# Patient Record
Sex: Female | Born: 1940 | Race: White | Hispanic: No | Marital: Married | Smoking: Former smoker
Health system: Southern US, Community
[De-identification: ages and names within clinical notes are randomized; demographics above are authoritative.]

## PROBLEM LIST (undated history)

## (undated) DIAGNOSIS — K529 Noninfective gastroenteritis and colitis, unspecified: Secondary | ICD-10-CM

## (undated) DIAGNOSIS — K589 Irritable bowel syndrome without diarrhea: Secondary | ICD-10-CM

## (undated) DIAGNOSIS — K649 Unspecified hemorrhoids: Secondary | ICD-10-CM

## (undated) DIAGNOSIS — M1990 Unspecified osteoarthritis, unspecified site: Secondary | ICD-10-CM

## (undated) DIAGNOSIS — G4700 Insomnia, unspecified: Secondary | ICD-10-CM

## (undated) DIAGNOSIS — M810 Age-related osteoporosis without current pathological fracture: Secondary | ICD-10-CM

## (undated) DIAGNOSIS — E0590 Thyrotoxicosis, unspecified without thyrotoxic crisis or storm: Secondary | ICD-10-CM

## (undated) DIAGNOSIS — E559 Vitamin D deficiency, unspecified: Secondary | ICD-10-CM

## (undated) DIAGNOSIS — E010 Iodine-deficiency related diffuse (endemic) goiter: Secondary | ICD-10-CM

## (undated) DIAGNOSIS — I5032 Chronic diastolic (congestive) heart failure: Secondary | ICD-10-CM

## (undated) DIAGNOSIS — I272 Pulmonary hypertension, unspecified: Secondary | ICD-10-CM

## (undated) DIAGNOSIS — A499 Bacterial infection, unspecified: Secondary | ICD-10-CM

## (undated) DIAGNOSIS — I4819 Other persistent atrial fibrillation: Secondary | ICD-10-CM

## (undated) DIAGNOSIS — I517 Cardiomegaly: Secondary | ICD-10-CM

## (undated) DIAGNOSIS — O039 Complete or unspecified spontaneous abortion without complication: Secondary | ICD-10-CM

## (undated) DIAGNOSIS — N390 Urinary tract infection, site not specified: Secondary | ICD-10-CM

## (undated) HISTORY — DX: Noninfective gastroenteritis and colitis, unspecified: K52.9

## (undated) HISTORY — DX: Iodine-deficiency related diffuse (endemic) goiter: E01.0

## (undated) HISTORY — PX: WISDOM TOOTH EXTRACTION: SHX21

## (undated) HISTORY — DX: Urinary tract infection, site not specified: N39.0

## (undated) HISTORY — DX: Irritable bowel syndrome, unspecified: K58.9

## (undated) HISTORY — PX: TONSILLECTOMY: SUR1361

## (undated) HISTORY — DX: Unspecified hemorrhoids: K64.9

## (undated) HISTORY — DX: Vitamin D deficiency, unspecified: E55.9

## (undated) HISTORY — PX: DILATION AND CURETTAGE OF UTERUS: SHX78

## (undated) HISTORY — PX: CATARACT EXTRACTION: SUR2

## (undated) HISTORY — DX: Cardiomegaly: I51.7

## (undated) HISTORY — PX: GANGLION CYST EXCISION: SHX1691

## (undated) HISTORY — DX: Complete or unspecified spontaneous abortion without complication: O03.9

## (undated) HISTORY — DX: Age-related osteoporosis without current pathological fracture: M81.0

## (undated) HISTORY — DX: Bacterial infection, unspecified: A49.9

---

## 1999-09-15 ENCOUNTER — Encounter: Admission: RE | Admit: 1999-09-15 | Discharge: 1999-09-15 | Payer: Self-pay | Admitting: Family Medicine

## 1999-09-15 ENCOUNTER — Encounter: Payer: Self-pay | Admitting: Family Medicine

## 2000-10-16 ENCOUNTER — Encounter: Payer: Self-pay | Admitting: Family Medicine

## 2000-10-16 ENCOUNTER — Encounter: Admission: RE | Admit: 2000-10-16 | Discharge: 2000-10-16 | Payer: Self-pay | Admitting: Family Medicine

## 2001-11-19 ENCOUNTER — Encounter: Admission: RE | Admit: 2001-11-19 | Discharge: 2001-11-19 | Payer: Self-pay | Admitting: Family Medicine

## 2001-11-19 ENCOUNTER — Encounter: Payer: Self-pay | Admitting: Family Medicine

## 2003-02-23 ENCOUNTER — Encounter: Payer: Self-pay | Admitting: Family Medicine

## 2003-02-23 ENCOUNTER — Encounter: Admission: RE | Admit: 2003-02-23 | Discharge: 2003-02-23 | Payer: Self-pay | Admitting: Family Medicine

## 2005-08-08 ENCOUNTER — Encounter: Admission: RE | Admit: 2005-08-08 | Discharge: 2005-08-08 | Payer: Self-pay | Admitting: Family Medicine

## 2011-10-31 ENCOUNTER — Other Ambulatory Visit: Payer: Self-pay | Admitting: Orthopedic Surgery

## 2011-11-07 ENCOUNTER — Encounter (HOSPITAL_BASED_OUTPATIENT_CLINIC_OR_DEPARTMENT_OTHER): Payer: Self-pay | Admitting: *Deleted

## 2011-11-07 NOTE — Progress Notes (Signed)
Healthy lady-no labs needed

## 2011-11-09 ENCOUNTER — Encounter (HOSPITAL_BASED_OUTPATIENT_CLINIC_OR_DEPARTMENT_OTHER): Payer: Self-pay | Admitting: *Deleted

## 2011-11-09 ENCOUNTER — Encounter (HOSPITAL_BASED_OUTPATIENT_CLINIC_OR_DEPARTMENT_OTHER)
Admission: RE | Disposition: A | Discharge status: Home / Self Care | Payer: Self-pay | Source: Ambulatory Visit | Attending: Orthopedic Surgery

## 2011-11-09 ENCOUNTER — Encounter (HOSPITAL_BASED_OUTPATIENT_CLINIC_OR_DEPARTMENT_OTHER): Payer: Self-pay | Admitting: Anesthesiology

## 2011-11-09 ENCOUNTER — Encounter (HOSPITAL_BASED_OUTPATIENT_CLINIC_OR_DEPARTMENT_OTHER): Payer: Self-pay | Admitting: Orthopedic Surgery

## 2011-11-09 ENCOUNTER — Ambulatory Visit (HOSPITAL_BASED_OUTPATIENT_CLINIC_OR_DEPARTMENT_OTHER)
Admission: RE | Admit: 2011-11-09 | Discharge: 2011-11-09 | Disposition: A | Discharge status: Home / Self Care | Payer: Medicare Other | Source: Ambulatory Visit | Attending: Orthopedic Surgery | Admitting: Orthopedic Surgery

## 2011-11-09 ENCOUNTER — Ambulatory Visit (HOSPITAL_BASED_OUTPATIENT_CLINIC_OR_DEPARTMENT_OTHER): Payer: Medicare Other | Admitting: Anesthesiology

## 2011-11-09 DIAGNOSIS — M546 Pain in thoracic spine: Secondary | ICD-10-CM | POA: Insufficient documentation

## 2011-11-09 HISTORY — PX: EAR CYST EXCISION: SHX22

## 2011-11-09 HISTORY — DX: Unspecified osteoarthritis, unspecified site: M19.90

## 2011-11-09 HISTORY — DX: Insomnia, unspecified: G47.00

## 2011-11-09 LAB — POCT HEMOGLOBIN-HEMACUE: Hemoglobin: 14.4 g/dL (ref 12.0–15.0)

## 2011-11-09 SURGERY — CYST REMOVAL
Anesthesia: General | Laterality: Right | Wound class: Clean

## 2011-11-09 MED ORDER — FENTANYL CITRATE 0.05 MG/ML IJ SOLN: 25.0000 ug | INTRAMUSCULAR | Status: DC | PRN

## 2011-11-09 MED ORDER — DEXAMETHASONE SODIUM PHOSPHATE 10 MG/ML IJ SOLN
INTRAMUSCULAR | Status: DC | PRN
  Administered 2011-11-09: 10 mg via INTRAVENOUS

## 2011-11-09 MED ORDER — PROPOFOL 10 MG/ML IV EMUL
INTRAVENOUS | Status: DC | PRN
  Administered 2011-11-09: 200 mg via INTRAVENOUS
  Administered 2011-11-09: 4 mg via INTRAVENOUS

## 2011-11-09 MED ORDER — METOCLOPRAMIDE HCL 5 MG/ML IJ SOLN: 10.0000 mg | Freq: Once | INTRAMUSCULAR | Status: DC | PRN

## 2011-11-09 MED ORDER — OXYCODONE HCL 5 MG PO TABS: 5.0000 mg | ORAL_TABLET | Freq: Once | ORAL | Status: DC | PRN

## 2011-11-09 MED ORDER — BUPIVACAINE HCL (PF) 0.25 % IJ SOLN
INTRAMUSCULAR | Status: DC | PRN
  Administered 2011-11-09: 10 mL

## 2011-11-09 MED ORDER — METOCLOPRAMIDE HCL 5 MG/ML IJ SOLN
INTRAMUSCULAR | Status: DC | PRN
  Administered 2011-11-09: 10 mg via INTRAVENOUS

## 2011-11-09 MED ORDER — CEFAZOLIN SODIUM 1-5 GM-% IV SOLN: 1.0000 g | INTRAVENOUS | Status: DC

## 2011-11-09 MED ORDER — LACTATED RINGERS IV SOLN
INTRAVENOUS | Status: DC
  Administered 2011-11-09 (×2): via INTRAVENOUS

## 2011-11-09 MED ORDER — LIDOCAINE HCL (CARDIAC) 20 MG/ML IV SOLN
INTRAVENOUS | Status: DC | PRN
  Administered 2011-11-09: 60 mg via INTRAVENOUS

## 2011-11-09 MED ORDER — MIDAZOLAM HCL 5 MG/5ML IJ SOLN
INTRAMUSCULAR | Status: DC | PRN
  Administered 2011-11-09: 2 mg via INTRAVENOUS

## 2011-11-09 MED ORDER — CHLORHEXIDINE GLUCONATE 4 % EX LIQD: 60.0000 mL | Freq: Once | CUTANEOUS | Status: DC

## 2011-11-09 MED ORDER — HYDROCODONE-ACETAMINOPHEN 5-325 MG PO TABS: ORAL_TABLET | ORAL | Status: DC

## 2011-11-09 MED ORDER — EPHEDRINE SULFATE 50 MG/ML IJ SOLN
INTRAMUSCULAR | Status: DC | PRN
  Administered 2011-11-09 (×2): 5 mg via INTRAVENOUS

## 2011-11-09 MED ORDER — FENTANYL CITRATE 0.05 MG/ML IJ SOLN
INTRAMUSCULAR | Status: DC | PRN
  Administered 2011-11-09: 50 ug via INTRAVENOUS

## 2011-11-09 MED ORDER — ACETAMINOPHEN 10 MG/ML IV SOLN
1000.0000 mg | Freq: Once | INTRAVENOUS | Status: AC
  Administered 2011-11-09: 1000 mg via INTRAVENOUS

## 2011-11-09 SURGICAL SUPPLY — 53 items
BANDAGE COBAN STERILE 2 (GAUZE/BANDAGES/DRESSINGS) IMPLANT
BANDAGE CONFORM 2  STR LF (GAUZE/BANDAGES/DRESSINGS) ×2 IMPLANT
BANDAGE ELASTIC 3 VELCRO ST LF (GAUZE/BANDAGES/DRESSINGS) IMPLANT
BANDAGE GAUZE ELAST BULKY 4 IN (GAUZE/BANDAGES/DRESSINGS) IMPLANT
BANDAGE GAUZE STRT 1 STR LF (GAUZE/BANDAGES/DRESSINGS) ×2 IMPLANT
BENZOIN TINCTURE PRP APPL 2/3 (GAUZE/BANDAGES/DRESSINGS) IMPLANT
BLADE MINI RND TIP GREEN BEAV (BLADE) IMPLANT
BLADE SURG 15 STRL LF DISP TIS (BLADE) ×2 IMPLANT
BLADE SURG 15 STRL SS (BLADE) ×2
BNDG CMPR 9X4 STRL LF SNTH (GAUZE/BANDAGES/DRESSINGS) ×1
BNDG CMPR MD 5X2 ELC HKLP STRL (GAUZE/BANDAGES/DRESSINGS)
BNDG COHESIVE 1X5 TAN STRL LF (GAUZE/BANDAGES/DRESSINGS) ×2 IMPLANT
BNDG ELASTIC 2 VLCR STRL LF (GAUZE/BANDAGES/DRESSINGS) IMPLANT
BNDG ESMARK 4X9 LF (GAUZE/BANDAGES/DRESSINGS) ×2 IMPLANT
BNDG PLASTER X FAST 3X3 WHT LF (CAST SUPPLIES) IMPLANT
BNDG PLSTR 9X3 FST ST WHT (CAST SUPPLIES)
CHLORAPREP W/TINT 26ML (MISCELLANEOUS) ×2 IMPLANT
CLOTH BEACON ORANGE TIMEOUT ST (SAFETY) ×2 IMPLANT
CORDS BIPOLAR (ELECTRODE) ×2 IMPLANT
COVER MAYO STAND STRL (DRAPES) ×2 IMPLANT
COVER TABLE BACK 60X90 (DRAPES) ×2 IMPLANT
CUFF TOURNIQUET SINGLE 18IN (TOURNIQUET CUFF) ×2 IMPLANT
DRAPE EXTREMITY T 121X128X90 (DRAPE) ×2 IMPLANT
DRAPE SURG 17X23 STRL (DRAPES) ×2 IMPLANT
GAUZE XEROFORM 1X8 LF (GAUZE/BANDAGES/DRESSINGS) ×2 IMPLANT
GLOVE BIO SURGEON STRL SZ 6.5 (GLOVE) ×2 IMPLANT
GLOVE BIO SURGEON STRL SZ7.5 (GLOVE) ×4 IMPLANT
GLOVE INDICATOR 7.0 STRL GRN (GLOVE) ×2 IMPLANT
GOWN PREVENTION PLUS XLARGE (GOWN DISPOSABLE) ×4 IMPLANT
GOWN STRL REIN XL XLG (GOWN DISPOSABLE) IMPLANT
NEEDLE HYPO 25X1 1.5 SAFETY (NEEDLE) ×2 IMPLANT
NS IRRIG 1000ML POUR BTL (IV SOLUTION) ×2 IMPLANT
PACK BASIN DAY SURGERY FS (CUSTOM PROCEDURE TRAY) ×2 IMPLANT
PAD CAST 3X4 CTTN HI CHSV (CAST SUPPLIES) IMPLANT
PAD CAST 4YDX4 CTTN HI CHSV (CAST SUPPLIES) IMPLANT
PADDING CAST ABS 4INX4YD NS (CAST SUPPLIES) ×1
PADDING CAST ABS COTTON 4X4 ST (CAST SUPPLIES) ×1 IMPLANT
PADDING CAST COTTON 3X4 STRL (CAST SUPPLIES)
PADDING CAST COTTON 4X4 STRL (CAST SUPPLIES)
SPLINT FNGR PLAIN END 5/8X3.25 (CAST SUPPLIES) ×1 IMPLANT
SPLINT PLASTALUME 3 1/4 (CAST SUPPLIES) ×2
SPONGE GAUZE 4X4 12PLY (GAUZE/BANDAGES/DRESSINGS) ×4 IMPLANT
STOCKINETTE 4X48 STRL (DRAPES) ×2 IMPLANT
STRIP CLOSURE SKIN 1/2X4 (GAUZE/BANDAGES/DRESSINGS) IMPLANT
SUT ETHILON 3 0 PS 1 (SUTURE) IMPLANT
SUT ETHILON 4 0 PS 2 18 (SUTURE) ×2 IMPLANT
SUT ETHILON 5 0 P 3 18 (SUTURE) ×1
SUT NYLON ETHILON 5-0 P-3 1X18 (SUTURE) ×1 IMPLANT
SYR BULB 3OZ (MISCELLANEOUS) ×2 IMPLANT
SYR CONTROL 10ML LL (SYRINGE) ×2 IMPLANT
TOWEL OR 17X24 6PK STRL BLUE (TOWEL DISPOSABLE) ×4 IMPLANT
UNDERPAD 30X30 INCONTINENT (UNDERPADS AND DIAPERS) ×2 IMPLANT
WATER STERILE IRR 1000ML POUR (IV SOLUTION) IMPLANT

## 2011-11-09 NOTE — Anesthesia Procedure Notes (Signed)
Procedure Name: LMA Insertion Date/Time: 11/09/2011 8:59 AM Performed by: Burna Cash Pre-anesthesia Checklist: Patient identified, Emergency Drugs available, Suction available and Patient being monitored Patient Re-evaluated:Patient Re-evaluated prior to inductionOxygen Delivery Method: Circle System Utilized Preoxygenation: Pre-oxygenation with 100% oxygen Intubation Type: IV induction Ventilation: Mask ventilation without difficulty LMA: LMA inserted LMA Size: 4.0 Number of attempts: 1 Airway Equipment and Method: bite block Placement Confirmation: positive ETCO2 Tube secured with: Tape Dental Injury: Teeth and Oropharynx as per pre-operative assessment

## 2011-11-09 NOTE — Op Note (Signed)
Dictation [phone]

## 2011-11-09 NOTE — Transfer of Care (Signed)
Immediate Anesthesia Transfer of Care Note  Patient: Lynn Hayden  Procedure(s) Performed: Procedure(s) (LRB): CYST REMOVAL (Right)  Patient Location: PACU  Anesthesia Type: General  Level of Consciousness: sedated  Airway & Oxygen Therapy: Patient Spontanous Breathing and Patient connected to face mask oxygen  Post-op Assessment: Report given to PACU RN and Post -op Vital signs reviewed and stable  Post vital signs: Reviewed and stable  Complications: No apparent anesthesia complications

## 2011-11-09 NOTE — Anesthesia Postprocedure Evaluation (Signed)
Anesthesia Post Note  Patient: Lynn Hayden  Procedure(s) Performed: Procedure(s) (LRB): CYST REMOVAL (Right)  Anesthesia type: General  Patient location: PACU  Post pain: Pain level controlled  Post assessment: Patient's Cardiovascular Status Stable  Last Vitals:  Filed Vitals:   11/09/11 1055  BP: 138/72  Pulse: 62  Temp: 36.4 C  Resp: 16    Post vital signs: Reviewed and stable  Level of consciousness: alert  Complications: No apparent anesthesia complications

## 2011-11-09 NOTE — Op Note (Signed)
NAME:  Lynn Hayden, Lynn Hayden                ACCOUNT NO.:  [id number]  MEDICAL RECORD NO.:  [id number]  LOCATION:                                 FACILITY:  PHYSICIAN:  Betha Loa, MD             DATE OF BIRTH:  DATE OF PROCEDURE:  11/09/2011 DATE OF DISCHARGE:                              OPERATIVE REPORT   PREOPERATIVE DIAGNOSIS:  Right index finger retained foreign body versus mucoid cyst.  POSTOPERATIVE DIAGNOSIS:  Right index finger mucoid cyst.  PROCEDURE:  Excision of mucoid cyst and debridement of DIP joint.  SURGEON:  Betha Loa, M.D.  ASSISTANT:  None.  ANESTHESIA:  General.  IV FLUIDS:  Per anesthesia flow sheet.  ESTIMATED BLOOD LOSS:  Minimal.  COMPLICATIONS:  None.  SPECIMENS:  Mucoid cyst to Pathology.  TOURNIQUET TIME:  36 minutes.  DISPOSITION:  Stable to PACU.  INDICATIONS:  Ms. Lynn Hayden is a 71 year old female who for approximately 10 months has had a sensation of foreign body in the index finger near the radial base of her nail.  She states that she poked it on the strainer under sink at one time and it has been aggravated since.  She is followed up in the office multiple times for evaluation and it shown an inflamed area of the cuticle.  Ultrasound and CT had been unsuccessful on revealing foreign body.  We discussed nonoperative and operative options.  She wished to have a surgical exploration for potential removal of foreign body versus mucoid cyst with debridement of the DIP joint.  Risks, benefits, and alternatives of surgery were discussed including the risk of blood loss; infection; damage to nerves, vessels, tendons, ligaments, bone; failure of procedure; need for additional procedure; complications with wound healing; continued pain; and retained foreign body.  She voiced understanding of these risks and elected to proceed.  OPERATIVE COURSE:  After being identified preoperatively by myself, the patient and I agreed upon the procedure and site  of procedure.  Surgical site was marked.  Risks, benefits, and alternatives of surgery were reviewed and she wished to proceed.  A surgical consent had been signed. She was given 1 g of IV Ancef as preoperative antibiotic prophylaxis due to her amoxicillin allergy.  She was transferred to the operating room and placed on the operating room table in supine position with right upper extremity on arm board.  General anesthesia was induced by the anesthesiologist.  Right upper extremity was prepped and draped in normal sterile orthopedic fashion.  A surgical pause was performed between the surgeons, anesthesia and operating room staff, and all were in agreement as to the patient, procedure, and site of procedure. Tourniquet at the proximal aspect of the forearm was inflated to 250 mmHg after exsanguination of the limb with Esmarch bandage.  A hockey- stick shaped incision was made at the DIP joint on the radial side. This carried into subcutaneous tissues by spreading technique.  Bipolar electrocautery was used to obtain hemostasis.  The tissues at the radial side of the distal phalanx were elevated.  They were very patulous within.  It appeared that they were cystic.  This tissue was  removed and sent to Pathology for examination.  No foreign body was able to be identified.  The extensor tendon was elevated.  The DIP joint was debrided using the small rongeurs.  There was a large osteophyte on the dorsum of the distal phalanx, it was removed.  The insertion of the extensor tendon was preserved.  The wounds and a joint were copiously irrigated with sterile saline.  It was closed with 5-0 nylon in an horizontal mattress fashion.  The finger was injected with 10 mL of 0.25% plain Marcaine as a digital block to aid in postoperative analgesia.  The wound was then dressed with sterile Xeroform, 4x4, and wrapped with a Kling.  An AlumaFoam splint was placed and wrapped with Kling and a Coban  dressing lightly.  Tourniquet was deflated at 36 minutes.  The operative drapes were broken down.  The patient was awoken from anesthesia safely.  She was transferred back to stretcher and taken to PACU in stable condition.  I will see her back in the office in 1 week for postoperative followup.  I will give her Norco 5/325, 1-2 p.o. q.6 hours p.r.n. pain, dispensed #40.     Betha Loa, MD     KK/MEDQ  D:  11/09/2011  T:  11/09/2011  Job:  [id number]

## 2011-11-09 NOTE — Anesthesia Preprocedure Evaluation (Signed)
Anesthesia Evaluation  Patient identified by MRN, date of birth, ID band Patient awake    Reviewed: Allergy & Precautions, H&P , NPO status , Patient's Chart, lab work & pertinent test results, reviewed documented beta blocker date and time   History of Anesthesia Complications (+) PONV  Airway Mallampati: II TM Distance: >3 FB Neck ROM: full    Dental   Pulmonary neg pulmonary ROS,          Cardiovascular negative cardio ROS      Neuro/Psych negative neurological ROS  negative psych ROS   GI/Hepatic negative GI ROS, Neg liver ROS,   Endo/Other  negative endocrine ROS  Renal/GU negative Renal ROS  negative genitourinary   Musculoskeletal   Abdominal   Peds  Hematology negative hematology ROS (+)   Anesthesia Other Findings See surgeon's H&P   Reproductive/Obstetrics negative OB ROS                           Anesthesia Physical Anesthesia Plan  ASA: I  Anesthesia Plan: General   Post-op Pain Management:    Induction: Intravenous  Airway Management Planned: LMA  Additional Equipment:   Intra-op Plan:   Post-operative Plan: Extubation in OR  Informed Consent: I have reviewed the patients History and Physical, chart, labs and discussed the procedure including the risks, benefits and alternatives for the proposed anesthesia with the patient or authorized representative who has indicated his/her understanding and acceptance.   Dental Advisory Given  Plan Discussed with: CRNA and Surgeon  Anesthesia Plan Comments:         Anesthesia Quick Evaluation  

## 2011-11-09 NOTE — Discharge Instructions (Addendum)

## 2011-11-09 NOTE — H&P (Signed)
  Lynn Hayden is an 71 y.o. female.   Chief Complaint: right index foreign body vs mucoid cyst HPI: 71 yo female with sensation of foreign body right index finger by nail.  Poked with piece of metal from sink strainer ten months ago.  Unable to locate foreign body on u/s or ct.  Continued pain in soft tissue at radial side of base of nail.  Past Medical History  Diagnosis Date  . PONV (postoperative nausea and vomiting)   . Insomnia   . Arthritis     Past Surgical History  Procedure Date  . Tonsillectomy   . Dilation and curettage of uterus   . Wisdom tooth extraction   . Ganglion cyst excision     rt wrist    No family history on file. Social History:  reports that she quit smoking about 23 years ago. She does not have any smokeless tobacco history on file. She reports that she drinks alcohol. She reports that she does not use illicit drugs.  Allergies:  Allergies  Allergen Reactions  . Amoxicillin Rash    Medications Prior to Admission  Medication Sig Dispense Refill  . calcium carbonate 1250 MG capsule Take 1,250 mg by mouth 2 (two) times daily with a meal.      . cholecalciferol (VITAMIN D) 1000 UNITS tablet Take 1,000 Units by mouth daily. Takes 2      . temazepam (RESTORIL) 30 MG capsule Take 30 mg by mouth at bedtime as needed.        Results for orders placed during the hospital encounter of 11/09/11 (from the past 48 hour(s))  POCT HEMOGLOBIN-HEMACUE     Status: Normal   Collection Time   11/09/11  8:16 AM      Component Value Range Comment   Hemoglobin 14.4  12.0 - 15.0 (g/dL)     No results found.   A comprehensive review of systems was negative except for: Eyes: positive for contacts/glasses Ears, nose, mouth, throat, and face: positive for tinnitus  Blood pressure 159/83, pulse 58, temperature 97.6 F (36.4 C), temperature source Oral, resp. rate 16, height 5\' 4"  (1.626 m), weight 54.432 kg (120 lb), SpO2 99.00%.  General appearance: alert,  cooperative and appears stated age Head: Normocephalic, without obvious abnormality, atraumatic Neck: supple, symmetrical, trachea midline Resp: clear to auscultation bilaterally Cardio: regular rate and rhythm GI: soft, non-tender; bowel sounds normal; no masses,  no organomegaly Extremities: light touch sensation and capillary refill intact all digits.  +epl/fpl/io.  radial side of right index by base of nail inflammed. Pulses: 2+ and symmetric Skin: Skin color, texture, turgor normal. No rashes or lesions Neurologic: Grossly normal Incision/Wound: na  Assessment/Plan Discussed possibility of retained foreign body vs mucoid cyst and non operative and operative treatment options.  She wishes to have operative treatment for attempted removal of foreign body vs mucoid cyst with dip joint debridement.  Risks, benefits, and alternatives of surgery were discussed and the patient agrees with the plan of care.   Amrom Ore R 11/09/2011, 8:46 AM

## 2011-11-10 ENCOUNTER — Encounter (HOSPITAL_BASED_OUTPATIENT_CLINIC_OR_DEPARTMENT_OTHER): Payer: Self-pay | Admitting: Orthopedic Surgery

## 2011-11-10 NOTE — Brief Op Note (Signed)
Follow up phone call made. Pt c/o sore on upper lip from intubation yesterday and want to know if she could treat it with salt like she does when she gets a cold sore which works for her. I said that would be ok and offered to have one the anesthesia doctors call, she said they was no need of that.

## 2011-11-15 ENCOUNTER — Encounter (HOSPITAL_BASED_OUTPATIENT_CLINIC_OR_DEPARTMENT_OTHER): Payer: Self-pay

## 2012-03-21 ENCOUNTER — Ambulatory Visit: Payer: Medicare Other | Admitting: Cardiology

## 2012-03-22 ENCOUNTER — Ambulatory Visit (INDEPENDENT_AMBULATORY_CARE_PROVIDER_SITE_OTHER): Payer: Medicare Other | Admitting: Cardiology

## 2012-03-22 ENCOUNTER — Encounter: Payer: Self-pay | Admitting: Cardiology

## 2012-03-22 VITALS — BP 141/75 | HR 57 | Ht 64.0 in | Wt 119.8 lb

## 2012-03-22 DIAGNOSIS — R0789 Other chest pain: Secondary | ICD-10-CM

## 2012-03-22 DIAGNOSIS — R079 Chest pain, unspecified: Secondary | ICD-10-CM | POA: Insufficient documentation

## 2012-03-22 DIAGNOSIS — R011 Cardiac murmur, unspecified: Secondary | ICD-10-CM | POA: Insufficient documentation

## 2012-03-22 NOTE — Patient Instructions (Addendum)
Your physician recommends that you continue on your current medications as directed. Please refer to the Current Medication list given to you today.  Your physician has requested that you have an exercise tolerance test POET. For further information please visit [email or web address] Please also follow instruction sheet, as given.

## 2012-03-22 NOTE — Progress Notes (Signed)
HPI Murmur, no diastolic murmurs the patient presents for evaluation of chest discomfort and a heart murmur. She had an evaluation for this several years ago apparently with an echocardiogram that demonstrated no significant abnormalities and she reports an exercise treadmill test. She does get discomfort with activity such as playing basketball or walking up an incline. This is a left sided discomfort without radiation to the jaw or to the arms. There are no associated symptoms. He is a stable pattern. She has no shortness of breath, PND or orthopnea. She does not work palpitations, presyncope or syncope.  Allergies  Allergen Reactions  . Amoxicillin Rash    Current Outpatient Prescriptions  Medication Sig Dispense Refill  . calcium carbonate 1250 MG capsule Take 1,250 mg by mouth 2 (two) times daily with a meal.      . cholecalciferol (VITAMIN D) 1000 UNITS tablet Take 1,000 Units by mouth daily. Takes 2      . temazepam (RESTORIL) 30 MG capsule Take 30 mg by mouth at bedtime as needed.        Past Medical History  Diagnosis Date  . PONV (postoperative nausea and vomiting)   . Insomnia   . Arthritis     Past Surgical History  Procedure Date  . Tonsillectomy   . Dilation and curettage of uterus   . Wisdom tooth extraction   . Ganglion cyst excision     rt wrist  . Ear cyst excision 11/09/2011    Procedure: CYST REMOVAL;  Surgeon: Tami Ribas, MD;  Location: [facility];  Service: Orthopedics;  Laterality: Right;  right index excision cyst/foreign body and debridement DIP joint    Family history:  No early CAD  History   Social History  . Marital Status: Married    Spouse Name: N/A    Number of Children: N/A  . Years of Education: N/A   Occupational History  . Not on file.   Social History Main Topics  . Smoking status: Former Smoker    Quit date: 11/06/1988  . Smokeless tobacco: Not on file  . Alcohol Use: Yes     rare  . Drug Use: No  .  Sexually Active:    Other Topics Concern  . Not on file   Social History Narrative  . No narrative on file    ROS:   As stated in the HPI and negative for all other systems.  PHYSICAL EXAM BP 141/75  Pulse 57  Ht 5\' 4"  (1.626 m)  Wt 119 lb 12.8 oz (54.341 kg)  BMI 20.56 kg/m2 GENERAL:  Well appearing HEENT:  Pupils equal round and reactive, fundi not visualized, oral mucosa unremarkable NECK:  No jugular venous distention, waveform within normal limits, carotid upstroke brisk and symmetric, no bruits, no thyromegaly LYMPHATICS:  No cervical, inguinal adenopathy LUNGS:  Clear to auscultation bilaterally BACK:  No CVA tenderness CHEST:  Unremarkable HEART:  PMI not displaced or sustained,S1 and S2 within normal limits, no S3, no S4, no clicks, no rubs, soft apical and left upper sternal border early peaking murmurs ABD:  Flat, positive bowel sounds normal in frequency in pitch, no bruits, no rebound, no guarding, no midline pulsatile mass, no hepatomegaly, no splenomegaly EXT:  2 plus pulses throughout, no edema, no cyanosis no clubbing SKIN:  No rashes no nodules NEURO:  Cranial nerves II through XII grossly intact, motor grossly intact throughout PSYCH:  Cognitively intact, oriented to person place and time  EKG:  Normal  sinus rhythm, rate 57, left axis deviation, no acute ST T wave changes.  03/22/2012  ASSESSMENT AND PLAN  Chest pain - This has some typical features but it is unchanged from discomfort she had several years ago.   I will bring the patient back for a POET (Plain Old Exercise Test). This will allow me to screen for obstructive coronary disease, risk stratify and very importantly provide a prescription for exercise.  Murmur - This sounds like mild aortic sclerosis. I would not suspect any pathologic or hemodynamically significant valvular problems or dynamic outflow obstruction. Therefore, no further cardiovascular imaging is indicated.  Hypertension - Her  blood pressure is elevated today but this is unusual. No change in therapy is indicated.

## 2012-03-25 ENCOUNTER — Ambulatory Visit (INDEPENDENT_AMBULATORY_CARE_PROVIDER_SITE_OTHER): Payer: Medicare Other | Admitting: Physician Assistant

## 2012-03-25 DIAGNOSIS — R0789 Other chest pain: Secondary | ICD-10-CM

## 2012-03-25 NOTE — Procedures (Signed)
Exercise Treadmill Test  Pre-Exercise Testing Evaluation Rhythm: normal sinus  Rate: 68   PR:  .16 QRS:  .07  QT:  .40 QTc: .42     Test  Exercise Tolerance Test Ordering MD: Angelina Sheriff, MD  Interpreting MD: Tereso Newcomer , PA-C  Unique Test No: 1  Treadmill:  1  Indication for ETT: chest pain - rule out ischemia  Contraindication to ETT: No   Stress Modality: exercise - treadmill  Cardiac Imaging Performed: non   Protocol: standard Bruce - maximal  Max BP: 188/92  Max MPHR (bpm):  149 85% MPR (bpm):  127  MPHR obtained (bpm):  151 % MPHR obtained:  102%  Reached 85% MPHR (min:sec):  3:27 Total Exercise Time (min-sec):  6:00  Workload in METS:  7.0 Borg Scale: 15  Reason ETT Terminated:  patient's desire to stop    ST Segment Analysis At Rest: non-specific ST segment slurring With Exercise: non-specific ST changes  Other Information Arrhythmia:  Yes Angina during ETT:  present (1) Quality of ETT:  diagnostic  ETT Interpretation:  normal - no evidence of ischemia by ST analysis  Comments: Fair exercise tolerance. Patient did complain of chest tightness. Normal BP response to exercise. She did have a run of ? ATach in recovery with HR up to 150s (brief) No ST-T changes to suggest ischemia.   Recommendations: Consider event monitor. Results d/w Eilene M Welden. Follow up with Dr. Rollene Rotunda as directed. Signed, Tereso Newcomer, PA-C  10:15 AM 03/25/2012

## 2012-03-28 ENCOUNTER — Ambulatory Visit: Payer: Medicare Other | Admitting: Cardiology

## 2012-04-02 ENCOUNTER — Telehealth: Payer: Self-pay | Admitting: Cardiology

## 2012-04-02 NOTE — Telephone Encounter (Signed)
Looks like Dr. Antoine Poche had ordered GXT on 10/18 which was done w/Scott W. PAC on 10/2.  I s/w pt about GXT results and stated that it looked like Lynn Hayden, [facility] already gone over results w/her, see GXT recommendations from [place] W. PAC. Pt said yes but did not kow if there was anything else. Pt has appt w/[facility] 04/29/12 @ 9:45, no monitor needed per conversation Tereso Newcomer, PAC had with  Dr. Antoine Poche. Pt said thank you and that she will see Dr. Antoine Poche 04/29/12. I will let Dr. Jenene Slicker RN Elita Quick know of this today.

## 2012-04-02 NOTE — Telephone Encounter (Signed)
Pt calling for results of stress test °

## 2012-04-02 NOTE — Telephone Encounter (Signed)
According to results of GXT with Tereso Newcomer, PA pt was to have possibly had an event monitor.  Pt is now calling for results of his stress test.  Will forward to Tereso Newcomer, PA for recommendations and orders.

## 2012-04-29 ENCOUNTER — Ambulatory Visit: Payer: Medicare Other | Admitting: Cardiology

## 2014-05-12 ENCOUNTER — Encounter: Payer: Self-pay | Admitting: Cardiology

## 2014-05-12 ENCOUNTER — Ambulatory Visit (INDEPENDENT_AMBULATORY_CARE_PROVIDER_SITE_OTHER): Payer: Medicare Other | Admitting: Cardiology

## 2014-05-12 VITALS — BP 132/80 | HR 66 | Ht 64.0 in | Wt 124.0 lb

## 2014-05-12 DIAGNOSIS — R9431 Abnormal electrocardiogram [ECG] [EKG]: Secondary | ICD-10-CM

## 2014-05-12 DIAGNOSIS — R079 Chest pain, unspecified: Secondary | ICD-10-CM

## 2014-05-12 DIAGNOSIS — E049 Nontoxic goiter, unspecified: Secondary | ICD-10-CM

## 2014-05-12 NOTE — Patient Instructions (Signed)
Your physician recommends that you schedule a follow-up appointment in: as needed  We are ordering an echo  We are ordering a thyroid ultrasound

## 2014-05-12 NOTE — Progress Notes (Signed)
HPI Murmur, no diastolic murmurs the patient presents for evaluation of chest discomfort.   I saw her previously. She had some atypical pain in 2013 and negative POET (Plain Old Exercise Treadmill).   Since that time she has done well with some continued chest discomfort with exertion that was a stable pattern and unchanged. However, last month [place] died [place] The patient was quite distressed. She had days of chest discomfort. The burning discomfort. It would seem to come and go somewhat. He was taking multiple antacids without relief. She had no radiation to her arm or her neck. She hasn't had this kind of discomfort before. It was moderate in intensity. She did not describe associated symptoms.   The patient has had no discomfort in the last week as it finally went away.    Allergies  Allergen Reactions  . Amoxicillin Rash    Current Outpatient Prescriptions  Medication Sig Dispense Refill  . calcium carbonate 1250 MG capsule Take 1,250 mg by mouth 2 (two) times daily with a meal.    . cholecalciferol (VITAMIN D) 1000 UNITS tablet Take 1,000 Units by mouth daily. Takes 2    . diphenoxylate-atropine (LOMOTIL) 2.5-0.025 MG per tablet Take 2.5 tablets by mouth as needed.  2  . temazepam (RESTORIL) 30 MG capsule Take 30 mg by mouth at bedtime as needed.     No current facility-administered medications for this visit.    Past Medical History  Diagnosis Date  . Insomnia   . Arthritis     Past Surgical History  Procedure Laterality Date  . Tonsillectomy    . Dilation and curettage of uterus    . Wisdom tooth extraction    . Ganglion cyst excision      rt wrist  . Ear cyst excision  11/09/2011    Procedure: CYST REMOVAL;  Surgeon: Tami RibasKevin R Kuzma, MD;  Location: [facility];  Service: Orthopedics;  Laterality: Right;  right index excision cyst/foreign body and debridement DIP joint    ROS:   As stated in the HPI and negative for all other  systems.  PHYSICAL EXAM BP 132/80 mmHg  Pulse 66  Ht 5\' 4"  (1.626 m)  Wt 124 lb (56.246 kg)  BMI 21.27 kg/m2 GENERAL:  Well appearing HEENT:  Pupils equal round and reactive, fundi not visualized, oral mucosa unremarkable NECK:  No jugular venous distention, waveform within normal limits, carotid upstroke brisk and symmetric, no bruits, positive thyromegaly  Without discrete nodules LYMPHATICS:  No cervical, inguinal adenopathy LUNGS:  Clear to auscultation bilaterally BACK:  No CVA tenderness CHEST:  Unremarkable HEART:  PMI not displaced or sustained,S1 and S2 within normal limits, no S3, no S4, no clicks, no rubs, soft apical and left upper sternal border early peaking murmurs ABD:  Flat, positive bowel sounds normal in frequency in pitch, no bruits, no rebound, no guarding, no midline pulsatile mass, no hepatomegaly, no splenomegaly EXT:  2 plus pulses throughout, no edema, no cyanosis no clubbing SKIN:  No rashes no nodules NEURO:  Cranial nerves II through XII grossly intact, motor grossly intact throughout PSYCH:  Cognitively intact, oriented to person place and time  EKG:  Normal sinus rhythm, rate 66, left axis deviation,  Anterolateral T-wave inversions new from previous EKG , poor anterior R wave progression.  05/12/2014  ASSESSMENT AND PLAN  Chest pain/abnormal EKG -  The patient had an event last month when she was having pain. I'm going  to start with an echocardiogram. If this is normal she will need a stress perfusion study because of the abnormal EKG. If it is abnormal she will likely need a cardiac catheterization.  Murmur - This sounds like mild aortic sclerosis.  This will be evaluated in the echo above.  Thyromegaly -  the patient was supposed to have a thyroid study a few years ago because of this. I again noticed on physical exam and she agrees this time to have a thyroid ultrasound.

## 2014-05-15 ENCOUNTER — Ambulatory Visit (HOSPITAL_COMMUNITY): Payer: Medicare Other

## 2014-05-21 ENCOUNTER — Ambulatory Visit (HOSPITAL_COMMUNITY)
Admission: RE | Admit: 2014-05-21 | Discharge: 2014-05-21 | Disposition: A | Discharge status: Home / Self Care | Payer: Medicare Other | Source: Ambulatory Visit | Attending: Cardiovascular Disease | Admitting: Cardiovascular Disease

## 2014-05-21 DIAGNOSIS — I359 Nonrheumatic aortic valve disorder, unspecified: Secondary | ICD-10-CM

## 2014-05-21 DIAGNOSIS — R9431 Abnormal electrocardiogram [ECG] [EKG]: Secondary | ICD-10-CM

## 2014-05-21 DIAGNOSIS — R079 Chest pain, unspecified: Secondary | ICD-10-CM

## 2014-05-21 NOTE — Progress Notes (Signed)
2D Echo Performed 05/21/2014    Niani Mourer, RCS  

## 2014-05-25 ENCOUNTER — Telehealth: Payer: Self-pay | Admitting: Cardiology

## 2014-05-25 ENCOUNTER — Other Ambulatory Visit (HOSPITAL_BASED_OUTPATIENT_CLINIC_OR_DEPARTMENT_OTHER): Payer: Self-pay | Admitting: Family Medicine

## 2014-05-25 DIAGNOSIS — E010 Iodine-deficiency related diffuse (endemic) goiter: Secondary | ICD-10-CM

## 2014-05-25 NOTE — Telephone Encounter (Signed)
Pt called in stating that she had an Echo done on 12/17 and she would like to know what her results were. Please call  Thanks

## 2014-05-25 NOTE — Telephone Encounter (Signed)
Pt asking for results

## 2014-05-26 ENCOUNTER — Other Ambulatory Visit: Payer: Self-pay | Admitting: *Deleted

## 2014-05-26 ENCOUNTER — Ambulatory Visit (HOSPITAL_BASED_OUTPATIENT_CLINIC_OR_DEPARTMENT_OTHER)
Admission: RE | Admit: 2014-05-26 | Discharge: 2014-05-26 | Disposition: A | Discharge status: Home / Self Care | Payer: Medicare Other | Source: Ambulatory Visit | Attending: Family Medicine | Admitting: Family Medicine

## 2014-05-26 DIAGNOSIS — E049 Nontoxic goiter, unspecified: Secondary | ICD-10-CM | POA: Insufficient documentation

## 2014-05-26 DIAGNOSIS — R931 Abnormal findings on diagnostic imaging of heart and coronary circulation: Secondary | ICD-10-CM

## 2014-05-26 DIAGNOSIS — E010 Iodine-deficiency related diffuse (endemic) goiter: Secondary | ICD-10-CM

## 2014-05-26 NOTE — Telephone Encounter (Signed)
Results given.

## 2014-06-16 ENCOUNTER — Telehealth (HOSPITAL_COMMUNITY): Payer: Self-pay

## 2014-06-16 NOTE — Telephone Encounter (Signed)
Encounter complete. 

## 2014-06-17 ENCOUNTER — Telehealth (HOSPITAL_COMMUNITY): Payer: Self-pay

## 2014-06-17 NOTE — Telephone Encounter (Signed)
Encounter complete. 

## 2014-06-18 ENCOUNTER — Ambulatory Visit (HOSPITAL_COMMUNITY)
Admission: RE | Admit: 2014-06-18 | Discharge: 2014-06-18 | Disposition: A | Discharge status: Home / Self Care | Payer: Medicare HMO | Source: Ambulatory Visit | Attending: Cardiology | Admitting: Cardiology

## 2014-06-18 DIAGNOSIS — Z87891 Personal history of nicotine dependence: Secondary | ICD-10-CM | POA: Diagnosis not present

## 2014-06-18 DIAGNOSIS — R079 Chest pain, unspecified: Secondary | ICD-10-CM

## 2014-06-18 DIAGNOSIS — R931 Abnormal findings on diagnostic imaging of heart and coronary circulation: Secondary | ICD-10-CM | POA: Diagnosis not present

## 2014-06-18 MED ORDER — REGADENOSON 0.4 MG/5ML IV SOLN
0.4000 mg | Freq: Once | INTRAVENOUS | Status: AC
  Administered 2014-06-18: 0.4 mg via INTRAVENOUS

## 2014-06-18 MED ORDER — TECHNETIUM TC 99M SESTAMIBI GENERIC - CARDIOLITE
10.2000 | Freq: Once | INTRAVENOUS | Status: AC | PRN
  Administered 2014-06-18: 10 via INTRAVENOUS

## 2014-06-18 MED ORDER — TECHNETIUM TC 99M SESTAMIBI GENERIC - CARDIOLITE
31.0000 | Freq: Once | INTRAVENOUS | Status: AC | PRN
  Administered 2014-06-18: 31 via INTRAVENOUS

## 2014-06-18 NOTE — Procedures (Addendum)
[facility] [address] [place] [id number] [phone]  Cardiology Nuclear Med Study  Lynn AshRhea M Hayden is a [age] y.o. female     MRN : [id number]     DOB: 1940-10-20  Procedure Date: 06/18/2014  Nuclear Med Background Indication for Stress Test:  Evaluation for Ischemia and Abnormal EKG History:  Murmur;No further cardiac history reported;No prior respiratory history reported;No prior NUC MPI for comparison/ Cardiac Risk Factors: History of Smoking  Symptoms:  Chest Pain   Nuclear Pre-Procedure Caffeine/Decaff Intake:  8:00pm NPO After: 6:00am   IV Site: R Forearm  IV 0.9% NS with Angio Cath:  22g  Chest Size (in):  n/a IV Started by: Berdie OgrenAmanda Wease, RN  Height: 5\' 4"  (1.626 m)  Cup Size: B  BMI:  Body mass index is 21.27 kg/(m^2). Weight:  124 lb (56.246 kg)   Tech Comments:  n/a    Nuclear Med Study 1 or 2 day study: 1 day  Stress Test Type:  Lexiscan  Order Authorizing Provider:  Rollene RotundaJames Hochrein, MD   Resting Radionuclide: Technetium 5381m Sestamibi  Resting Radionuclide Dose: 10.2 mCi   Stress Radionuclide:  Technetium 3081m Sestamibi  Stress Radionuclide Dose: 31.0 mCi           Stress Protocol Rest HR: 67 Stress HR: 91  Rest BP: 157/85 Stress BP: 158/84  Exercise Time (min): n/a METS: n/a   Predicted Max HR: 147 bpm % Max HR: 66.67 bpm Rate Pressure Product: [id number]  Dose of Adenosine (mg):  n/a Dose of Lexiscan: 0.4 mg  Dose of Atropine (mg): n/a Dose of Dobutamine: n/a mcg/kg/min (at max HR)  Stress Test Technologist: Esperanza Sheetserry-Marie Martin, CCT Nuclear Technologist: Gonzella LexPam Phillips, CNMT   Rest Procedure:  Myocardial perfusion imaging was performed at rest 45 minutes following the intravenous administration of Technetium 5981m Sestamibi. Stress Procedure:  The patient received IV Lexiscan 0.4 mg over 15-seconds.  Technetium 2381m Sestamibi injected IV at 30-seconds.  There were no significant changes with Lexiscan.   Quantitative spect images were obtained after a 45 minute delay.  Transient Ischemic Dilatation (Normal <1.22):  1.05  QGS EDV:  58 ml QGS ESV:  14 ml LV Ejection Fraction: 76%  Rest ECG: NSR - Normal EKG  Stress ECG: No significant change from baseline ECG  QPS Raw Data Images:  Normal; no motion artifact; normal heart/lung ratio. Stress Images:  Normal homogeneous uptake in all areas of the myocardium. Rest Images:  Normal homogeneous uptake in all areas of the myocardium. Subtraction (SDS):  No evidence of ischemia.  Impression Exercise Capacity:  Lexiscan with no exercise. BP Response:  Normal blood pressure response. Clinical Symptoms:  No significant symptoms noted. ECG Impression:  No significant ECG changes with Lexiscan. Comparison with Prior Nuclear Study: No previous nuclear study performed  Overall Impression:  Normal stress nuclear study.  LV Wall Motion:  NL LV Function; NL Wall Motion; LVEF 76%  Chrystie NoseKenneth C. Birgitta Uhlir, MD, [facility] HospitalFACC Board Certified in Nuclear Cardiology Attending Cardiologist [facility]   [name],[name], MD  06/18/2014 4:51 PM

## 2014-07-01 ENCOUNTER — Ambulatory Visit (INDEPENDENT_AMBULATORY_CARE_PROVIDER_SITE_OTHER): Payer: Managed Care, Other (non HMO)

## 2014-07-01 ENCOUNTER — Ambulatory Visit (INDEPENDENT_AMBULATORY_CARE_PROVIDER_SITE_OTHER): Payer: Managed Care, Other (non HMO) | Admitting: Nurse Practitioner

## 2014-07-01 ENCOUNTER — Encounter: Payer: Self-pay | Admitting: Nurse Practitioner

## 2014-07-01 VITALS — BP 150/84 | HR 66 | Temp 97.8°F | Ht 64.0 in | Wt 122.0 lb

## 2014-07-01 DIAGNOSIS — M545 Low back pain, unspecified: Secondary | ICD-10-CM

## 2014-07-01 DIAGNOSIS — M438X4 Other specified deforming dorsopathies, thoracic region: Secondary | ICD-10-CM

## 2014-07-01 DIAGNOSIS — E049 Nontoxic goiter, unspecified: Secondary | ICD-10-CM

## 2014-07-01 DIAGNOSIS — E010 Iodine-deficiency related diffuse (endemic) goiter: Secondary | ICD-10-CM

## 2014-07-01 DIAGNOSIS — G4700 Insomnia, unspecified: Secondary | ICD-10-CM

## 2014-07-01 DIAGNOSIS — M5489 Other dorsalgia: Secondary | ICD-10-CM

## 2014-07-01 LAB — CBC WITH DIFFERENTIAL/PLATELET
BASOS ABS: 0 10*3/uL (ref 0.0–0.1)
BASOS PCT: 0.3 % (ref 0.0–3.0)
EOS ABS: 0.1 10*3/uL (ref 0.0–0.7)
EOS PCT: 1.1 % (ref 0.0–5.0)
HEMATOCRIT: 39.3 % (ref 36.0–46.0)
Hemoglobin: 13.1 g/dL (ref 12.0–15.0)
LYMPHS PCT: 15.4 % (ref 12.0–46.0)
Lymphs Abs: 1.2 10*3/uL (ref 0.7–4.0)
MCHC: 33.4 g/dL (ref 30.0–36.0)
MCV: 88.1 fl (ref 78.0–100.0)
MONO ABS: 0.4 10*3/uL (ref 0.1–1.0)
MONOS PCT: 5.6 % (ref 3.0–12.0)
NEUTROS ABS: 6.1 10*3/uL (ref 1.4–7.7)
NEUTROS PCT: 77.6 % — AB (ref 43.0–77.0)
PLATELETS: 266 10*3/uL (ref 150.0–400.0)
RBC: 4.45 Mil/uL (ref 3.87–5.11)
RDW: 14.4 % (ref 11.5–15.5)
WBC: 7.8 10*3/uL (ref 4.0–10.5)

## 2014-07-01 LAB — TSH: TSH: 1.92 u[IU]/mL (ref 0.35–4.50)

## 2014-07-01 LAB — T4, FREE: FREE T4: 0.89 ng/dL (ref 0.60–1.60)

## 2014-07-01 MED ORDER — TEMAZEPAM 15 MG PO CAPS: 30.0000 mg | ORAL_CAPSULE | Freq: Every evening | ORAL | Status: DC | PRN

## 2014-07-01 MED ORDER — HYDROCODONE-ACETAMINOPHEN 5-325 MG PO TABS
1.0000 | ORAL_TABLET | Freq: Every evening | ORAL | Status: DC | PRN
Start: 2014-07-01 — End: 2014-12-14

## 2014-07-01 NOTE — Progress Notes (Signed)
Subjective:     Lynn Hayden is a 74 y.o. female and is here to establish care. The patient reports problems - new onset Left-sided back pain times 1 week.  . Pain started when she helped husband lift a treadmill. She heard "pop" in low back & felt pain. Pain has been moderate to mild & is relieved by tylenol. Pain has gradually improved through the week, but interrupts sleep. She denies, weakness, paresthesia & radiating pain.  She wonders if change in bowel habits are r/t back injury: she has Hx of chronic diarrhea since childhood requiring lomotil several times week. Since she injured back, she has developed constipation. She took laxative twice w/small BM result 2 days ago. This morning she had another small formed BM. Only med changes are daily tylenol since injury. She reports external hemorrhoids w/occasional red blood on tissue. She has never had colonoscopy & declines exam stating " I never want one". She is requesting refill on temazepam 15 mg. She takes QHS to help with insomnia. She reports she doesn't sleep if she doesn't take medication. She declines further MMG. She has Hx osteopenia: took evista for 4 yrs, then fosamax for 5 yrs. Last Dexa 2014, pt states thinning had progressed. She had recent US neck to eval enlarged thyroid. Nml results. She was recently treated for UTI-had to have cirpo, bactrim & "probiotic" to clear infection. Urine Cx 06/02/14: "mixed flora". [facility]: A1C 5.8, BMET nml, urine micro & cx nml. Labs dated 06/17/14.   History   Social History  . Marital Status: Married    Spouse Name: N/A    Number of Children: 3  . Years of Education: N/A   Occupational History  .      retired   Social History Main Topics  . Smoking status: Former Smoker    Quit date: 11/06/1988  . Smokeless tobacco: Not on file  . Alcohol Use: Yes     Comment: rare  . Drug Use: No  . Sexual Activity: Not Currently   Other Topics Concern  . Not on file    Social History Narrative   Lives with husband.  Has 2 biological children & 1 adopted child.   Health Maintenance  Topic Date Due  . MAMMOGRAM  07/02/2019 (Originally 07/01/2008)  . COLONOSCOPY  07/02/2019 (Originally 09/28/1990)  . ZOSTAVAX  07/02/2019 (Originally 09/27/2000)  . INFLUENZA VACCINE  01/04/2015  . TETANUS/TDAP  07/02/2019  . DEXA SCAN  Completed  . PNEUMOCOCCAL POLYSACCHARIDE VACCINE AGE 43 AND OVER  Addressed    The following portions of the patient's history were reviewed and updated as appropriate: allergies, current medications, past family history, past medical history, past social history, past surgical history and problem list.  Review of Systems Constitutional: negative for fatigue and fevers Cardiovascular: positive for recent chest discomfort after sister's death. Cardiology w/u nml. reviewed records., negative for irregular heart beat Gastrointestinal: negative for reflux symptoms and weight loss Musculoskeletal:negative for muscle weakness Neurological: negative for gait problems and paresthesia   Objective:    BP 150/84 mmHg  Pulse 66  Temp(Src) 97.8 F (36.6 C) (Temporal)  Ht  (1.626 m)  Wt 122 lb (55.339 kg)  BMI 20.93 kg/m2  SpO2 100% General appearance: alert, cooperative, appears stated age and no distress Head: Normocephalic, without obvious abnormality, atraumatic Eyes: negative findings: lids and lashes normal and conjunctivae and sclerae normal Neck: no adenopathy, no carotid bruit, supple, symmetrical, trachea midline and thyroid: enlarged and US  neck 1 mo ago was nml. Reviewed record. Back: mild kyphosis. L SIJ & lumbar spine tenderness on exam. NROM w/lateral extension & forward flexion. Lungs: clear to auscultation bilaterally Heart: regular rate and rhythm, S1, S2 normal, no murmur, click, rub or gallop Extremities: extremities normal, atraumatic, no cyanosis or edema Pulses: 2+ and symmetric    Assessment:Plan  1.  Thyromegaly nml US, reviewed scan results. - T4, free - Thyroid peroxidase antibody - TSH - CBC with Differential/Platelet  2. Lumbosacral pain Possible vertebral fracture - DG Lumbar Spine Complete; Future - HYDROcodone-acetaminophen (NORCO/VICODIN) 5-325 MG per tablet; Take 1 tablet by mouth at bedtime as needed for moderate pain.  Dispense: 15 tablet; Refill: 0  3. Insomnia Continue. Advised not to take w/hydrocodone. - temazepam (RESTORIL) 15 MG capsule; Take 2 capsules (30 mg total) by mouth at bedtime as needed.  Dispense: 30 capsule; Refill: 3  Will request historical records from [facility]. See pt instructions F/u 2 weeks.

## 2014-07-01 NOTE — Progress Notes (Signed)
Pre visit review using our clinic review tool, if applicable. No additional management support is needed unless otherwise documented below in the visit note. 

## 2014-07-01 NOTE — Patient Instructions (Addendum)
Please get xray. My office will call with results.  Continue to use tylenol: up to 1000 mg twice daily. Use heat for 10 minutes 3 times daily.  Use narcotic pain medicine at night only. Do not take temazepam with pain medicine.  I will see you in 2 weeks or sooner if your symptoms worsen.  Very nice to meet you!

## 2014-07-02 ENCOUNTER — Telehealth: Payer: Self-pay | Admitting: Nurse Practitioner

## 2014-07-02 LAB — THYROID PEROXIDASE ANTIBODY: Thyroperoxidase Ab SerPl-aCnc: 1 IU/mL (ref <9)

## 2014-07-02 NOTE — Telephone Encounter (Signed)
Thyroid function is nml. Lumbar xrays show T12 compression fracture-not sure if new or old.Pt has no radicular symptoms. Does not want to see ortho. Agrees to call me if symptoms change & I will see her in ofc in 1 week. Will ref to ortho for further eval of vertebral fx if pain not resolving.

## 2014-07-08 ENCOUNTER — Ambulatory Visit (INDEPENDENT_AMBULATORY_CARE_PROVIDER_SITE_OTHER): Payer: Managed Care, Other (non HMO) | Admitting: Nurse Practitioner

## 2014-07-08 ENCOUNTER — Encounter: Payer: Self-pay | Admitting: Nurse Practitioner

## 2014-07-08 VITALS — BP 112/60 | HR 61 | Temp 97.9°F | Ht 64.0 in | Wt 123.0 lb

## 2014-07-08 DIAGNOSIS — M5489 Other dorsalgia: Secondary | ICD-10-CM

## 2014-07-08 DIAGNOSIS — M545 Low back pain, unspecified: Secondary | ICD-10-CM

## 2014-07-08 DIAGNOSIS — Z1211 Encounter for screening for malignant neoplasm of colon: Secondary | ICD-10-CM

## 2014-07-08 DIAGNOSIS — G4700 Insomnia, unspecified: Secondary | ICD-10-CM

## 2014-07-08 NOTE — Patient Instructions (Signed)
CONtinue using heat, tylenol, and good body mechanics for back pain.  Let me know if you would like to see physical therapist.  Please let me know if you develop symptoms of tingling or numbness in legs or feet, bowel or bladder incontinence.  Return or mail stool cards. My office will call with lab results.  Get bone density scan this year if you are interested in taking medications.  I will need to see you every 6 mos to continue prescribing restoril, I will monitor thyroid yearly, otherwise see you as you need.  Nice to see you!

## 2014-07-08 NOTE — Progress Notes (Signed)
Pre visit review using our clinic review tool, if applicable. No additional management support is needed unless otherwise documented below in the visit note. 

## 2014-07-09 ENCOUNTER — Telehealth: Payer: Self-pay | Admitting: Nurse Practitioner

## 2014-07-09 MED ORDER — TEMAZEPAM 15 MG PO CAPS: 15.0000 mg | ORAL_CAPSULE | Freq: Every evening | ORAL | Status: DC | PRN

## 2014-07-09 NOTE — Progress Notes (Signed)
Subjective:    Lynn Hayden is a 74 y.o. female who presents for follow up of low back problems. She was seen in ofc last week after having pain in low back subsequent to helping husband lift tread mill-she heard "pop" & experienced pain. Xray showed T12 fracture-not sure if old or new, She has Hx OP. Pain has improved with conservative measures: tylenol, hydrocodone, heat, good body mechanics. Pain is worse w/lying down & rolling over in bed. She does not wish to have MRI or see orthopedist/back specialist.  She requests refill temazepam. Understands not to take w/hydrocodone. Wants to have ifob rather than colonoscopy for colon ca screening. The following portions of the patient's history were reviewed and updated as appropriate: allergies, current medications, past medical history, past social history, past surgical history and problem list.    Objective:    BP 112/60 mmHg  Pulse 61  Temp(Src) 97.9 F (36.6 C) (Oral)  Ht 5\' 4"  (1.626 m)  Wt 123 lb (55.792 kg)  BMI 21.10 kg/m2  SpO2 97% General appearance: alert, cooperative, appears stated age and no distress Head: Normocephalic, without obvious abnormality, atraumatic Eyes: negative findings: lids and lashes normal and conjunctivae and sclerae normal Back: no tenderness to percussion or palpation, range of motion normal    Assessment:Plan   1. Colon cancer screening - Fecal occult blood, imunochemical; Future  2. Lumbosacral pain Improved w/conservative measures If increase in pain, new symptoms, or no resolve she will call Continue tylenol hydrocodone heat PRN Good body mechanics demonstrated  3. Insomnia - temazepam (RESTORIL) 15 MG capsule; Take 1 capsule (15 mg total) by mouth at bedtime as needed.  Dispense: 30 capsule; Refill: 3

## 2014-07-09 NOTE — Assessment & Plan Note (Signed)
Pain has improved w/time Continue conservative measures, does not wish to see orthopedist or have MRI of spine.

## 2014-07-09 NOTE — Telephone Encounter (Signed)
Please clarify that she is to take 15 mg of temazepam at night, NOT 30 mg. Looks like prior prescription was 30 mg. That is too much.

## 2014-07-10 NOTE — Telephone Encounter (Signed)
Patient notified

## 2014-07-22 ENCOUNTER — Other Ambulatory Visit (INDEPENDENT_AMBULATORY_CARE_PROVIDER_SITE_OTHER): Payer: Managed Care, Other (non HMO)

## 2014-07-22 ENCOUNTER — Encounter: Payer: Self-pay | Admitting: *Deleted

## 2014-07-22 ENCOUNTER — Telehealth: Payer: Self-pay | Admitting: Nurse Practitioner

## 2014-07-22 DIAGNOSIS — Z1211 Encounter for screening for malignant neoplasm of colon: Secondary | ICD-10-CM

## 2014-07-22 LAB — FECAL OCCULT BLOOD, IMMUNOCHEMICAL: Fecal Occult Bld: NEGATIVE

## 2014-07-22 NOTE — Telephone Encounter (Signed)
pls call pt: Advise Stool test is normal. Repeat in 1 year.

## 2014-07-22 NOTE — Telephone Encounter (Signed)
Left detailed message on pt's cell vm. Per pt's DPR. 

## 2014-08-17 ENCOUNTER — Encounter: Payer: Self-pay | Admitting: *Deleted

## 2014-12-14 ENCOUNTER — Ambulatory Visit (INDEPENDENT_AMBULATORY_CARE_PROVIDER_SITE_OTHER): Payer: Managed Care, Other (non HMO) | Admitting: Nurse Practitioner

## 2014-12-14 ENCOUNTER — Encounter: Payer: Self-pay | Admitting: Nurse Practitioner

## 2014-12-14 ENCOUNTER — Other Ambulatory Visit: Payer: Self-pay | Admitting: Nurse Practitioner

## 2014-12-14 ENCOUNTER — Telehealth: Payer: Self-pay | Admitting: *Deleted

## 2014-12-14 ENCOUNTER — Other Ambulatory Visit: Payer: Self-pay | Admitting: *Deleted

## 2014-12-14 VITALS — BP 128/78 | HR 80 | Temp 98.1°F | Resp 18 | Ht 65.0 in | Wt 124.0 lb

## 2014-12-14 DIAGNOSIS — R197 Diarrhea, unspecified: Secondary | ICD-10-CM

## 2014-12-14 DIAGNOSIS — E049 Nontoxic goiter, unspecified: Secondary | ICD-10-CM

## 2014-12-14 DIAGNOSIS — G4700 Insomnia, unspecified: Secondary | ICD-10-CM

## 2014-12-14 DIAGNOSIS — K529 Noninfective gastroenteritis and colitis, unspecified: Secondary | ICD-10-CM | POA: Diagnosis not present

## 2014-12-14 DIAGNOSIS — E010 Iodine-deficiency related diffuse (endemic) goiter: Secondary | ICD-10-CM

## 2014-12-14 DIAGNOSIS — E559 Vitamin D deficiency, unspecified: Secondary | ICD-10-CM | POA: Diagnosis not present

## 2014-12-14 MED ORDER — DIPHENOXYLATE-ATROPINE 2.5-0.025 MG PO TABS: 2.5000 | ORAL_TABLET | ORAL | Status: DC | PRN

## 2014-12-14 MED ORDER — DIPHENOXYLATE-ATROPINE 2.5-0.025 MG PO TABS: 1.0000 | ORAL_TABLET | ORAL | Status: DC | PRN

## 2014-12-14 MED ORDER — TEMAZEPAM 30 MG PO CAPS: 30.0000 mg | ORAL_CAPSULE | Freq: Every evening | ORAL | Status: DC | PRN

## 2014-12-14 NOTE — Telephone Encounter (Signed)
Patient called and said that her 2 scripts were [facility] [place], [facility] [address] [facility] [place]   I had put the new pharmacy in per her request this AM.  I didn't remove the old one and it defaulted back to that one.  [facility] and called patient to advise of such.

## 2014-12-14 NOTE — Patient Instructions (Signed)
Decrease calcium dose to 600 mg daily and eat a handful of green leafy foods daily to get enough calcium.  Please return for labs at your convenience.  My office will call with  Results.  Please return for pneumonia vaccine.   It has been a pleasure to care for you!

## 2014-12-14 NOTE — Progress Notes (Signed)
Subjective:     Lynn Hayden is a 74 y.o. female presents for follow up of insomnia and chronic diarrhea, thyromegaly. She is concerned about "pooch" over abdomen. I reviewed health maintenance: she refuses mammograms, zoster vaccine, and colonoscopy. Insomnia: she has been taking temazepam 30 mg fotr 30 yrs. It is hard for her to fall asleep a7 to stay asleep. She reports her father never slept much. She ran out temazepam and started using 4 T advil pm or excedrin pm or tylenol pm. She says these don't work well. She denies gastric upset. I counseled her on potential SE of NSAIDS & taking over 1000 mg of tylenol in 1 dose. She feels well, although she doesn't sleep more than a few hours/night. She denies falling asleep during activities, trouble concentrating. I encouraged her to wean off meds, discussed sleep hygiene. Pt c/o the aggravation of lying in bed "for hours" without sleep".  I discussed dangers of falling when using meds such as temazepam & benadryl. Diarrhea: occurs 1-2 times week if she eats at restaurant or has starbucks frappaccino. I suggested she may be lactose intolerant. She says she has been tested & was not. She enjoys eating ice cream every night. She controls diarrhea with lomotil 1-2 times week prn. Thyromegaly: last neck US 2015. Last Thyroid panel nml. Denies symptoms of hyper or hypothyroidism. Abdomen: she has noticed swollen area above navel in last few mos. Gets flat when she lied down . Denies pain.   She agrees to return this fall for Pna & flu vaccine-doesn't want to get pna now as she enjoys working in garden daily & doesn't want "sore arm".  The following portions of the patient's history were reviewed and updated as appropriate: allergies, current medications, past family history, past medical history, past social history, past surgical history and problem list.  Review of Systems Pertinent items are noted in HPI.    Objective:    BP 128/78 mmHg  Pulse 80   Temp(Src) 98.1 F (36.7 C) (Oral)  Resp 18  Ht 5\' 5"  (1.651 m)  Wt 124 lb (56.246 kg)  BMI 20.63 kg/m2  SpO2 98% General appearance: alert, cooperative, appears stated age and no distress Eyes: negative findings: lids and lashes normal and conjunctivae and sclerae normal Lungs: clear to auscultation bilaterally Heart: regular rate and rhythm, S1, S2 normal, no murmur, click, rub or gallop Abdomen: soft, non-tender; bowel sounds normal; no masses,  no organomegaly Neurologic: Grossly normal    Assessment:plan     1. Insomnia Encourage no meds, sleep hygiene. Stop OTC "pm" meds. - temazepam (RESTORIL) 30 MG capsule; Take 1 capsule (30 mg total) by mouth at bedtime as needed.  Dispense: 30 capsule; Refill: 5  2.. Vitamin D deficiency - Vit D  25 hydroxy (rtn osteoporosis monitoring); Future  3. Chronic diarrhea Cont lomotil PRN, minimize cow's milk  - Comprehensive metabolic panel; Future - Vit D  25 hydroxy (rtn osteoporosis monitoring); Future - Vitamin B12; Future - diphenoxylate-atropine (LOMOTIL) 2.5-0.025 MG per tablet; Take 2.5 tablets by mouth as needed.  Dispense: 30 tablet; Refill: 4 4 Thyromegaly Cont to monitor-last neck US 2015. - TSH; Future  F/u 1 yr or PRN lab results.

## 2014-12-21 ENCOUNTER — Other Ambulatory Visit (INDEPENDENT_AMBULATORY_CARE_PROVIDER_SITE_OTHER): Payer: Medicare HMO

## 2014-12-21 DIAGNOSIS — E049 Nontoxic goiter, unspecified: Secondary | ICD-10-CM | POA: Diagnosis not present

## 2014-12-21 DIAGNOSIS — K529 Noninfective gastroenteritis and colitis, unspecified: Secondary | ICD-10-CM | POA: Diagnosis not present

## 2014-12-21 DIAGNOSIS — E559 Vitamin D deficiency, unspecified: Secondary | ICD-10-CM

## 2014-12-21 DIAGNOSIS — R197 Diarrhea, unspecified: Secondary | ICD-10-CM

## 2014-12-21 DIAGNOSIS — E010 Iodine-deficiency related diffuse (endemic) goiter: Secondary | ICD-10-CM

## 2014-12-21 LAB — COMPREHENSIVE METABOLIC PANEL
ALBUMIN: 3.7 g/dL (ref 3.5–5.2)
ALK PHOS: 43 U/L (ref 39–117)
ALT: 10 U/L (ref 0–35)
AST: 14 U/L (ref 0–37)
BILIRUBIN TOTAL: 0.6 mg/dL (ref 0.2–1.2)
BUN: 20 mg/dL (ref 6–23)
CHLORIDE: 106 meq/L (ref 96–112)
CO2: 29 mEq/L (ref 19–32)
CREATININE: 0.75 mg/dL (ref 0.40–1.20)
Calcium: 9.1 mg/dL (ref 8.4–10.5)
GFR: 80.24 mL/min (ref >60.00)
Glucose, Bld: 100 mg/dL — ABNORMAL HIGH (ref 70–99)
Potassium: 4.4 mEq/L (ref 3.5–5.1)
Sodium: 142 mEq/L (ref 135–145)
Total Protein: 6.5 g/dL (ref 6.0–8.3)

## 2014-12-21 LAB — VITAMIN D 25 HYDROXY (VIT D DEFICIENCY, FRACTURES): VITD: 78.11 ng/mL (ref 30.00–100.00)

## 2014-12-21 LAB — VITAMIN B12: Vitamin B-12: 226 pg/mL (ref 211–911)

## 2014-12-21 LAB — TSH: TSH: 1.84 u[IU]/mL (ref 0.35–4.50)

## 2014-12-22 ENCOUNTER — Telehealth: Payer: Self-pay | Admitting: Nurse Practitioner

## 2014-12-22 NOTE — Telephone Encounter (Signed)
Patient aware.

## 2014-12-22 NOTE — Telephone Encounter (Signed)
pls call pt: Advise Labs look good-decrease vitamin D to 5 days/week. B12 is low. Start b complex. Take daily to get or 1 mg B12 daily. Have level checked again in 3 mos.

## 2015-01-06 ENCOUNTER — Ambulatory Visit (INDEPENDENT_AMBULATORY_CARE_PROVIDER_SITE_OTHER): Payer: Medicare HMO | Admitting: Family Medicine

## 2015-01-06 ENCOUNTER — Encounter: Payer: Self-pay | Admitting: Family Medicine

## 2015-01-06 VITALS — BP 130/82 | HR 80 | Temp 98.0°F | Ht 65.0 in | Wt 125.0 lb

## 2015-01-06 DIAGNOSIS — N390 Urinary tract infection, site not specified: Secondary | ICD-10-CM | POA: Diagnosis not present

## 2015-01-06 DIAGNOSIS — R319 Hematuria, unspecified: Secondary | ICD-10-CM

## 2015-01-06 DIAGNOSIS — R300 Dysuria: Secondary | ICD-10-CM | POA: Diagnosis not present

## 2015-01-06 LAB — POCT URINALYSIS DIPSTICK
Bilirubin, UA: NEGATIVE
Glucose, UA: NEGATIVE
Ketones, UA: NEGATIVE
Nitrite, UA: NEGATIVE
Protein, UA: NEGATIVE
SPEC GRAV UA: 1.02
Urobilinogen, UA: 0.2
pH, UA: 6.5

## 2015-01-06 MED ORDER — SULFAMETHOXAZOLE-TRIMETHOPRIM 800-160 MG PO TABS: 1.0000 | ORAL_TABLET | Freq: Two times a day (BID) | ORAL | Status: DC

## 2015-01-06 NOTE — Assessment & Plan Note (Signed)
Lynn Hayden is a 74 y.o.  Female presents to OV with complaints of dysuria. - UA today: Trace blood, negative nitrate, small leukocytes. Urine culture sent, discussed with patient she will be called once the urine culture results are available only if we have to change her antibiotics. - Considering patient is allergic to amoxicillin with a possible anaphylactic reaction, will avoid Keflex today. Macrobid was considered on the BEERS list. Ciprofloxacin considered, but given prior history with needing to change from Cipro will use Bactrim today. Patient denies any reactions to sulfa in the past. - Follow-up when necessary

## 2015-01-06 NOTE — Progress Notes (Signed)
   Subjective:    Patient ID: Lynn Hayden, female    DOB: April 11, 1941, 74 y.o.   MRN: [id number]  HPI  Dysuria: Patients states 7 days ago she noticed frequency in urination and mild burning with urination. Patient denies any fever, back pain, hematuria or abdominal pain. She did admit to mild cramp-like pain suprapubic. She denies vaginal discharge or irritation. Patient is eating and drinking well. He denies nausea or vomiting, but states that she does have irritable bowel disease and has frequent diarrhea. She does have to wear a pad daily for her diarrhea. Patient had started taken over-the-counter Azo, which she felt initially helped, but as soon as she stopped using her frequency and pain returned. She denies history of kidney stones. She states that she's had multiple urinary tract infections in the past, and used to be given Cipro for these infections, and was told that her urine cultures are not sensitive to Cipro and switch to another antibiotics which she cannot recall. Patient is allergic to amoxicillin, she states she has shortness of breath and rash. Only urine culture in the system was from 2010 at a [facility], at that time the results were pansensitive Citrobacter Koseri, found under care everywhere TAB in the EMR.  Former Smoker Past Medical History  Diagnosis Date  . Insomnia   . Arthritis   . Urinary tract bacterial infections   . Chronic diarrhea   . Osteoporosis   . Thyromegaly   . Hemorrhoid   . Miscarriage    Allergies  Allergen Reactions  . Amoxicillin Rash   Past Surgical History  Procedure Laterality Date  . Tonsillectomy    . Dilation and curettage of uterus    . Wisdom tooth extraction    . Ganglion cyst excision      rt wrist  . Ear cyst excision  11/09/2011    Procedure: CYST REMOVAL;  Surgeon: Tami Ribas, MD;  Location: [facility];  Service: Orthopedics;  Laterality: Right;  right index excision cyst/foreign body and  debridement DIP joint  . Ganglion cyst excision      Review of Systems Negative, with the exception of above mentioned in HPI     Objective:   Physical Exam BP 130/82 mmHg  Pulse 80  Temp(Src) 98 F (36.7 C) (Oral)  Ht  (1.651 m)  Wt 125 lb (56.7 kg)  BMI 20.80 kg/m2  SpO2 99% Gen: NAD. Nontoxic appearance, well-developed, well-nourished Caucasian female. Very pleasant. Abd: Soft. Flat. NTND. BS present. No Masses palpated.  MSK: No CVA tenderness bilaterally     Assessment & Plan:  CONSUELLA SCURLOCK is a 74 y.o.  Female presents to OV with complaints of dysuria. - UA today: Trace blood, negative nitrate, small leukocytes. Urine culture sent, discussed with patient she will be called once the urine culture results are available only if we have to change her antibiotics. - Considering patient is allergic to amoxicillin with a possible anaphylactic reaction, will avoid Keflex today. Macrobid was considered on the BEERS list. Ciprofloxacin considered, but given prior history with needing to change from Cipro will use Bactrim today. Patient denies any reactions to sulfa in the past. - Follow-up when necessary

## 2015-01-06 NOTE — Patient Instructions (Signed)
Urinary Tract Infection Urinary tract infections (UTIs) can develop anywhere along your urinary tract. Your urinary tract is your body's drainage system for removing wastes and extra water. Your urinary tract includes two kidneys, two ureters, a bladder, and a urethra. Your kidneys are a pair of bean-shaped organs. Each kidney is about the size of your fist. They are located below your ribs, one on each side of your spine. CAUSES Infections are caused by microbes, which are microscopic organisms, including fungi, viruses, and bacteria. These organisms are so small that they can only be seen through a microscope. Bacteria are the microbes that most commonly cause UTIs. SYMPTOMS  Symptoms of UTIs may vary by age and gender of the patient and by the location of the infection. Symptoms in young women typically include a frequent and intense urge to urinate and a painful, burning feeling in the bladder or urethra during urination. Older women and men are more likely to be tired, shaky, and weak and have muscle aches and abdominal pain. A fever may mean the infection is in your kidneys. Other symptoms of a kidney infection include pain in your back or sides below the ribs, nausea, and vomiting. DIAGNOSIS To diagnose a UTI, your caregiver will ask you about your symptoms. Your caregiver also will ask to provide a urine sample. The urine sample will be tested for bacteria and white blood cells. White blood cells are made by your body to help fight infection. TREATMENT  Typically, UTIs can be treated with medication. Because most UTIs are caused by a bacterial infection, they usually can be treated with the use of antibiotics. The choice of antibiotic and length of treatment depend on your symptoms and the type of bacteria causing your infection. HOME CARE INSTRUCTIONS  If you were prescribed antibiotics, take them exactly as your caregiver instructs you. Finish the medication even if you feel better after you  have only taken some of the medication.  Drink enough water and fluids to keep your urine clear or pale yellow.  Avoid caffeine, tea, and carbonated beverages. They tend to irritate your bladder.  Empty your bladder often. Avoid holding urine for long periods of time.  Empty your bladder before and after sexual intercourse.  After a bowel movement, women should cleanse from front to back. Use each tissue only once. SEEK MEDICAL CARE IF:   You have back pain.  You develop a fever.  Your symptoms do not begin to resolve within 3 days. SEEK IMMEDIATE MEDICAL CARE IF:   You have severe back pain or lower abdominal pain.  You develop chills.  You have nausea or vomiting.  You have continued burning or discomfort with urination. MAKE SURE YOU:   Understand these instructions.  Will watch your condition.  Will get help right away if you are not doing well or get worse. Document Released: 03/01/2005 Document Revised: 11/21/2011 Document Reviewed: 06/30/2011 [facility] Patient Information 2015 [place], [place]. This information is not intended to replace advice given to you by your health care provider. Make sure you discuss any questions you have with your health care provider.  It was a pleasure meeting you today. I have prescribed a medication called Bactrim for your UTI. You will take 1 pill every 12 hours for 5 days. We will call you once the results of your culture comes back only if we need to change your antibiotic.

## 2015-01-09 LAB — URINE CULTURE

## 2015-03-12 ENCOUNTER — Ambulatory Visit (INDEPENDENT_AMBULATORY_CARE_PROVIDER_SITE_OTHER): Payer: Medicare HMO | Admitting: Family Medicine

## 2015-03-12 ENCOUNTER — Encounter: Payer: Self-pay | Admitting: Family Medicine

## 2015-03-12 VITALS — BP 164/83 | HR 55 | Temp 97.4°F | Resp 16 | Ht 65.0 in | Wt 126.0 lb

## 2015-03-12 DIAGNOSIS — R350 Frequency of micturition: Secondary | ICD-10-CM

## 2015-03-12 DIAGNOSIS — Z23 Encounter for immunization: Secondary | ICD-10-CM | POA: Diagnosis not present

## 2015-03-12 DIAGNOSIS — N3090 Cystitis, unspecified without hematuria: Secondary | ICD-10-CM

## 2015-03-12 LAB — POCT URINALYSIS DIPSTICK
Bilirubin, UA: NEGATIVE
Glucose, UA: NEGATIVE
KETONES UA: NEGATIVE
Nitrite, UA: NEGATIVE
PROTEIN UA: NEGATIVE
Spec Grav, UA: 1.025
UROBILINOGEN UA: 0.2
pH, UA: 6.5

## 2015-03-12 MED ORDER — SULFAMETHOXAZOLE-TRIMETHOPRIM 800-160 MG PO TABS: 1.0000 | ORAL_TABLET | Freq: Two times a day (BID) | ORAL | Status: DC

## 2015-03-12 NOTE — Progress Notes (Signed)
Pre visit review using our clinic review tool, if applicable. No additional management support is needed unless otherwise documented below in the visit note. 

## 2015-03-12 NOTE — Progress Notes (Signed)
OFFICE NOTE  03/12/2015  CC:  Chief Complaint  Patient presents with  . Urinary Tract Infection    frequency, retention   HPI: Patient is a 74 y.o. Caucasian female who is here for urinary frequency. Onset 2 nights ago, urinary frequency and urgency, had some dysuria yesterday.   No hematuria, fever, flank pain, abd pain, or nausea.  Took AZO a couple of times, most recent dose was yesterday evening.  Says urine is not orange this morning.    Pertinent PMH:  PMH and PSH reviewed  MEDS: Not taking bactrim listed below Outpatient Prescriptions Prior to Visit  Medication Sig Dispense Refill  . calcium carbonate (OS-CAL) 600 MG TABS tablet Take 600 mg by mouth daily.    . cholecalciferol (VITAMIN D) 1000 UNITS tablet Take 1,000 Units by mouth daily. Takes 2    . diphenoxylate-atropine (LOMOTIL) 2.5-0.025 MG per tablet Take 1 tablet by mouth as needed for diarrhea or loose stools. 30 tablet 4  . temazepam (RESTORIL) 30 MG capsule Take 1 capsule (30 mg total) by mouth at bedtime as needed. 30 capsule 5  . vitamin B-12 (CYANOCOBALAMIN) 1000 MCG tablet Take 1,000 mcg by mouth daily.    Marland Kitchen sulfamethoxazole-trimethoprim (BACTRIM DS,SEPTRA DS) 800-160 MG per tablet Take 1 tablet by mouth 2 (two) times daily. (Patient not taking: Reported on 03/12/2015) 10 tablet 0   No facility-administered medications prior to visit.    PE: Blood pressure 164/83, pulse 55, temperature 97.4 F (36.3 C), temperature source Oral, resp. rate 16, height  (1.651 m), weight 126 lb (57.153 kg), SpO2 99 %. Gen: Alert, well appearing.  Patient is oriented to person, place, time, and situation. AFFECT: pleasant, lucid thought and speech. No further exam today.  LAB: CC UA with trace intact blood and trace LEU, otherwise normal.  Yellow color.  IMPRESSION AND PLAN:  Lower UTI. Send urine for c/s. Bactrim DS, 1 bid x 5d rx'd--she may stop after 3d if all sx's resolved at that time. Signs/symptoms to call or  return for were reviewed and pt expressed understanding.  High dose flu vaccine given today.  An After Visit Summary was printed and given to the patient.  FOLLOW UP: prn

## 2015-03-12 NOTE — Addendum Note (Signed)
Addended by: Westley Hummer on: 03/12/2015 12:06 PM   Modules accepted: Orders

## 2015-03-15 LAB — URINE CULTURE

## 2015-06-22 DIAGNOSIS — H2513 Age-related nuclear cataract, bilateral: Secondary | ICD-10-CM | POA: Diagnosis not present

## 2015-06-22 DIAGNOSIS — H02105 Unspecified ectropion of left lower eyelid: Secondary | ICD-10-CM | POA: Diagnosis not present

## 2015-06-22 DIAGNOSIS — H02102 Unspecified ectropion of right lower eyelid: Secondary | ICD-10-CM | POA: Diagnosis not present

## 2015-07-02 DIAGNOSIS — Z0100 Encounter for examination of eyes and vision without abnormal findings: Secondary | ICD-10-CM | POA: Diagnosis not present

## 2015-07-02 DIAGNOSIS — Z87898 Personal history of other specified conditions: Secondary | ICD-10-CM | POA: Diagnosis not present

## 2015-07-02 DIAGNOSIS — Z23 Encounter for immunization: Secondary | ICD-10-CM | POA: Diagnosis not present

## 2015-07-02 DIAGNOSIS — R197 Diarrhea, unspecified: Secondary | ICD-10-CM | POA: Diagnosis not present

## 2015-07-02 DIAGNOSIS — M810 Age-related osteoporosis without current pathological fracture: Secondary | ICD-10-CM | POA: Diagnosis not present

## 2015-07-02 DIAGNOSIS — G4700 Insomnia, unspecified: Secondary | ICD-10-CM | POA: Diagnosis not present

## 2015-07-28 DIAGNOSIS — M810 Age-related osteoporosis without current pathological fracture: Secondary | ICD-10-CM | POA: Diagnosis not present

## 2015-10-26 DIAGNOSIS — R69 Illness, unspecified: Secondary | ICD-10-CM | POA: Diagnosis not present

## 2015-11-04 DIAGNOSIS — L237 Allergic contact dermatitis due to plants, except food: Secondary | ICD-10-CM | POA: Diagnosis not present

## 2015-11-08 DIAGNOSIS — L0390 Cellulitis, unspecified: Secondary | ICD-10-CM | POA: Diagnosis not present

## 2015-11-08 DIAGNOSIS — L237 Allergic contact dermatitis due to plants, except food: Secondary | ICD-10-CM | POA: Diagnosis not present

## 2016-01-21 DIAGNOSIS — H6123 Impacted cerumen, bilateral: Secondary | ICD-10-CM | POA: Diagnosis not present

## 2016-03-02 DIAGNOSIS — Z23 Encounter for immunization: Secondary | ICD-10-CM | POA: Diagnosis not present

## 2016-05-09 DIAGNOSIS — R21 Rash and other nonspecific skin eruption: Secondary | ICD-10-CM | POA: Diagnosis not present

## 2016-05-09 DIAGNOSIS — N390 Urinary tract infection, site not specified: Secondary | ICD-10-CM | POA: Diagnosis not present

## 2016-05-15 DIAGNOSIS — G4700 Insomnia, unspecified: Secondary | ICD-10-CM | POA: Diagnosis not present

## 2016-05-15 DIAGNOSIS — Z1211 Encounter for screening for malignant neoplasm of colon: Secondary | ICD-10-CM | POA: Diagnosis not present

## 2016-05-15 DIAGNOSIS — M810 Age-related osteoporosis without current pathological fracture: Secondary | ICD-10-CM | POA: Diagnosis not present

## 2016-05-15 DIAGNOSIS — Z0000 Encounter for general adult medical examination without abnormal findings: Secondary | ICD-10-CM | POA: Diagnosis not present

## 2016-05-15 DIAGNOSIS — R21 Rash and other nonspecific skin eruption: Secondary | ICD-10-CM | POA: Diagnosis not present

## 2016-05-15 DIAGNOSIS — Z136 Encounter for screening for cardiovascular disorders: Secondary | ICD-10-CM | POA: Diagnosis not present

## 2016-05-15 DIAGNOSIS — K589 Irritable bowel syndrome without diarrhea: Secondary | ICD-10-CM | POA: Diagnosis not present

## 2016-05-15 DIAGNOSIS — Z131 Encounter for screening for diabetes mellitus: Secondary | ICD-10-CM | POA: Diagnosis not present

## 2016-05-15 DIAGNOSIS — Z13220 Encounter for screening for lipoid disorders: Secondary | ICD-10-CM | POA: Diagnosis not present

## 2016-05-15 DIAGNOSIS — R350 Frequency of micturition: Secondary | ICD-10-CM | POA: Diagnosis not present

## 2016-06-24 IMAGING — US US SOFT TISSUE HEAD/NECK
1 series · 14 of 21 positions shown · non-contrast
Comparison: None.

CLINICAL DATA: 73-year-old female with a history of thyroid
enlargement

EXAM:
THYROID ULTRASOUND
TECHNIQUE: Ultrasound examination of the thyroid gland and adjacent soft
tissues was performed.

[Series 1: us soft tissue head/neck · 0.05mm/px · 14 of 21 slices shown]
[im 1/21]
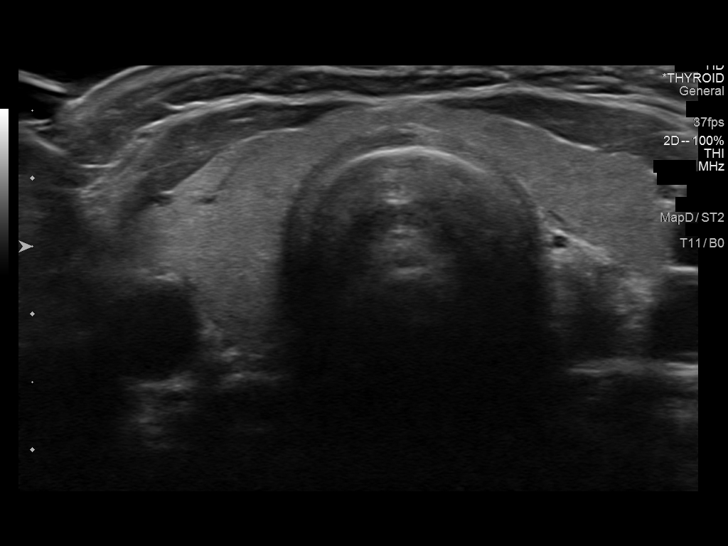
[im 3/21]
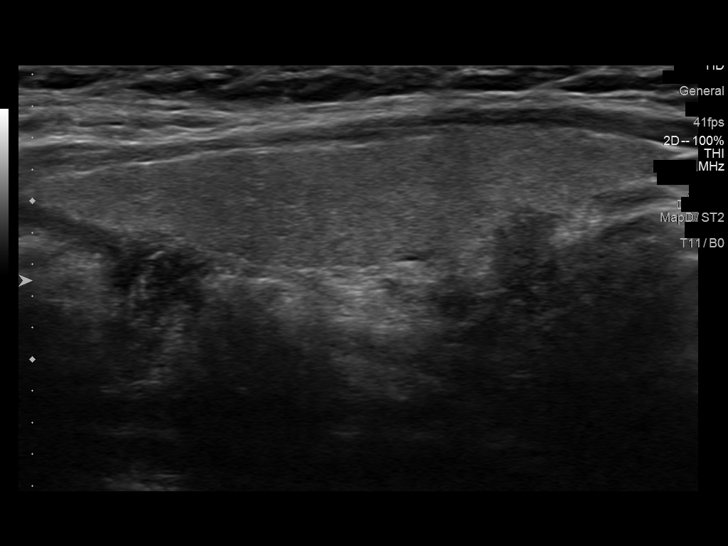
[im 4/21]
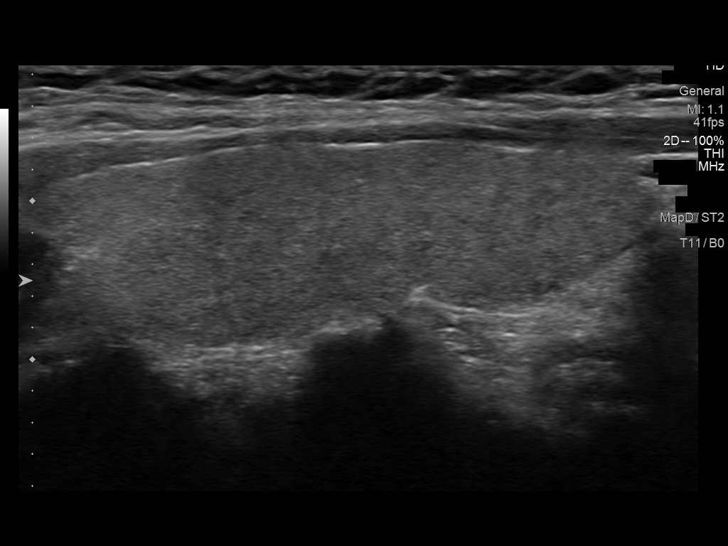
[im 6/21]
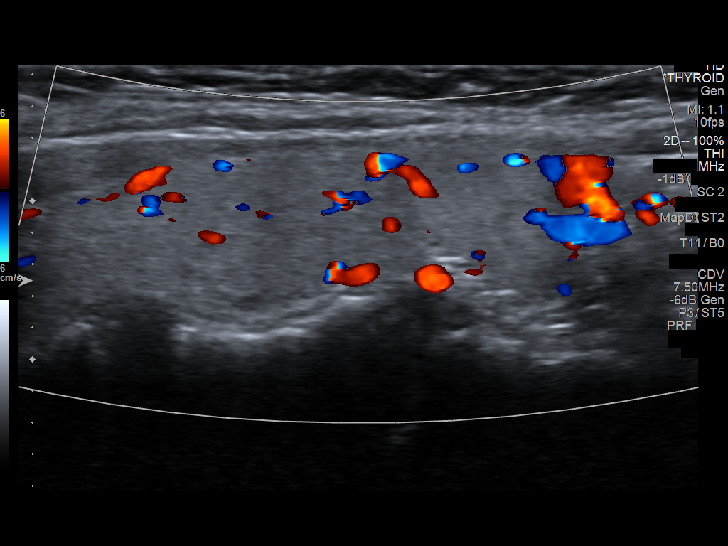
[im 7/21]
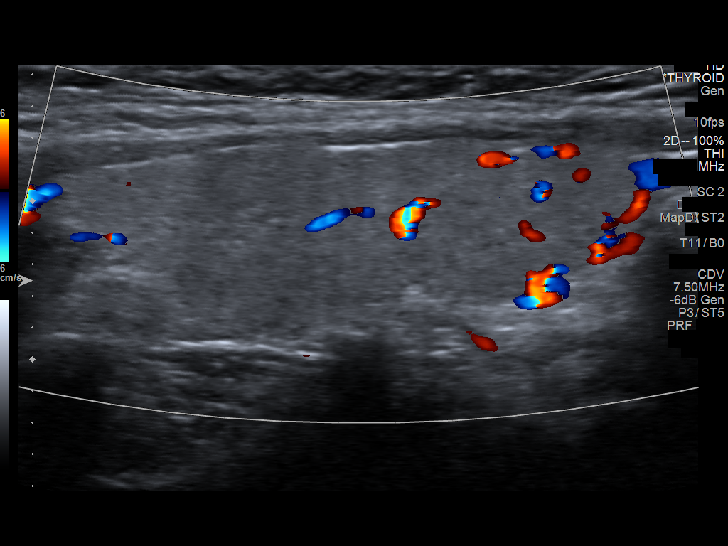
[im 9/21]
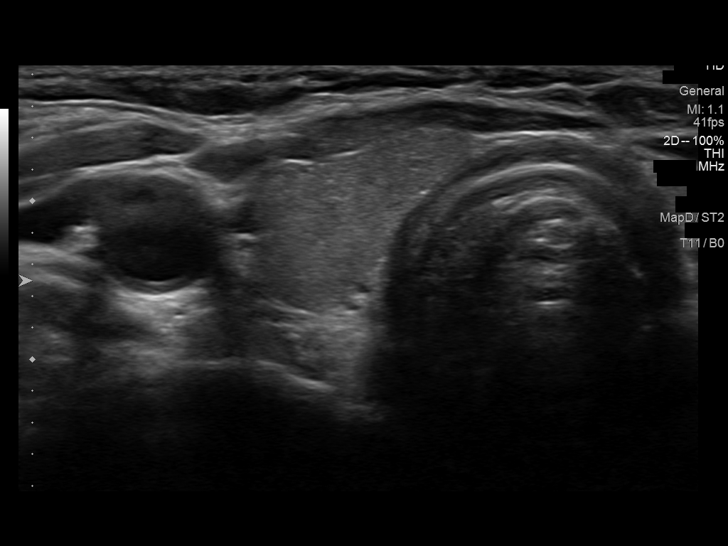
[im 10/21]
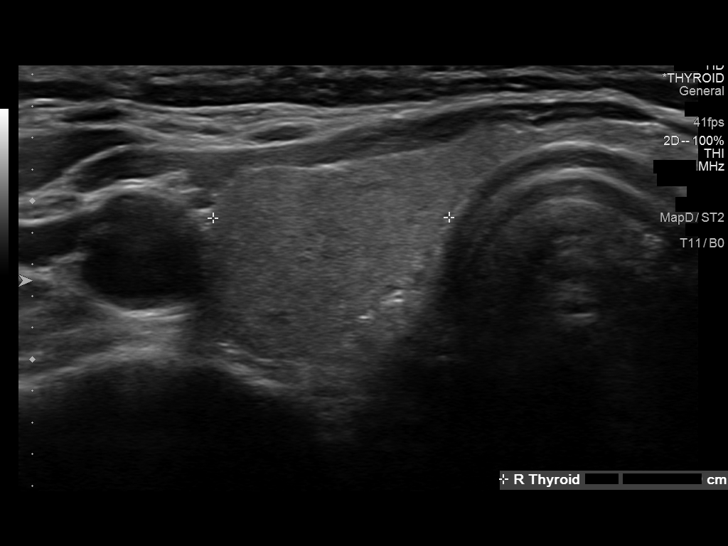
[im 12/21]
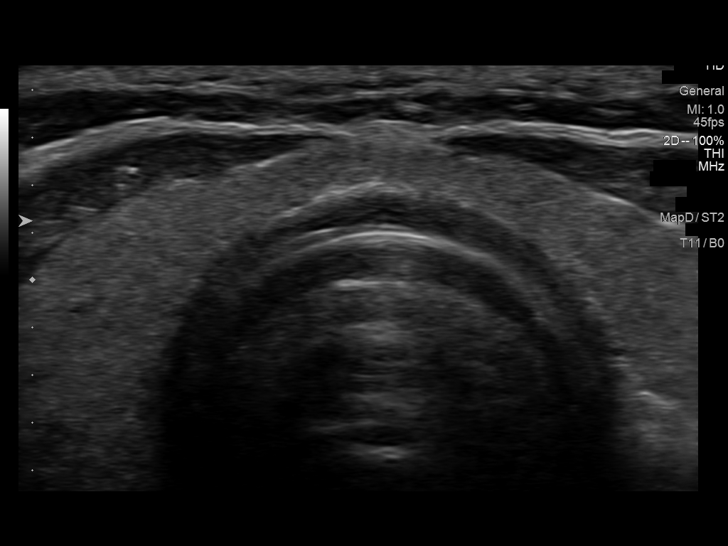
[im 13/21]
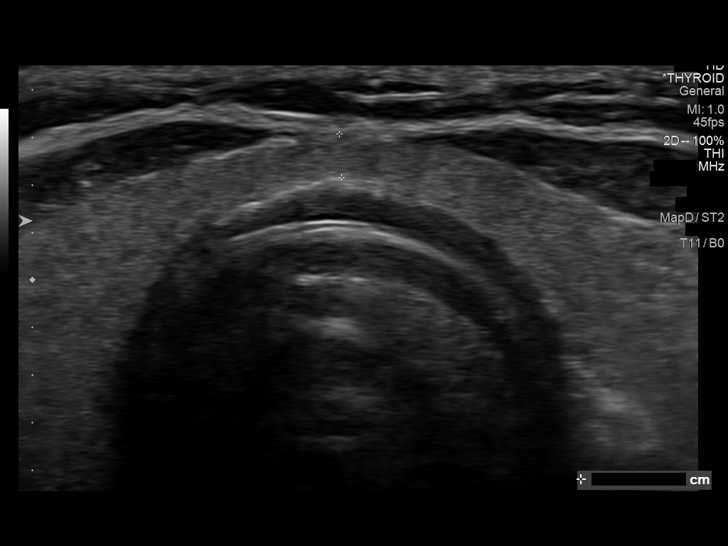
[im 15/21]
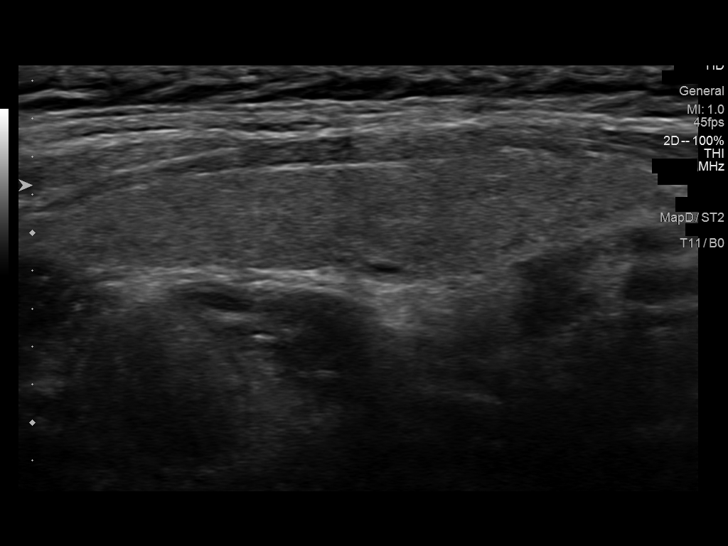
[im 16/21]
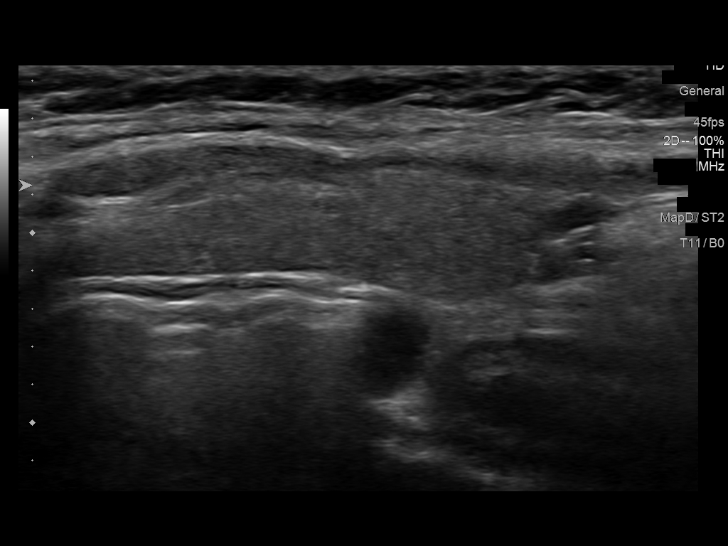
[im 18/21]
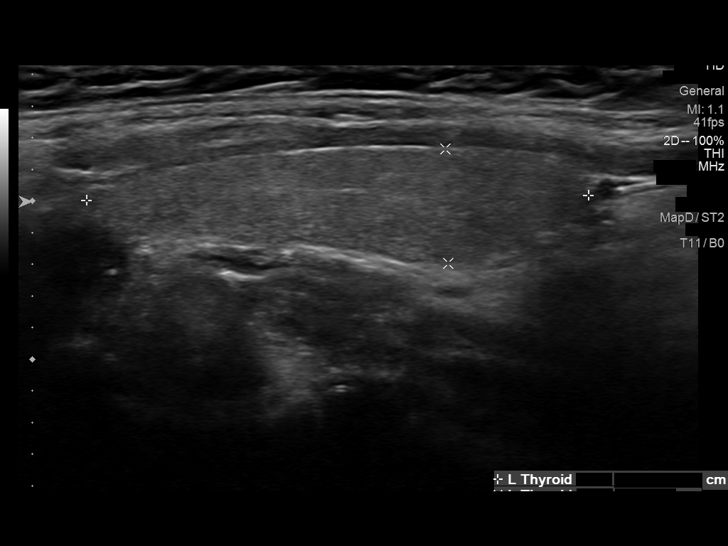
[im 19/21]
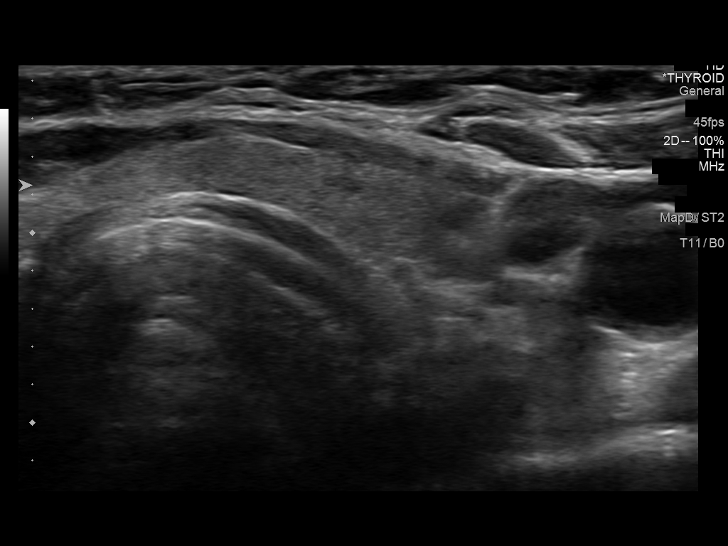
[im 21/21]
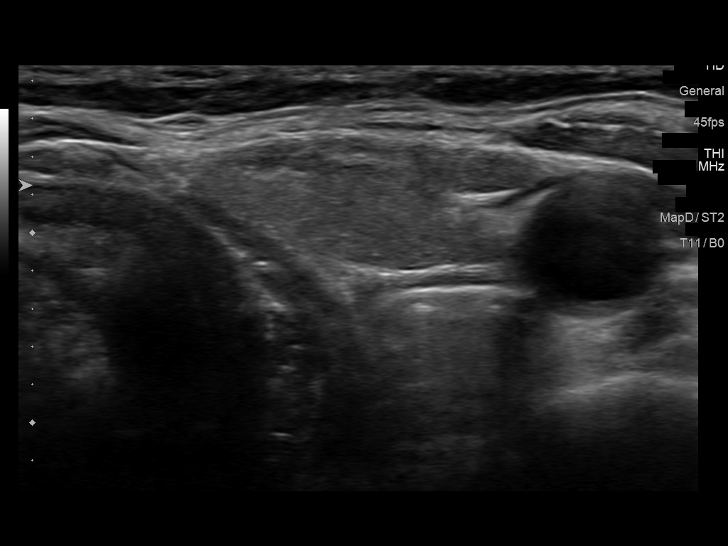

[14 of 21 positions shown; findings below may reference images not displayed]

FINDINGS: Right thyroid lobe

Measurements: 4.0 cm x 1.2 cm x 1.5 cm.  No nodules visualized.

Left thyroid lobe

Measurements: 3.2 cm x 0.7 cm x 1.4 cm.  No nodules visualized.

Isthmus

Thickness: 2 mm.  No nodules visualized.

Lymphadenopathy

None visualized.
IMPRESSION: Unremarkable sonographic survey of the thyroid.

## 2016-06-27 DIAGNOSIS — H2513 Age-related nuclear cataract, bilateral: Secondary | ICD-10-CM | POA: Diagnosis not present

## 2016-06-27 DIAGNOSIS — H5203 Hypermetropia, bilateral: Secondary | ICD-10-CM | POA: Diagnosis not present

## 2016-06-27 DIAGNOSIS — H04123 Dry eye syndrome of bilateral lacrimal glands: Secondary | ICD-10-CM | POA: Diagnosis not present

## 2016-07-25 DIAGNOSIS — H04123 Dry eye syndrome of bilateral lacrimal glands: Secondary | ICD-10-CM | POA: Diagnosis not present

## 2016-07-30 IMAGING — CR DG LUMBAR SPINE COMPLETE 4+V
5 series · 5 of 5 positions shown · non-contrast
Comparison: None.

CLINICAL DATA: Injured lifting last week with back pain

EXAM:
LUMBAR SPINE - COMPLETE 4+ VIEW

[view not recorded (1 of 5)]
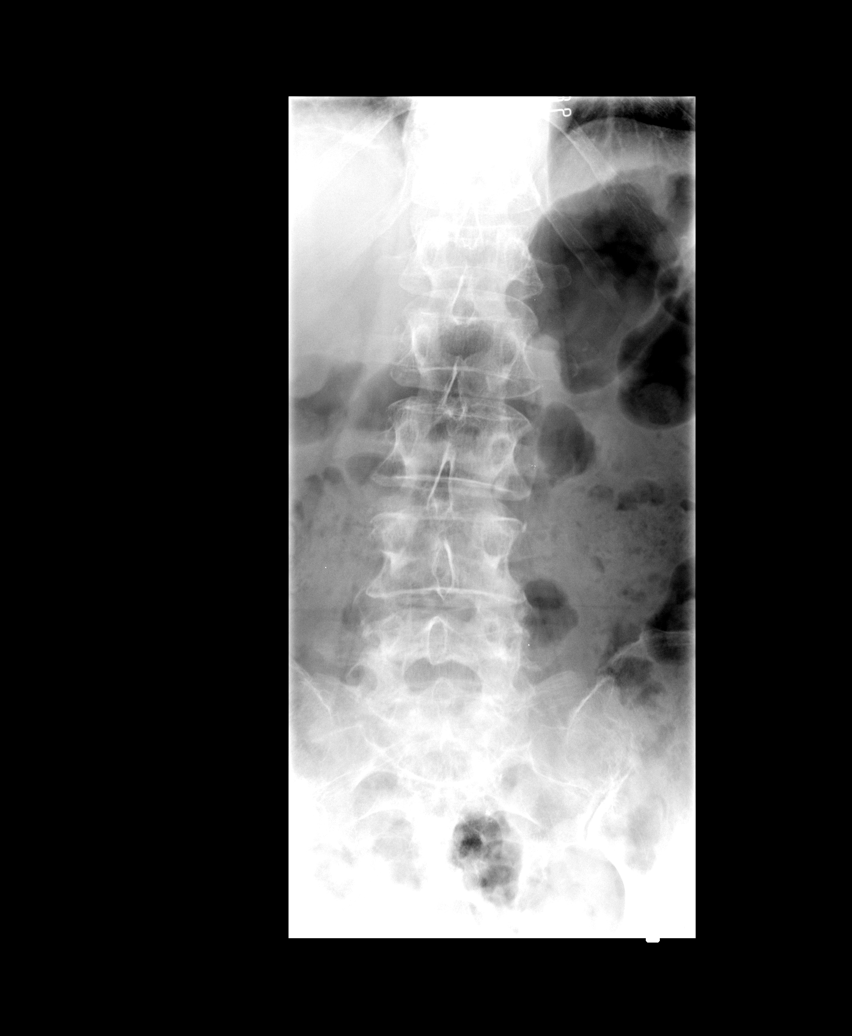

[view not recorded (2 of 5)]
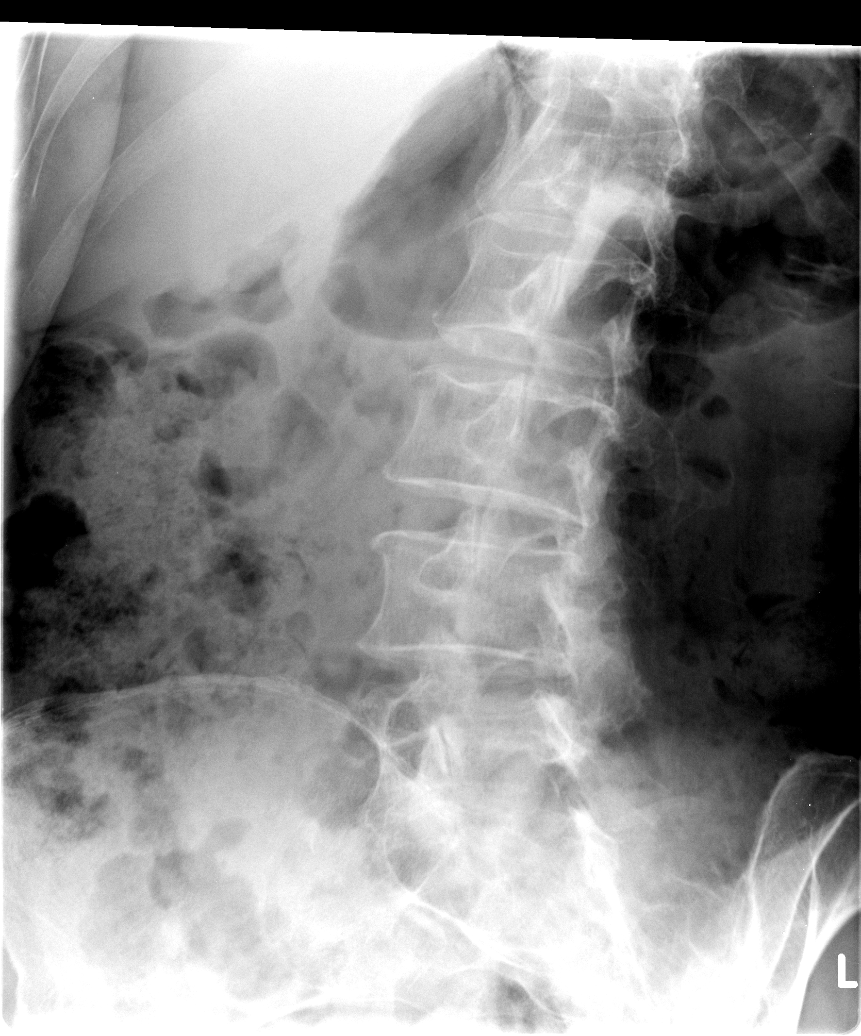

[view not recorded (3 of 5)]
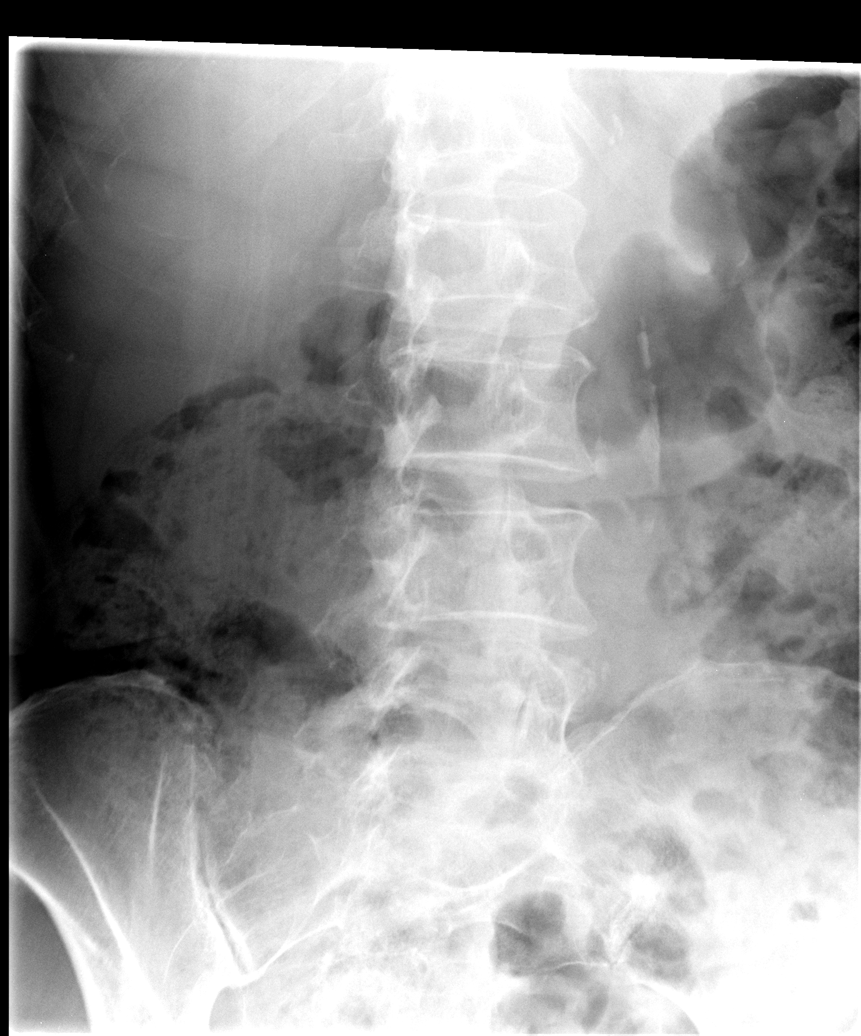

[view not recorded (4 of 5)]
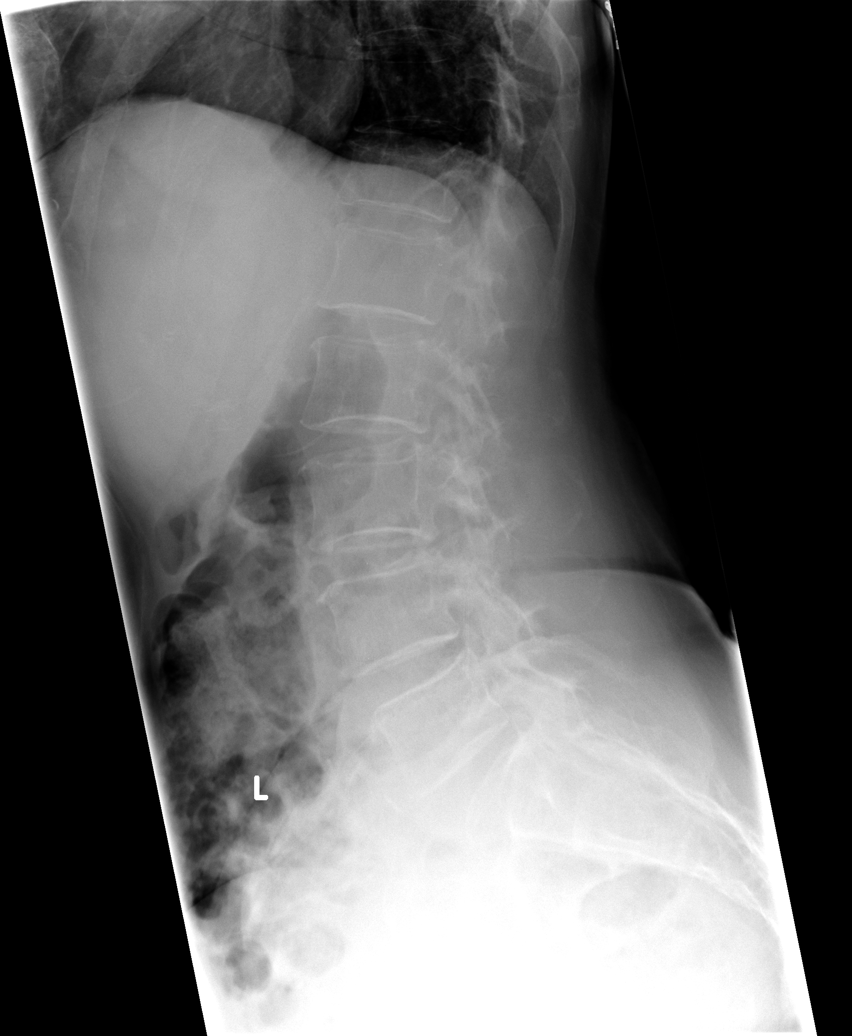

[view not recorded (5 of 5)]
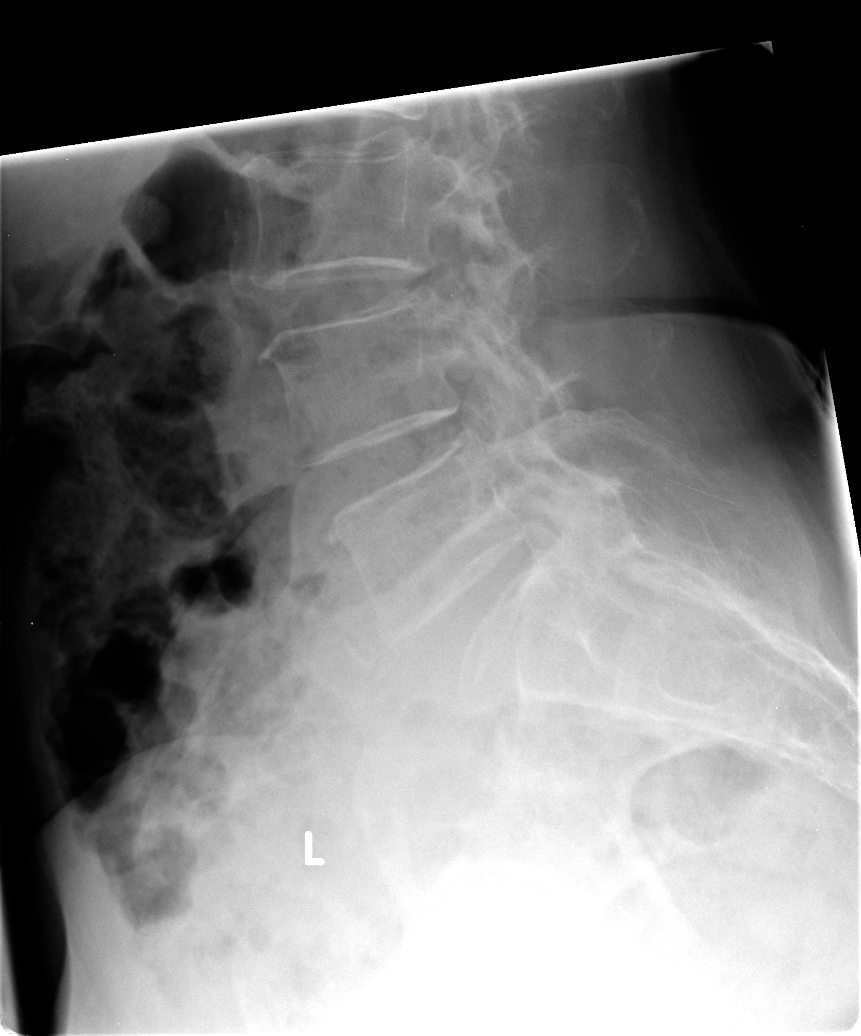

[5 of 5 positions shown; findings below may reference images not displayed]

FINDINGS: The lumbar vertebrae are in normal alignment. Intervertebral disc
spaces appear normal. No compression deformity of the lumbar spine
is seen. However on the lateral view there does appear to be partial
compression deformity of T12 vertebral body of uncertain age.
Clinical correlation is recommended. If necessary MRI would be
helpful to assess acuity.
IMPRESSION: 1. Negative lumbar spine.  Normal alignment.
2. Partial compression deformity of T12 of uncertain age. Consider
MRI to assess acuity if warranted clinically.

## 2016-08-08 DIAGNOSIS — R69 Illness, unspecified: Secondary | ICD-10-CM | POA: Diagnosis not present

## 2016-09-27 DIAGNOSIS — H2512 Age-related nuclear cataract, left eye: Secondary | ICD-10-CM | POA: Diagnosis not present

## 2016-09-27 DIAGNOSIS — H25812 Combined forms of age-related cataract, left eye: Secondary | ICD-10-CM | POA: Diagnosis not present

## 2016-10-18 DIAGNOSIS — H2511 Age-related nuclear cataract, right eye: Secondary | ICD-10-CM | POA: Diagnosis not present

## 2016-10-18 DIAGNOSIS — H25811 Combined forms of age-related cataract, right eye: Secondary | ICD-10-CM | POA: Diagnosis not present

## 2016-10-26 DIAGNOSIS — H6123 Impacted cerumen, bilateral: Secondary | ICD-10-CM | POA: Diagnosis not present

## 2017-02-28 DIAGNOSIS — Z23 Encounter for immunization: Secondary | ICD-10-CM | POA: Diagnosis not present

## 2017-03-13 DIAGNOSIS — R69 Illness, unspecified: Secondary | ICD-10-CM | POA: Diagnosis not present

## 2017-03-16 DIAGNOSIS — H04123 Dry eye syndrome of bilateral lacrimal glands: Secondary | ICD-10-CM | POA: Diagnosis not present

## 2017-03-16 DIAGNOSIS — Z961 Presence of intraocular lens: Secondary | ICD-10-CM | POA: Diagnosis not present

## 2017-04-19 DIAGNOSIS — H6980 Other specified disorders of Eustachian tube, unspecified ear: Secondary | ICD-10-CM | POA: Diagnosis not present

## 2017-04-19 DIAGNOSIS — H6123 Impacted cerumen, bilateral: Secondary | ICD-10-CM | POA: Diagnosis not present

## 2017-05-17 DIAGNOSIS — Z0000 Encounter for general adult medical examination without abnormal findings: Secondary | ICD-10-CM | POA: Diagnosis not present

## 2017-05-17 DIAGNOSIS — M810 Age-related osteoporosis without current pathological fracture: Secondary | ICD-10-CM | POA: Diagnosis not present

## 2017-05-17 DIAGNOSIS — G4700 Insomnia, unspecified: Secondary | ICD-10-CM | POA: Diagnosis not present

## 2017-05-17 DIAGNOSIS — K589 Irritable bowel syndrome without diarrhea: Secondary | ICD-10-CM | POA: Diagnosis not present

## 2017-06-12 DIAGNOSIS — R69 Illness, unspecified: Secondary | ICD-10-CM | POA: Diagnosis not present

## 2017-08-09 DIAGNOSIS — M79645 Pain in left finger(s): Secondary | ICD-10-CM | POA: Diagnosis not present

## 2017-09-05 DIAGNOSIS — M810 Age-related osteoporosis without current pathological fracture: Secondary | ICD-10-CM | POA: Diagnosis not present

## 2017-09-05 DIAGNOSIS — K589 Irritable bowel syndrome without diarrhea: Secondary | ICD-10-CM | POA: Diagnosis not present

## 2017-09-05 DIAGNOSIS — T753XXA Motion sickness, initial encounter: Secondary | ICD-10-CM | POA: Diagnosis not present

## 2017-09-05 DIAGNOSIS — G4700 Insomnia, unspecified: Secondary | ICD-10-CM | POA: Diagnosis not present

## 2017-09-18 DIAGNOSIS — R69 Illness, unspecified: Secondary | ICD-10-CM | POA: Diagnosis not present

## 2017-09-25 DIAGNOSIS — Z961 Presence of intraocular lens: Secondary | ICD-10-CM | POA: Diagnosis not present

## 2017-09-25 DIAGNOSIS — H524 Presbyopia: Secondary | ICD-10-CM | POA: Diagnosis not present

## 2017-09-25 DIAGNOSIS — H04123 Dry eye syndrome of bilateral lacrimal glands: Secondary | ICD-10-CM | POA: Diagnosis not present

## 2017-10-18 DIAGNOSIS — H6123 Impacted cerumen, bilateral: Secondary | ICD-10-CM | POA: Diagnosis not present

## 2018-03-04 DIAGNOSIS — Z23 Encounter for immunization: Secondary | ICD-10-CM | POA: Diagnosis not present

## 2018-03-26 DIAGNOSIS — R69 Illness, unspecified: Secondary | ICD-10-CM | POA: Diagnosis not present

## 2018-03-28 DIAGNOSIS — H6123 Impacted cerumen, bilateral: Secondary | ICD-10-CM | POA: Diagnosis not present

## 2018-04-02 DIAGNOSIS — H5213 Myopia, bilateral: Secondary | ICD-10-CM | POA: Diagnosis not present

## 2018-04-02 DIAGNOSIS — H04121 Dry eye syndrome of right lacrimal gland: Secondary | ICD-10-CM | POA: Diagnosis not present

## 2018-04-02 DIAGNOSIS — H04122 Dry eye syndrome of left lacrimal gland: Secondary | ICD-10-CM | POA: Diagnosis not present

## 2018-05-14 DIAGNOSIS — H04123 Dry eye syndrome of bilateral lacrimal glands: Secondary | ICD-10-CM | POA: Diagnosis not present

## 2018-05-15 DIAGNOSIS — R350 Frequency of micturition: Secondary | ICD-10-CM | POA: Diagnosis not present

## 2018-06-07 DIAGNOSIS — Z136 Encounter for screening for cardiovascular disorders: Secondary | ICD-10-CM | POA: Diagnosis not present

## 2018-06-07 DIAGNOSIS — M810 Age-related osteoporosis without current pathological fracture: Secondary | ICD-10-CM | POA: Diagnosis not present

## 2018-06-07 DIAGNOSIS — G4700 Insomnia, unspecified: Secondary | ICD-10-CM | POA: Diagnosis not present

## 2018-06-07 DIAGNOSIS — E559 Vitamin D deficiency, unspecified: Secondary | ICD-10-CM | POA: Diagnosis not present

## 2018-06-07 DIAGNOSIS — Z1211 Encounter for screening for malignant neoplasm of colon: Secondary | ICD-10-CM | POA: Diagnosis not present

## 2018-06-07 DIAGNOSIS — Z0000 Encounter for general adult medical examination without abnormal findings: Secondary | ICD-10-CM | POA: Diagnosis not present

## 2018-06-07 DIAGNOSIS — K589 Irritable bowel syndrome without diarrhea: Secondary | ICD-10-CM | POA: Diagnosis not present

## 2018-06-07 DIAGNOSIS — Z131 Encounter for screening for diabetes mellitus: Secondary | ICD-10-CM | POA: Diagnosis not present

## 2018-06-26 DIAGNOSIS — M8588 Other specified disorders of bone density and structure, other site: Secondary | ICD-10-CM | POA: Diagnosis not present

## 2018-06-26 DIAGNOSIS — M810 Age-related osteoporosis without current pathological fracture: Secondary | ICD-10-CM | POA: Diagnosis not present

## 2018-10-18 DIAGNOSIS — H04123 Dry eye syndrome of bilateral lacrimal glands: Secondary | ICD-10-CM | POA: Diagnosis not present

## 2018-11-18 DIAGNOSIS — K589 Irritable bowel syndrome without diarrhea: Secondary | ICD-10-CM | POA: Diagnosis not present

## 2018-11-18 DIAGNOSIS — M25561 Pain in right knee: Secondary | ICD-10-CM | POA: Diagnosis not present

## 2018-11-18 DIAGNOSIS — G4700 Insomnia, unspecified: Secondary | ICD-10-CM | POA: Diagnosis not present

## 2018-11-21 DIAGNOSIS — H9313 Tinnitus, bilateral: Secondary | ICD-10-CM | POA: Diagnosis not present

## 2018-11-21 DIAGNOSIS — H6123 Impacted cerumen, bilateral: Secondary | ICD-10-CM | POA: Diagnosis not present

## 2018-12-25 DIAGNOSIS — H903 Sensorineural hearing loss, bilateral: Secondary | ICD-10-CM | POA: Diagnosis not present

## 2019-01-16 DIAGNOSIS — H6980 Other specified disorders of Eustachian tube, unspecified ear: Secondary | ICD-10-CM | POA: Diagnosis not present

## 2019-02-13 DIAGNOSIS — R69 Illness, unspecified: Secondary | ICD-10-CM | POA: Diagnosis not present

## 2019-02-27 DIAGNOSIS — Z23 Encounter for immunization: Secondary | ICD-10-CM | POA: Diagnosis not present

## 2019-03-06 DIAGNOSIS — Z9622 Myringotomy tube(s) status: Secondary | ICD-10-CM | POA: Diagnosis not present

## 2019-03-06 DIAGNOSIS — H6982 Other specified disorders of Eustachian tube, left ear: Secondary | ICD-10-CM | POA: Diagnosis not present

## 2019-03-21 DIAGNOSIS — H5213 Myopia, bilateral: Secondary | ICD-10-CM | POA: Diagnosis not present

## 2019-03-21 DIAGNOSIS — H04123 Dry eye syndrome of bilateral lacrimal glands: Secondary | ICD-10-CM | POA: Diagnosis not present

## 2019-03-31 DIAGNOSIS — Z961 Presence of intraocular lens: Secondary | ICD-10-CM | POA: Diagnosis not present

## 2019-06-09 DIAGNOSIS — M810 Age-related osteoporosis without current pathological fracture: Secondary | ICD-10-CM | POA: Diagnosis not present

## 2019-06-09 DIAGNOSIS — R011 Cardiac murmur, unspecified: Secondary | ICD-10-CM | POA: Diagnosis not present

## 2019-06-09 DIAGNOSIS — Z1211 Encounter for screening for malignant neoplasm of colon: Secondary | ICD-10-CM | POA: Diagnosis not present

## 2019-06-09 DIAGNOSIS — G4700 Insomnia, unspecified: Secondary | ICD-10-CM | POA: Diagnosis not present

## 2019-06-09 DIAGNOSIS — K589 Irritable bowel syndrome without diarrhea: Secondary | ICD-10-CM | POA: Diagnosis not present

## 2019-06-09 DIAGNOSIS — Z0000 Encounter for general adult medical examination without abnormal findings: Secondary | ICD-10-CM | POA: Diagnosis not present

## 2019-06-10 DIAGNOSIS — Z1211 Encounter for screening for malignant neoplasm of colon: Secondary | ICD-10-CM | POA: Diagnosis not present

## 2019-06-11 ENCOUNTER — Encounter: Payer: Self-pay | Admitting: Gastroenterology

## 2019-06-12 DIAGNOSIS — H6980 Other specified disorders of Eustachian tube, unspecified ear: Secondary | ICD-10-CM | POA: Diagnosis not present

## 2019-06-18 NOTE — Progress Notes (Addendum)
Cardiology Office Note   Date:  06/20/2019   ID:  Lynn Hayden, Lynn Hayden 1940/06/28, MRN [id number]  PCP:  [name], MD Cardiologist:   No primary care provider on file. Referring:  [name], MD  Chief Complaint  Patient presents with  . Chest Pain      History of Present Illness: Lynn Hayden is a 79 y.o. female who is referred by [name], MD for evaluation of chest pain.    She had atypical pain in 2013 and she had a negative POET (Plain Old Exercise Treadmill).  I saw her in 2016.  There was no evidence of ischemia on a Lexiscan Myoview.  She is also had mild septal hypertrophy on echo.  She is very active.  She still works.  She does all the household chores.  She has a disabled son who does not live with her but she helps to take care of him.  She does not sleep very much.  She has a sensation of quivering in her chest.  She feels shaky but she is not really describing tachypalpitations.  She has some chest pressure.  This occurs with activities.  It happens if she pushes a vacuum or walks up an incline.  It seems to be recurring over the past year.  Seems to be more frequent.  She keeps doing what she is doing and it really starts to go away after 10 minutes.  She thinks her breathing is okay.  She is not having any resting shortness of breath, PND or orthopnea.  She has had no palpitations, presyncope or syncope.  He has had no weight gain or edema.  She is had no further cardiac testing since I saw her previously.   Past Medical History:  Diagnosis Date  . Arthritis   . Chronic diarrhea   . Hemorrhoid   . Hypertrophic cardiomegaly   . IBS (irritable bowel syndrome)   . Insomnia   . Miscarriage   . Osteoporosis   . Thyromegaly   . Vitamin D deficiency     Past Surgical History:  Procedure Laterality Date  . CATARACT EXTRACTION    . DILATION AND CURETTAGE OF UTERUS    . EAR CYST EXCISION  11/09/2011   Procedure: CYST REMOVAL;  Surgeon: Tami Ribas, MD;  Location: [facility];  Service: Orthopedics;  Laterality: Right;  right index excision cyst/foreign body and debridement DIP joint  . GANGLION CYST EXCISION     rt wrist  . GANGLION CYST EXCISION    . TONSILLECTOMY    . WISDOM TOOTH EXTRACTION       Current Outpatient Medications  Medication Sig Dispense Refill  . Calcium Carb-Cholecalciferol [phone] MG-UNIT TABS Take by mouth daily.    . cholecalciferol (VITAMIN D) 1000 UNITS tablet Take 1,000 Units by mouth daily. Takes 2    . diphenoxylate-atropine (LOMOTIL) 2.5-0.025 MG per tablet Take 1 tablet by mouth as needed for diarrhea or loose stools. 30 tablet 4  . temazepam (RESTORIL) 30 MG capsule Take 1 capsule (30 mg total) by mouth at bedtime as needed. 30 capsule 5   No current facility-administered medications for this visit.    Allergies:   Amoxicillin    Social History:  The patient  reports that she quit smoking about 30 years ago. She has never used smokeless tobacco. She reports current alcohol use. She reports that she does not use drugs.   Family History:  The patient's family  history includes Dementia in her father; Diabetes in her father; Lymphoma in her sister; Vascular Disease in her mother.    ROS:  Please see the history of present illness.   Otherwise, review of systems are positive for none.   All other systems are reviewed and negative.    PHYSICAL EXAM: VS:  BP 138/90   Pulse 81   Ht 5\' 5"  (1.651 m)   Wt 108 lb (49 kg)   BMI 17.97 kg/m  , BMI Body mass index is 17.97 kg/m. GENERAL:  Well appearing HEENT:  Pupils equal round and reactive, fundi not visualized, oral mucosa unremarkable NECK:  No jugular venous distention, waveform within normal limits, carotid upstroke brisk and symmetric, no bruits, no thyromegaly LYMPHATICS:  No cervical, inguinal adenopathy LUNGS:  Clear to auscultation bilaterally BACK:  No CVA tenderness CHEST:  Unremarkable HEART:  PMI not displaced or  sustained,S1 and S2 within normal limits, no S3, no S4, no clicks, no rubs, 3 out of 6 apical systolic murmur radiating up aortic outflow tract and increasing with the strain phase of Valsalva, no diastolic murmurs ABD:  Flat, positive bowel sounds normal in frequency in pitch, no bruits, no rebound, no guarding, no midline pulsatile mass, no hepatomegaly, no splenomegaly EXT:  2 plus pulses throughout, no edema, no cyanosis no clubbing SKIN:  No rashes no nodules NEURO:  Cranial nerves II through XII grossly intact, motor grossly intact throughout PSYCH:  Cognitively intact, oriented to person place and time    EKG:  EKG is ordered today. The ekg ordered today demonstrates sinus rhythm, rate 81, axis within normal limits, intervals within normal limits, no acute ST-T wave changes.   Recent Labs: No results found for requested labs within last 8760 hours.    Lipid Panel No results found for: CHOL, TRIG, HDL, CHOLHDL, VLDL, LDLCALC, LDLDIRECT    Wt Readings from Last 3 Encounters:  06/20/19 108 lb (49 kg)  03/12/15 126 lb (57.2 kg)  01/06/15 125 lb (56.7 kg)      Other studies Reviewed: Additional studies/ records that were reviewed today include: Previous POET (Plain Old Exercise Treadmill), Lexiscan Myoview and echo. Review of the above records demonstrates:  Please see elsewhere in the note.     ASSESSMENT AND PLAN:  CHEST PAIN:   Chest pain is somewhat atypical.  However, it is new in onset and has some typical features and she has some risk factors. I will bring the patient back for a POET (Plain Old Exercise Test). This will allow me to screen for obstructive coronary disease, risk stratify and very importantly provide a prescription for exercise.  ASYMMETRIC SEPTAL HYPERTROPHY: She does have a murmur increase with Valsalva and known mild septal hypertrophy.  She needs a follow-up echocardiogram.  This should be done with Valsalva to see if we have any increased inducible  gradient.  COVID EDUCATION: We talked about the vaccine and she be anxious to get this and we gave her the telephone number.  Current medicines are reviewed at length with the patient today.  The patient does not have concerns regarding medicines.  The following changes have been made:  no change  Labs/ tests ordered today include:   Orders Placed This Encounter  Procedures  . Myocardial Perfusion Imaging  . EKG 12-Lead  . ECHOCARDIOGRAM COMPLETE     Disposition:   FU with me in 12 months.     Signed, Minus Breeding, MD  06/20/2019 12:53 PM    [facility]

## 2019-06-20 ENCOUNTER — Encounter (INDEPENDENT_AMBULATORY_CARE_PROVIDER_SITE_OTHER): Payer: Self-pay

## 2019-06-20 ENCOUNTER — Encounter: Payer: Self-pay | Admitting: Cardiology

## 2019-06-20 ENCOUNTER — Other Ambulatory Visit: Payer: Self-pay

## 2019-06-20 ENCOUNTER — Ambulatory Visit: Payer: Medicare HMO | Admitting: Cardiology

## 2019-06-20 VITALS — BP 138/90 | HR 81 | Ht 65.0 in | Wt 108.0 lb

## 2019-06-20 DIAGNOSIS — I422 Other hypertrophic cardiomyopathy: Secondary | ICD-10-CM

## 2019-06-20 DIAGNOSIS — R072 Precordial pain: Secondary | ICD-10-CM | POA: Diagnosis not present

## 2019-06-20 DIAGNOSIS — Z7189 Other specified counseling: Secondary | ICD-10-CM | POA: Diagnosis not present

## 2019-06-20 NOTE — Patient Instructions (Addendum)
Medication Instructions:  No changes *If you need a refill on your cardiac medications before your next appointment, please call your pharmacy*  Lab Work: None Testing/Procedures: Your physician has requested that you have an echocardiogram. Echocardiography is a painless test that uses sound waves to create images of your heart. It provides your doctor with information about the size and shape of your heart and how well your heart's chambers and valves are working. This procedure takes approximately one hour. There are no restrictions for this procedure. [address]  Your physician has requested that you have a Scientist, physiological. For further information please visit [email or web address] Please follow instruction sheet, as given. [address]  Follow-Up: At [facility], you and your health needs are our priority.  As part of our continuing mission to provide you with exceptional heart care, we have created designated Provider Care Teams.  These Care Teams include your primary Cardiologist (physician) and Advanced Practice Providers (APPs -  Physician Assistants and Nurse Practitioners) who all work together to provide you with the care you need, when you need it.  Your next appointment:   1 year(s) You will receive a reminder letter in the mail two months in advance. If you don't receive a letter, please call our office to schedule the follow-up appointment.  The format for your next appointment:   In Person  Provider:   Rollene Rotunda, MD  CALL [phone] TO SCHEDULE YOUR COVID VACCINE. CALL DAILY TO SEE IF ANY NEW SPOTS HAVE OPENED. THEY GIVE THE VACCINE AT:  [facility], [address], [place], [place] [address]   [facility] at [facility], [address], [place], [place] [address]   [facility] [address] [place], [place] [address]

## 2019-06-30 ENCOUNTER — Ambulatory Visit: Payer: Medicare HMO | Admitting: Gastroenterology

## 2019-07-01 ENCOUNTER — Other Ambulatory Visit: Payer: Self-pay

## 2019-07-01 DIAGNOSIS — R072 Precordial pain: Secondary | ICD-10-CM

## 2019-07-01 NOTE — Addendum Note (Signed)
Addended by: Teressa Senter on: 07/01/2019 10:47 AM   Modules accepted: Orders

## 2019-07-01 NOTE — Progress Notes (Unsigned)
New order for exercise tolerance test placed per Dr. Antoine Poche. Lexiscan cancelled.

## 2019-07-10 ENCOUNTER — Ambulatory Visit: Payer: Medicare HMO | Admitting: Gastroenterology

## 2019-07-11 ENCOUNTER — Other Ambulatory Visit: Payer: Self-pay

## 2019-07-11 ENCOUNTER — Ambulatory Visit (HOSPITAL_COMMUNITY): Payer: Medicare HMO | Attending: Cardiovascular Disease

## 2019-07-11 ENCOUNTER — Telehealth: Payer: Self-pay

## 2019-07-11 DIAGNOSIS — I422 Other hypertrophic cardiomyopathy: Secondary | ICD-10-CM | POA: Diagnosis not present

## 2019-07-11 NOTE — Telephone Encounter (Signed)
If she cannot quarantine for a POET I would suggest a [employer].

## 2019-07-11 NOTE — Telephone Encounter (Signed)
Patient in today for echo.  The echo tech came to DOD pod to report frequent PACs and HR low 100s on monitor.  The patient also complained of CP on exertion.  Assessed the patient. She is currently asymptomatic and feels "great." She describes chest tightness when exerting herself (like walking uphill or vacuuming).  BP 150/70, HR 80 She states she cancelled her GXT because she takes care of her disabled son and cannot quarantine after COVID screening.  Reviewed with Dr. Excell Seltzer, who will defer to Dr. Antoine Poche for further testing recommendations.  The patient was discharged and understands she will be called with echo results and recs. She was grateful for assistance.

## 2019-07-14 NOTE — Telephone Encounter (Signed)
Tried to call patient, unable to leave message mailbox full

## 2019-07-15 NOTE — Telephone Encounter (Signed)
Spoke with patient advised patient of recommendation to have Lexiscan if she cannot quarantine for [employer]. Patient states she wants to think about it and will call back. She may decide on the POET instead of the lexiscan.

## 2019-07-16 ENCOUNTER — Inpatient Hospital Stay (HOSPITAL_COMMUNITY): Admission: RE | Admit: 2019-07-16 | Payer: Medicare HMO | Source: Ambulatory Visit

## 2019-07-29 ENCOUNTER — Encounter: Payer: Self-pay | Admitting: Family Medicine

## 2019-08-18 DIAGNOSIS — R42 Dizziness and giddiness: Secondary | ICD-10-CM | POA: Diagnosis not present

## 2019-08-24 NOTE — Progress Notes (Unsigned)
{Choose 1 Note Type (Telehealth Visit or Telephone Visit):[phone]}   Date:  08/24/2019   ID:  Lynn Hayden, DOB 1941/02/24, MRN [id number]  {Patient Location:[phone]::"Home"} {Provider [phone]::"Home"}  PCP:  [name], MD  Cardiologist:  Dr. Antoine Poche  Electrophysiologist:  None   Evaluation Performed:  {Choose Visit Type:[phone]::"Follow-Up Visit"}  Chief Complaint:  ***  History of Present Illness:    Lynn Hayden is a 79 y.o. female with for ongoing assessment and management of chest pain. She was scheduled for a Lexiscan Myoview for further evaluation when last seen by Dr. Antoine Poche on 06/20/2019 He also recommended an echocardiogram due to heart murmur.   Echo was completed on 07/11/2019 which revealed EF of 55%-60%. Unable to evaluate for diastolic dysfunction. No significant valvular abnormality was noted Moderately elevated pulmonary pressures.   The patient {does/does not:200015} have symptoms concerning for COVID-19 infection (fever, chills, cough, or new shortness of breath).    Past Medical History:  Diagnosis Date  . Arthritis   . Chronic diarrhea   . Hemorrhoid   . Hypertrophic cardiomegaly   . IBS (irritable bowel syndrome)   . Insomnia   . Miscarriage   . Osteoporosis   . Thyromegaly   . Vitamin D deficiency    Past Surgical History:  Procedure Laterality Date  . CATARACT EXTRACTION    . DILATION AND CURETTAGE OF UTERUS    . EAR CYST EXCISION  11/09/2011   Procedure: CYST REMOVAL;  Surgeon: Tami Ribas, MD;  [facility];  Service: Orthopedics;  Laterality: Right;  right index excision cyst/foreign body and debridement DIP joint  . GANGLION CYST EXCISION     rt wrist  . GANGLION CYST EXCISION    . TONSILLECTOMY    . WISDOM TOOTH EXTRACTION       No outpatient medications have been marked as taking for the 08/25/19 encounter (Appointment) with Jodelle Gross, NP.     Allergies:   Amoxicillin    Social History   Tobacco Use  . Smoking status: Former Smoker    Quit date: 11/06/1988    Years since quitting: 30.8  . Smokeless tobacco: Never Used  Substance Use Topics  . Alcohol use: Yes    Comment: rare  . Drug use: No     Family Hx: The patient's family history includes Dementia in her father; Diabetes in her father; Lymphoma in her sister; Vascular Disease in her mother.  ROS:   Please see the history of present illness.    *** All other systems reviewed and are negative.   Prior CV studies:   The following studies were reviewed today:  ***  Labs/Other Tests and Data Reviewed:    EKG:  {EKG/Telemetry Strips Reviewed:[id number]}  Recent Labs: No results found for requested labs within last 8760 hours.   Recent Lipid Panel No results found for: CHOL, TRIG, HDL, CHOLHDL, LDLCALC, LDLDIRECT  Wt Readings from Last 3 Encounters:  06/20/19 108 lb (49 kg)  03/12/15 126 lb (57.2 kg)  01/06/15 125 lb (56.7 kg)     Objective:    Vital Signs:  There were no vitals taken for this visit.   {HeartCare Virtual Exam (Optional):[phone]::"VITAL SIGNS:  reviewed"}  ASSESSMENT & PLAN:    1. ***  COVID-19 Education: The signs and symptoms of COVID-19 were discussed with the patient and how to seek care for testing (follow up with PCP or arrange E-visit).  ***The importance of social distancing was discussed today.  Time:  Today, I have spent *** minutes with the patient with telehealth technology discussing the above problems.     Medication Adjustments/Labs and Tests Ordered: Current medicines are reviewed at length with the patient today.  Concerns regarding medicines are outlined above.   Tests Ordered: No orders of the defined types were placed in this encounter.   Medication Changes: No orders of the defined types were placed in this encounter.   Disposition:  Follow up {follow up:15908}  Signed, Phill Myron. West Pugh, ANP, AACC  08/24/2019 12:22  PM    [facility]

## 2019-08-25 ENCOUNTER — Telehealth: Payer: Medicare HMO | Admitting: Adult Health

## 2019-08-25 ENCOUNTER — Emergency Department (HOSPITAL_COMMUNITY): Payer: Medicare HMO

## 2019-08-25 ENCOUNTER — Emergency Department (HOSPITAL_COMMUNITY)
Admission: EM | Admit: 2019-08-25 | Discharge: 2019-08-25 | Disposition: A | Discharge status: Home / Self Care | Payer: Medicare HMO | Attending: Emergency Medicine | Admitting: Emergency Medicine

## 2019-08-25 ENCOUNTER — Other Ambulatory Visit: Payer: Self-pay

## 2019-08-25 ENCOUNTER — Encounter (HOSPITAL_COMMUNITY): Payer: Self-pay | Admitting: Emergency Medicine

## 2019-08-25 DIAGNOSIS — E860 Dehydration: Secondary | ICD-10-CM

## 2019-08-25 DIAGNOSIS — R002 Palpitations: Secondary | ICD-10-CM | POA: Diagnosis present

## 2019-08-25 DIAGNOSIS — D6869 Other thrombophilia: Secondary | ICD-10-CM | POA: Diagnosis not present

## 2019-08-25 DIAGNOSIS — I4891 Unspecified atrial fibrillation: Secondary | ICD-10-CM | POA: Diagnosis not present

## 2019-08-25 DIAGNOSIS — I480 Paroxysmal atrial fibrillation: Secondary | ICD-10-CM | POA: Diagnosis not present

## 2019-08-25 DIAGNOSIS — Z87891 Personal history of nicotine dependence: Secondary | ICD-10-CM | POA: Insufficient documentation

## 2019-08-25 DIAGNOSIS — R42 Dizziness and giddiness: Secondary | ICD-10-CM | POA: Diagnosis not present

## 2019-08-25 DIAGNOSIS — R260 Ataxic gait: Secondary | ICD-10-CM | POA: Insufficient documentation

## 2019-08-25 DIAGNOSIS — Z79899 Other long term (current) drug therapy: Secondary | ICD-10-CM | POA: Diagnosis not present

## 2019-08-25 DIAGNOSIS — R270 Ataxia, unspecified: Secondary | ICD-10-CM | POA: Diagnosis not present

## 2019-08-25 DIAGNOSIS — R531 Weakness: Secondary | ICD-10-CM | POA: Diagnosis not present

## 2019-08-25 LAB — BASIC METABOLIC PANEL
Anion gap: 12 (ref 5–15)
BUN: 15 mg/dL (ref 8–23)
CO2: 21 mmol/L — ABNORMAL LOW (ref 22–32)
Calcium: 9.7 mg/dL (ref 8.9–10.3)
Chloride: 107 mmol/L (ref 98–111)
Creatinine, Ser: 0.67 mg/dL (ref 0.44–1.00)
GFR calc Af Amer: >60 mL/min (ref >60)
GFR calc non Af Amer: >60 mL/min (ref >60)
Glucose, Bld: 123 mg/dL — ABNORMAL HIGH (ref 70–99)
Potassium: 4 mmol/L (ref 3.5–5.1)
Sodium: 140 mmol/L (ref 135–145)

## 2019-08-25 LAB — CBC
HCT: 45.9 % (ref 36.0–46.0)
Hemoglobin: 14.5 g/dL (ref 12.0–15.0)
MCH: 27.7 pg (ref 26.0–34.0)
MCHC: 31.6 g/dL (ref 30.0–36.0)
MCV: 87.8 fL (ref 80.0–100.0)
Platelets: 177 10*3/uL (ref 150–400)
RBC: 5.23 MIL/uL — ABNORMAL HIGH (ref 3.87–5.11)
RDW: 13.1 % (ref 11.5–15.5)
WBC: 5 10*3/uL (ref 4.0–10.5)
nRBC: 0 % (ref 0.0–0.2)

## 2019-08-25 LAB — TROPONIN I (HIGH SENSITIVITY)
Troponin I (High Sensitivity): 17 ng/L (ref <18)
Troponin I (High Sensitivity): 43 ng/L — ABNORMAL HIGH (ref <18)
Troponin I (High Sensitivity): 46 ng/L — ABNORMAL HIGH (ref <18)

## 2019-08-25 MED ORDER — HEPARIN BOLUS VIA INFUSION
2500.0000 [IU] | Freq: Once | INTRAVENOUS | Status: AC
  Administered 2019-08-25: 2500 [IU] via INTRAVENOUS
  Filled 2019-08-25: qty 2500

## 2019-08-25 MED ORDER — SODIUM CHLORIDE 0.9 % IV BOLUS: 500.0000 mL | Freq: Once | INTRAVENOUS | Status: DC

## 2019-08-25 MED ORDER — DILTIAZEM HCL ER COATED BEADS 240 MG PO CP24
240.0000 mg | ORAL_CAPSULE | Freq: Every day | ORAL | Status: DC
  Administered 2019-08-25: 240 mg via ORAL
  Filled 2019-08-25: qty 1

## 2019-08-25 MED ORDER — DILTIAZEM HCL-DEXTROSE 125-5 MG/125ML-% IV SOLN (PREMIX)
5.0000 mg/h | INTRAVENOUS | Status: DC
  Administered 2019-08-25: 5 mg/h via INTRAVENOUS
  Filled 2019-08-25: qty 125

## 2019-08-25 MED ORDER — HEPARIN (PORCINE) 25000 UT/250ML-% IV SOLN
750.0000 [IU]/h | INTRAVENOUS | Status: DC
  Administered 2019-08-25: 750 [IU]/h via INTRAVENOUS
  Filled 2019-08-25: qty 250

## 2019-08-25 MED ORDER — DILTIAZEM LOAD VIA INFUSION
20.0000 mg | Freq: Once | INTRAVENOUS | Status: AC
  Administered 2019-08-25: 20 mg via INTRAVENOUS
  Filled 2019-08-25: qty 20

## 2019-08-25 MED ORDER — SODIUM CHLORIDE 0.9% FLUSH
3.0000 mL | Freq: Once | INTRAVENOUS | Status: AC
  Administered 2019-08-25: 3 mL via INTRAVENOUS

## 2019-08-25 MED ORDER — ONDANSETRON 4 MG PO TBDP: 4.0000 mg | ORAL_TABLET | Freq: Three times a day (TID) | ORAL | 0 refills | Status: DC | PRN

## 2019-08-25 MED ORDER — DILTIAZEM HCL ER COATED BEADS 120 MG PO CP24: 120.0000 mg | ORAL_CAPSULE | Freq: Every day | ORAL | Status: DC

## 2019-08-25 MED ORDER — APIXABAN 5 MG PO TABS
5.0000 mg | ORAL_TABLET | Freq: Two times a day (BID) | ORAL | Status: DC
  Administered 2019-08-25: 5 mg via ORAL
  Filled 2019-08-25 (×2): qty 1

## 2019-08-25 MED ORDER — ONDANSETRON HCL 4 MG/2ML IJ SOLN
4.0000 mg | Freq: Once | INTRAMUSCULAR | Status: AC
  Administered 2019-08-25: 4 mg via INTRAVENOUS
  Filled 2019-08-25: qty 2

## 2019-08-25 NOTE — H&P (Addendum)
History & Physical    Patient ID: Lynn Hayden MRN: [id number], DOB/AGE: Nov 02, 1940   Admit date: 08/25/2019  Primary Physician: [name], MD Primary Cardiologist: Dr. Rollene Rotunda, MD  Patient Profile    Lynn Hayden is a 79 year old female with a history of hypertrophic cardiomegaly, arthritis, osteoporosis and IBS who presented to [facility] on 08/25/2019 with dizziness, nausea and vomiting found to be in new onset AF with RVR.   Past Medical History   Past Medical History:  Diagnosis Date  . Arthritis   . Chronic diarrhea   . Hemorrhoid   . Hypertrophic cardiomegaly   . IBS (irritable bowel syndrome)   . Insomnia   . Miscarriage   . Osteoporosis   . Thyromegaly   . Vitamin D deficiency     Past Surgical History:  Procedure Laterality Date  . CATARACT EXTRACTION    . DILATION AND CURETTAGE OF UTERUS    . EAR CYST EXCISION  11/09/2011   Procedure: CYST REMOVAL;  Surgeon: Tami Ribas, MD;  Location: [facility];  Service: Orthopedics;  Laterality: Right;  right index excision cyst/foreign body and debridement DIP joint  . GANGLION CYST EXCISION     rt wrist  . GANGLION CYST EXCISION    . TONSILLECTOMY    . WISDOM TOOTH EXTRACTION       Allergies  Allergies  Allergen Reactions  . Amoxicillin Rash   History of Present Illness    Lynn Hayden initially presented to [facility] on 08/25/2019 with complaints of weakness and dizziness for approximately 1 week however has been having ongoing palpitations for quite some time. Patient reports that on morning of presentation her symptoms seem to be more persistent and were associated with lightheadedness, nausea and one vomiting episode. Given this, her daughter brought her to the ED for further evaluation.   In the ED, CXR with no acute cardiopulmonary changes. EKG with atrial fibrillation with RVR with a rate at 155 bpm and no acute ST-T wave abnormalities. IV diltiazem infusion with bolus was initiated along with  IV heparin for anticoagulation. Labs were found to be stable with a potassium at 4.0, creatinine at 0.67 and hemoglobin at 14.5. \High-sensitivity troponin found to be elevated at 17 however likely in the setting of elevated rates with AF w/ RVR given no chest pain complaints and no CAD hx. On my interview, telemetry shows conversion to NSR with rates in the 90's. She denies recurrent dizziness, nausea, SOB, LE edema, orthopnea or syncope.   Lynn Hayden was initially referred to Dr. Antoine Poche by her PCP, Dr. Izola Price for the evaluation of chest pain in 2013.  At that time, she underwent an exercise stress test study which was found to be negative. She was then seen in 2016 with recurrent symptoms.  She then underwent a Lexiscan stress test 06/18/2014 which showed normal LV function and normal wall motion with no evidence of ischemia.  Most recently she was seen by Dr. Antoine Poche on 06/20/2019 in follow-up at which time she described a sensation of "quivering in her chest" without a true description of tachypalpitations.  She did have some complaints of chest pressure with activity however no change over the course of the year.  Plan was to obtain an exercise stress test to fully evaluate for obstructive coronary disease which is currently scheduled for 4/6/202.    Home Medications    Prior to Admission medications   Medication Sig Start Date End Date Taking? Authorizing Provider  Calcium Carb-Cholecalciferol [phone] MG-UNIT TABS Take 2 tablets by mouth daily.    Yes [provider]  diphenoxylate-atropine (LOMOTIL) 2.5-0.025 MG per tablet Take 1 tablet by mouth as needed for diarrhea or loose stools. 12/14/14  Yes Weaver, Layne C, NP  temazepam (RESTORIL) 30 MG capsule Take 1 capsule (30 mg total) by mouth at bedtime as needed. Patient taking differently: Take 15 mg by mouth at bedtime.  12/14/14  Yes Kelle Darting, NP   Family History    Family History  Problem Relation Age of Onset  . Vascular  Disease Mother   . Diabetes Father   . Dementia Father   . Lymphoma Sister     Social History    Social History   Socioeconomic History  . Marital status: Married    Spouse name: Not on file  . Number of children: 3  . Years of education: Not on file  . Highest education level: Not on file  Occupational History    Comment: retired  Tobacco Use  . Smoking status: Former Smoker    Quit date: 11/06/1988    Years since quitting: 30.8  . Smokeless tobacco: Never Used  Substance and Sexual Activity  . Alcohol use: Yes    Comment: rare  . Drug use: No  . Sexual activity: Not Currently  Other Topics Concern  . Not on file  Social History Narrative   Lives with husband.  Has 2 biological children & 1 adopted child.  Takes care of disables son.  Still works and does all the housework   Social Determinants of Corporate investment banker Strain:   . Difficulty of Paying Living Expenses:   Food Insecurity:   . Worried About Programme researcher, broadcasting/film/video in the Last Year:   . Barista in the Last Year:   Transportation Needs:   . Freight forwarder (Medical):   Marland Kitchen Lack of Transportation (Non-Medical):   Physical Activity:   . Days of Exercise per Week:   . Minutes of Exercise per Session:   Stress:   . Feeling of Stress :   Social Connections:   . Frequency of Communication with Friends and Family:   . Frequency of Social Gatherings with Friends and Family:   . Attends Religious Services:   . Active Member of Clubs or Organizations:   . Attends Banker Meetings:   Marland Kitchen Marital Status:   Intimate Partner Violence:   . Fear of Current or Ex-Partner:   . Emotionally Abused:   Marland Kitchen Physically Abused:   . Sexually Abused:      Review of Systems   General: Denies fevers, chills, myalgias, decreased appetite and fatigue. Admits to good overall health. Cardiovascular: Denies chest pain, palpitations, and SOB at rest or with exertion. Denies lower extremity swelling.    Respiratory- Denies SOB. Denies the need for extra pillows for sleep at night.  Gastrointestinal: + nausea and vomiting. No reports of heartburn, indigestion Neurological: Denies headaches, syncope, falls, numbness, and tingling. No changes in mental status. Denies abnormal anxiousness or restlessness.  All other systems reviewed and are otherwise negative except as noted above.  Physical Exam    Blood pressure (!) 148/72, pulse 91, temperature 98.1 F (36.7 C), temperature source Oral, resp. rate (!) 23, height 5\' 4"  (1.626 m), weight 49.9 kg, SpO2 97 %.   General: Well developed, well nourished, NAD Head: Normocephalic, atraumatic, sclera non-icteric, no xanthomas, clear, moist mucus membranes. Neck: Negative for  carotid bruits. No JVD Lungs:Clear to ausculation bilaterally. No wheezes, rales, or rhonchi. Breathing is unlabored. Cardiovascular: RRR with S1 S2. No murmurs Abdomen: Soft, non-tender, non-distended. No obvious abdominal masses. Extremities: No edema. Radial pulses 2+ bilaterally Neuro: Alert and oriented. No focal deficits. No facial asymmetry. MAE spontaneously. Psych: Responds to questions appropriately with normal affect.    Labs    Troponin (Point of Care Test) No results for input(s): TROPIPOC in the last 72 hours. No results for input(s): CKTOTAL, CKMB, TROPONINI in the last 72 hours. Lab Results  Component Value Date   WBC 5.0 08/25/2019   HGB 14.5 08/25/2019   HCT 45.9 08/25/2019   MCV 87.8 08/25/2019   PLT 177 08/25/2019    Recent Labs  Lab 08/25/19 0944  NA 140  K 4.0  CL 107  CO2 21*  BUN 15  CREATININE 0.67  CALCIUM 9.7  GLUCOSE 123*   No results found for: CHOL, HDL, LDLCALC, TRIG No results found for: [facility]   Radiology Studies    DG Chest 2 View  Result Date: 08/25/2019 CLINICAL DATA:  Weakness and dizziness with vomiting EXAM: CHEST - 2 VIEW COMPARISON:  None. FINDINGS: There is a probable nipple shadow on the right. No edema or  airspace opacity evident. Heart size and pulmonary vascularity are normal. No adenopathy. There is aortic atherosclerosis. There is anterior wedging of a lower thoracic vertebral body. IMPRESSION: Probable nipple shadow on the right; repeat study with nipple markers could confirm. No edema or airspace opacity. Cardiac silhouette within normal limits. Aortic Atherosclerosis (ICD10-I70.0). Electronically Signed   By: Lowella Grip III M.D.   On: 08/25/2019 09:25   ECG & Cardiac Imaging    EKG 08/25/19: Atrial fibrillation with RVR, rate 155bpm   Assessment & Plan    1.  New onset atrial fibrillation with RVR: -Patient reports a 1 week history of fatigue, dizziness and shortness of breath with exertion found to be in atrial fibrillation with RVR on ED presentation with a rate at 155 bpm.  -IV diltiazem infusion with bolus initiated for rate control with conversion to NSR with rates in the 90's -Will transition to Diltiazem PO 240mg  prior to ED discharge -May need low dose metoprolol 12.5mg  PO BID for greater rate control if continues to be elevated on diltiazem  -Would transition to DOAC with Eliquis 5mg  PO BID for AC -No hx of hemorraghic CVA or GI bleed  -Plan for close cardiology f/u and if recurrence, consider OP DCCV after 3-4 weeks of AC -Has OP Lexiscan already scheduled for Dr. Kiley Solimine>>>keep study scheduled given elevated HST -CHA2DS2VASc = 3 (age +55, female)  2.  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  There was trivial pericardial effusion present and no valvular disease noted.  Notably elevated pulmonary artery systolic pressure 08.6 mmHg (previously 33 mmHg per echo 07/22/2013).  -Will need continued follow up with serial echocardiograms to follow -Consider  referral to pulmonary medicine if symptoms occur   Signed, Kathyrn Drown NP-C [facility] Pager: [phone] 08/25/2019, @[facility]   History and all data above reviewed.  Patient examined.  I agree with the findings as above.  The patient presents with light headedness and her chest pounding.   She is found to be in atrial fib with a rapid rate.  She is not having any chest pain.  She converted back to NSR on IV Cardizem.  She did have a second hs Trop that was elevated.  However, now that she is back in NSR she has no acute complaints.  She has been having this feeling for some time but the events are more frequent and today it persisted and she felt week and presyncopal.  She has not had syncope.  she does have some mild DOE climbing the 25 yards from the mailbox to the house.  This has been chronic.  Again, The patient denies any new symptoms such as chest discomfort, neck or arm discomfort. There has been no PND or orthopnea. The patient exam reveals COR:RRR  ,  Lungs: Clear  ,  Abd: Positive bowel sounds, no rebound no guarding, Ext No edema  .  All available labs, radiology testing, previous records reviewed. Agree with documented assessment and plan.   Atrial fib:  Paroxysmal.  OK to start on Cardizem and Eliquis.  No contraindication to anticoagulation.  I had a long discussion with the patient and her daughter.  Enzyme is likely related to elevated HR.  We will arrange a Lexiscan Myoview.  HCM:  No septal enlargement or evidence of hypertrophic CM.  She does have some elevated pulmonary pressures.  When I see her back I will consider repeating an echo in six months vs right heart cath.  We will be arranging follow up with seven days or so with one of our APPs to follow up.    Fayrene Fearing Jaleigh Mccroskey  2:42 PM  08/25/2019

## 2019-08-25 NOTE — ED Notes (Signed)
Pt was ambulated in the hallway. Pt reported felling dizzy/ lightheaded after standing and needed a moment to catch her bearing. While ambulating, pt stumbled some but was able to stop and regain composure. Pt and family reports that this is not her normal. Dr Hyacinth Meeker was present at the end of the exercise to ask pt follow up questions.

## 2019-08-25 NOTE — ED Provider Notes (Signed)
  Physical Exam  BP (!) 145/80 (BP Location: Right Arm)   Pulse (!) 111   Temp 98.1 F (36.7 C) (Oral)   Resp 15   Ht 5\' 4"  (1.626 m)   Wt 49.9 kg   SpO2 98%   BMI 18.88 kg/m   Physical Exam  ED Course/Procedures     Procedures  MDM  Received patient in signout.  Had A. fib with RVR.  Mild bump in troponin however is converted to sinus rhythm.  Feels better.  Also had some nausea.  Lightheadedness.  Appears to be more lightheadedness than vertigo.  Head CT reassuring.  Feels better.  Has had some fluids and heart rate is improved.  States she did better with standing up.  Will discharge home with PCP follow-up cardiology follow-up and antiemetics        , MD 08/25/19 [phone]

## 2019-08-25 NOTE — Progress Notes (Addendum)
ANTICOAGULATION CONSULT NOTE - Initial Consult  Pharmacy Consult for apixaban Indication: atrial fibrillation  Allergies  Allergen Reactions  . Amoxicillin Rash    Patient Measurements: Height: 5\' 4"  (162.6 cm) Weight: 110 lb (49.9 kg) IBW/kg (Calculated) : 54.7 Heparin Dosing Weight: N/A  Vital Signs: Temp: 98.1 F (36.7 C) (03/22 0856) Temp Source: Oral (03/22 0856) BP: 144/86 (03/22 1230) Pulse Rate: 95 (03/22 1230)  Labs: Recent Labs    08/25/19 0944 08/25/19 1126  HGB 14.5  --   HCT 45.9  --   PLT 177  --   CREATININE 0.67  --   TROPONINIHS 17 43*    Estimated Creatinine Clearance: 45.7 mL/min (by C-G formula based on SCr of 0.67 mg/dL).   Medical History: Past Medical History:  Diagnosis Date  . Arthritis   . Chronic diarrhea   . Hemorrhoid   . Hypertrophic cardiomegaly   . IBS (irritable bowel syndrome)   . Insomnia   . Miscarriage   . Osteoporosis   . Thyromegaly   . Vitamin D deficiency     Medications:  Scheduled:  . apixaban  5 mg Oral BID  . diltiazem  240 mg Oral Daily   Infusions:    Assessment: Lynn Hayden is a 79 y/o F who presented with dizziness/weakness, in afib not on anticoagulation PTA. She was started on heparin and will now be transitioned to apixaban.  CHA2DS2VASc = 3 (age +79, female)  CBC WNL  Goal of Therapy:  Monitor platelets by anticoagulation protocol: Yes   Plan:  Start apixaban 5mg  PO BID Stop heparin drip at the same time of apixaban administration Educated patient on apixaban  +0, PharmD PGY1 Ambulatory Care Pharmacy Resident 08/25/2019,2:21 PM

## 2019-08-25 NOTE — ED Triage Notes (Signed)
Pt here from home with c/o weakness and dizziness times one week , along with some vomiting ,

## 2019-08-25 NOTE — ED Provider Notes (Addendum)
[facility] EMERGENCY DEPARTMENT Provider Note   CSN: [id number] Arrival date & time: 08/25/19  4081     History Chief Complaint  Patient presents with  . Weakness  . Dizziness    Lynn Hayden is a 79 y.o. female.  HPI    This patient is a 79 year old female, she has a history of IBS, history of hypertrophic cardiomegaly, she is currently visiting with cardiology service, she was seen for chest pain in January 2021.  She had had a negative exercise treadmill test in 2013, she had a Lexiscan Myoview in 2016 but also was found to have septal hypertrophy on echocardiogram.  The patient reports to me that she is having intermittent feelings of palpitations.  This seems to get sometimes worse with exertion  She reports that this morning she had ongoing symptoms that were associated with lightheadedness, nausea, she has vomited, she feels like she is get a pass out when she stands or walks.  It usually gets better but today it did not.  She has not been found to have an obvious arrhythmia in her prior work-ups.  She is not anticoagulated  She is not febrile having chills diarrhea coughing or shortness of breath.  She has no swelling of the legs.  Past Medical History:  Diagnosis Date  . Arthritis   . Chronic diarrhea   . Hemorrhoid   . Hypertrophic cardiomegaly   . IBS (irritable bowel syndrome)   . Insomnia   . Miscarriage   . Osteoporosis   . Thyromegaly   . Vitamin D deficiency     Patient Active Problem List   Diagnosis Date Noted  . Paroxysmal atrial fibrillation (HCC) 08/25/2019  . Infection of urinary tract 01/06/2015  . Thyromegaly 07/01/2014  . Insomnia 07/01/2014  . Abnormal EKG 05/12/2014  . Murmur 03/22/2012    Past Surgical History:  Procedure Laterality Date  . CATARACT EXTRACTION    . DILATION AND CURETTAGE OF UTERUS    . EAR CYST EXCISION  11/09/2011   Procedure: CYST REMOVAL;  Surgeon: Tami Ribas, MD;  Location: [facility];  Service: Orthopedics;  Laterality: Right;  right index excision cyst/foreign body and debridement DIP joint  . GANGLION CYST EXCISION     rt wrist  . GANGLION CYST EXCISION    . TONSILLECTOMY    . WISDOM TOOTH EXTRACTION       OB History   No obstetric history on file.     Family History  Problem Relation Age of Onset  . Vascular Disease Mother   . Diabetes Father   . Dementia Father   . Lymphoma Sister     Social History   Tobacco Use  . Smoking status: Former Smoker    Quit date: 11/06/1988    Years since quitting: 30.8  . Smokeless tobacco: Never Used  Substance Use Topics  . Alcohol use: Yes    Comment: rare  . Drug use: No    Home Medications Prior to Admission medications   Medication Sig Start Date End Date Taking? Authorizing Provider  Calcium Carb-Cholecalciferol [phone] MG-UNIT TABS Take 2 tablets by mouth daily.    Yes [provider]  diphenoxylate-atropine (LOMOTIL) 2.5-0.025 MG per tablet Take 1 tablet by mouth as needed for diarrhea or loose stools. 12/14/14  Yes Weaver, Layne C, NP  temazepam (RESTORIL) 30 MG capsule Take 1 capsule (30 mg total) by mouth at bedtime as needed. Patient taking differently: Take 15 mg  by mouth at bedtime.  12/14/14  Yes Nicky Pugh C, NP    Allergies    Amoxicillin  Review of Systems   Review of Systems  All other systems reviewed and are negative.   Physical Exam Updated Vital Signs BP (!) 143/68   Pulse 94   Temp 98.1 F (36.7 C) (Oral)   Resp 18   Ht 1.626 m (5\' 4" )   Wt 49.9 kg   SpO2 97%   BMI 18.88 kg/m   Physical Exam Vitals and nursing note reviewed.  Constitutional:      General: She is not in acute distress.    Appearance: She is well-developed.  HENT:     Head: Normocephalic and atraumatic.     Mouth/Throat:     Pharynx: No oropharyngeal exudate.  Eyes:     General: No scleral icterus.       Right eye: No discharge.        Left eye: No discharge.      Conjunctiva/sclera: Conjunctivae normal.     Pupils: Pupils are equal, round, and reactive to light.  Neck:     Thyroid: No thyromegaly.     Vascular: No JVD.  Cardiovascular:     Rate and Rhythm: Tachycardia present. Rhythm irregular.     Heart sounds: Normal heart sounds. No murmur. No friction rub. No gallop.   Pulmonary:     Effort: Pulmonary effort is normal. No respiratory distress.     Breath sounds: Normal breath sounds. No wheezing or rales.  Abdominal:     General: Bowel sounds are normal. There is no distension.     Palpations: Abdomen is soft. There is no mass.     Tenderness: There is no abdominal tenderness.  Musculoskeletal:        General: No tenderness. Normal range of motion.     Cervical back: Normal range of motion and neck supple.  Lymphadenopathy:     Cervical: No cervical adenopathy.  Skin:    General: Skin is warm and dry.     Findings: No erythema or rash.  Neurological:     Mental Status: She is alert.     Coordination: Coordination normal.  Psychiatric:        Behavior: Behavior normal.     ED Results / Procedures / Treatments   Labs (all labs ordered are listed, but only abnormal results are displayed) Labs Reviewed  BASIC METABOLIC PANEL - Abnormal; Notable for the following components:      Result Value   CO2 21 (*)    Glucose, Bld 123 (*)    All other components within normal limits  CBC - Abnormal; Notable for the following components:   RBC 5.23 (*)    All other components within normal limits  TROPONIN I (HIGH SENSITIVITY) - Abnormal; Notable for the following components:   Troponin I (High Sensitivity) 43 (*)    All other components within normal limits  TROPONIN I (HIGH SENSITIVITY)  TROPONIN I (HIGH SENSITIVITY)    EKG EKG Interpretation  Date/Time:  Monday August 25 2019 09:03:46 EDT Ventricular Rate:  155 PR Interval:    QRS Duration: 70 QT Interval:  272 QTC Calculation: 437 R Axis:   58 Text Interpretation: Atrial  fibrillation with rapid ventricular response Nonspecific T wave abnormality No old tracing to compare Confirmed by Noemi Chapel [phone]) on 08/25/2019 9:20:24 AM   Radiology DG Chest 2 View  Result Date: 08/25/2019 CLINICAL DATA:  Weakness and dizziness with vomiting  EXAM: CHEST - 2 VIEW COMPARISON:  None. FINDINGS: There is a probable nipple shadow on the right. No edema or airspace opacity evident. Heart size and pulmonary vascularity are normal. No adenopathy. There is aortic atherosclerosis. There is anterior wedging of a lower thoracic vertebral body. IMPRESSION: Probable nipple shadow on the right; repeat study with nipple markers could confirm. No edema or airspace opacity. Cardiac silhouette within normal limits. Aortic Atherosclerosis (ICD10-I70.0). Electronically Signed   By: Bretta Bang III M.D.   On: 08/25/2019 09:25    Procedures .Critical Care Performed by: Eber Hong, MD Authorized by: Eber Hong, MD   Critical care provider statement:    Critical care time (minutes):  35   Critical care time was exclusive of:  Separately billable procedures and treating other patients and teaching time   Critical care was necessary to treat or prevent imminent or life-threatening deterioration of the following conditions:  Cardiac failure (afib with rvr)   Critical care was time spent personally by me on the following activities:  Blood draw for specimens, development of treatment plan with patient or surrogate, discussions with consultants, evaluation of patient's response to treatment, examination of patient, obtaining history from patient or surrogate, ordering and performing treatments and interventions, ordering and review of laboratory studies, ordering and review of radiographic studies, pulse oximetry, re-evaluation of patient's condition and review of old charts Comments:         (including critical care time)  Medications Ordered in ED Medications  apixaban (ELIQUIS)  tablet 5 mg (5 mg Oral Given 08/25/19 1450)  diltiazem (CARDIZEM CD) 24 hr capsule 120 mg (has no administration in time range)  sodium chloride flush (NS) 0.9 % injection 3 mL (3 mLs Intravenous Given 08/25/19 0946)  diltiazem (CARDIZEM) 1 mg/mL load via infusion 20 mg (20 mg Intravenous Bolus from Bag 08/25/19 1032)  heparin bolus via infusion 2,500 Units (2,500 Units Intravenous Bolus from Bag 08/25/19 1029)    ED Course  I have reviewed the triage vital signs and the nursing notes.  Pertinent labs & imaging results that were available during my care of the patient were reviewed by me and considered in my medical decision making (see chart for details).    MDM Rules/Calculators/A&P                      X-ray reveals no acute findings.  The patient's exam is consistent with atrial fibrillation with a rapid ventricular rate.  She otherwise appears clinically well.  She will need rate control, she may need anticoagulation given her intermittent A. fib history and the need for probable cardioversion eventually.  I will discuss the patient's care with cardiology.   Cardizem, drip with bolus  Heparin  Labs  X-ray EKG  Thankfully the lab work is rather unremarkable, she is not anemic, has no electrolyte dysfunction and normal renal function, troponin is 17 and the chest x-ray shows no acute findings.  I discussed the care with the cardiology provider at 11:08 AM, they will come see the patient for admission  CHA2DS2-VASc Score = 33 The patient's score is based upon: CHF History: No HTN History: No Age : 35 + Diabetes History: No Stroke History: No Vascular Disease History: No Gender: Female  ASSESSMENT AND PLAN: Paroxysmal Atrial Fibrillation (ICD10:  I48.0) The patient's CHA2DS2-VASc score is 3, indicating a 3.2% annual risk of stroke.    Secondary Hypercoagulable State (ICD10:  D68.69) The patient is at significant risk for stroke/thromboembolism  based upon her CHA2DS2-VASc  Score of 3.  Start heparin.   This patient was seen by cardiology, Dr. Antoine Poche has made recommendations that the patient can be discharged on Eliquis 5 mg twice daily as well as diltiazem 120 mg daily, he will follow up in the clinic agraffe the patient is now rate controlled and appears more stable for discharge after ambulation  Pt is now back in NSR - some ectopy - CT scan needed - may be having ataxi afrom embolic -   Signed out to Dr. Rubin Payor to f/u results and dispositioning accordingly.  Signed,  Lynn Roller, MD    08/25/2019 3:38 PM     Final Clinical Impression(s) / ED Diagnoses Final diagnoses:  Atrial fibrillation with rapid ventricular response (HCC)     Eber Hong, MD 08/25/19 1112    Eber Hong, MD 08/25/19 1538

## 2019-08-25 NOTE — ED Notes (Signed)
Patient transported to CT 

## 2019-08-25 NOTE — Discharge Instructions (Addendum)
Take the Zofran as needed for the nausea.  Try and keep yourself hydrated.  Follow-up with your doctor and with cardiology.  Information on my medicine - ELIQUIS (apixaban)  This medication education was reviewed with me or my healthcare representative as part of my discharge preparation.   Why was Eliquis prescribed for you? Eliquis was prescribed for you to reduce the risk of a blood clot forming that can cause a stroke if you have a medical condition called atrial fibrillation (a type of irregular heartbeat).  What do You need to know about Eliquis ? Take your Eliquis TWICE DAILY - one tablet in the morning and one tablet in the evening with or without food. If you have difficulty swallowing the tablet whole please discuss with your pharmacist how to take the medication safely.  Take Eliquis exactly as prescribed by your doctor and DO NOT stop taking Eliquis without talking to the doctor who prescribed the medication.  Stopping may increase your risk of developing a stroke.  Refill your prescription before you run out.  After discharge, you should have regular check-up appointments with your healthcare provider that is prescribing your Eliquis.  In the future your dose may need to be changed if your kidney function or weight changes by a significant amount or as you get older.  What do you do if you miss a dose? If you miss a dose, take it as soon as you remember on the same day and resume taking twice daily.  Do not take more than one dose of ELIQUIS at the same time to make up a missed dose.  Important Safety Information A possible side effect of Eliquis is bleeding. You should call your healthcare provider right away if you experience any of the following: Bleeding from an injury or your nose that does not stop. Unusual colored urine (red or dark brown) or unusual colored stools (red or black). Unusual bruising for unknown reasons. A serious fall or if you hit your head (even  if there is no bleeding).  Some medicines may interact with Eliquis and might increase your risk of bleeding or clotting while on Eliquis. To help avoid this, consult your healthcare provider or pharmacist prior to using any new prescription or non-prescription medications, including herbals, vitamins, non-steroidal anti-inflammatory drugs (NSAIDs) and supplements.  This website has more information on Eliquis (apixaban): http://www.eliquis.com/eliquis/home

## 2019-08-25 NOTE — Progress Notes (Signed)
ANTICOAGULATION CONSULT NOTE - Initial Consult  Pharmacy Consult for heparin Indication: atrial fibrillation  Allergies  Allergen Reactions  . Amoxicillin Rash    Patient Measurements: Height: 5\' 4"  (162.6 cm) Weight: 110 lb (49.9 kg) IBW/kg (Calculated) : 54.7 Heparin Dosing Weight: TBW  Vital Signs: Temp: 98.1 F (36.7 C) (03/22 0856) Temp Source: Oral (03/22 0856) BP: 179/107 (03/22 0856) Pulse Rate: 82 (03/22 0856)  Labs: No results for input(s): HGB, HCT, PLT, APTT, LABPROT, INR, HEPARINUNFRC, HEPRLOWMOCWT, CREATININE, CKTOTAL, CKMB, TROPONINIHS in the last 72 hours.  CrCl cannot be calculated (Patient's most recent lab result is older than the maximum 21 days allowed.).   Medical History: Past Medical History:  Diagnosis Date  . Arthritis   . Chronic diarrhea   . Hemorrhoid   . Hypertrophic cardiomegaly   . IBS (irritable bowel syndrome)   . Insomnia   . Miscarriage   . Osteoporosis   . Thyromegaly   . Vitamin D deficiency    Assessment: 48 YOF presenting with dizziness/weakness, in afib not on anticoagulation PTA.    Goal of Therapy:  Heparin level 0.3-0.7 units/ml Monitor platelets by anticoagulation protocol: Yes   Plan:  Heparin 2500 units IV x 1, and gtt at 750 units/hr F/u 8 hour heparin level  70, PharmD Clinical Pharmacist ED Pharmacist Phone # [phone] 08/25/2019 9:56 AM

## 2019-08-26 ENCOUNTER — Telehealth: Payer: Self-pay | Admitting: Cardiology

## 2019-08-26 NOTE — Telephone Encounter (Signed)
Veronica from [place] is calling in regards to [place] reaching out to their office stating she was unable to get in contact with our office in regards to her Eliquis this morning. I advised Wilfred Lacy had just called our office at 1:11 PM in regards to this and a nurse will be calling her back.

## 2019-08-26 NOTE — Telephone Encounter (Signed)
Follow up  Pt calling back regarding meds request. Lynn Hayden said her heart feels beating so fast, Lynn Hayden don't have HR reader so Lynn Hayden don't know her exact HR, Lynn Hayden said Lynn Hayden's not feeling good and need the meds Dr. Antoine Poche prescribed yesterday. Lynn Hayden also requested a nurse to call her back  Please call

## 2019-08-26 NOTE — Telephone Encounter (Signed)
*  STAT* If patient is at the pharmacy, call can be transferred to refill team.   1. Which medications need to be refilled? (please list name of each medication and dose if known)  Diltiazem Eliquis  2. Which pharmacy/location (including street and city if local pharmacy) is medication to be sent to? [id number] - [place], [place] - [address]  3. Do they need a 30 day or 90 day supply? 30 day supply  Patient states medications prescribed by Dr. Antoine Poche while she was in the hospital were never called in to her pharmacy. Please assist.

## 2019-08-27 ENCOUNTER — Other Ambulatory Visit: Payer: Self-pay | Admitting: *Deleted

## 2019-08-27 ENCOUNTER — Telehealth (HOSPITAL_COMMUNITY): Payer: Self-pay

## 2019-08-27 ENCOUNTER — Telehealth: Payer: Self-pay

## 2019-08-27 DIAGNOSIS — I422 Other hypertrophic cardiomyopathy: Secondary | ICD-10-CM

## 2019-08-27 DIAGNOSIS — R072 Precordial pain: Secondary | ICD-10-CM

## 2019-08-27 NOTE — Telephone Encounter (Signed)
Pt presented to office today stating discharge medication was never ordered and sent to pharmacy. Per notes recorded by Dr. Hyacinth Meeker pt was instructed by Dr. Antoine Poche to start Diltiazem 120 mg daily and Eliquis 5 mg BID.   Will forward to Dr. Antoine Poche for approval to send in prescription.

## 2019-08-27 NOTE — Telephone Encounter (Signed)
Encounter complete. 

## 2019-08-28 ENCOUNTER — Other Ambulatory Visit: Payer: Self-pay

## 2019-08-28 MED ORDER — DILTIAZEM HCL ER COATED BEADS 120 MG PO CP24: 120.0000 mg | ORAL_CAPSULE | Freq: Every day | ORAL | 3 refills | Status: DC

## 2019-08-28 MED ORDER — APIXABAN 5 MG PO TABS: 5.0000 mg | ORAL_TABLET | Freq: Two times a day (BID) | ORAL | 3 refills | Status: DC

## 2019-08-28 NOTE — Progress Notes (Signed)
Medications ordered per Dr. Antoine Poche.

## 2019-08-29 ENCOUNTER — Other Ambulatory Visit: Payer: Self-pay

## 2019-08-29 ENCOUNTER — Ambulatory Visit (HOSPITAL_COMMUNITY)
Admission: RE | Admit: 2019-08-29 | Discharge: 2019-08-29 | Disposition: A | Discharge status: Home / Self Care | Payer: Medicare HMO | Source: Ambulatory Visit | Attending: Internal Medicine | Admitting: Internal Medicine

## 2019-08-29 DIAGNOSIS — I422 Other hypertrophic cardiomyopathy: Secondary | ICD-10-CM

## 2019-08-29 DIAGNOSIS — R072 Precordial pain: Secondary | ICD-10-CM | POA: Diagnosis not present

## 2019-08-29 LAB — MYOCARDIAL PERFUSION IMAGING
Peak HR: 176 {beats}/min
Rest HR: 151 {beats}/min
SDS: 0
SRS: 4
SSS: 4
TID: 0.96

## 2019-08-29 MED ORDER — REGADENOSON 0.4 MG/5ML IV SOLN
0.4000 mg | Freq: Once | INTRAVENOUS | Status: AC
  Administered 2019-08-29: 0.4 mg via INTRAVENOUS

## 2019-08-29 MED ORDER — TECHNETIUM TC 99M TETROFOSMIN IV KIT
10.2000 | PACK | Freq: Once | INTRAVENOUS | Status: AC | PRN
  Administered 2019-08-29: 10.2 via INTRAVENOUS
  Filled 2019-08-29: qty 11

## 2019-08-29 MED ORDER — TECHNETIUM TC 99M TETROFOSMIN IV KIT
31.7000 | PACK | Freq: Once | INTRAVENOUS | Status: AC | PRN
  Administered 2019-08-29: 31.7 via INTRAVENOUS
  Filled 2019-08-29: qty 32

## 2019-08-29 NOTE — Telephone Encounter (Signed)
This was completed I believe

## 2019-09-02 ENCOUNTER — Ambulatory Visit: Payer: Medicare HMO | Admitting: Cardiology

## 2019-09-02 ENCOUNTER — Other Ambulatory Visit: Payer: Self-pay

## 2019-09-02 ENCOUNTER — Encounter: Payer: Self-pay | Admitting: Cardiology

## 2019-09-02 DIAGNOSIS — Z7901 Long term (current) use of anticoagulants: Secondary | ICD-10-CM

## 2019-09-02 DIAGNOSIS — I480 Paroxysmal atrial fibrillation: Secondary | ICD-10-CM

## 2019-09-02 MED ORDER — DILTIAZEM HCL ER COATED BEADS 120 MG PO CP24: 120.0000 mg | ORAL_CAPSULE | ORAL | 3 refills | Status: DC

## 2019-09-02 NOTE — Assessment & Plan Note (Signed)
Presented to ED 08/25/2019- converted with rate control.- Discharged on Diltiazem 120 mg daily.

## 2019-09-02 NOTE — Progress Notes (Signed)
Cardiology Office Note:    Date:  09/02/2019   ID:  Lynn Hayden, Lynn Hayden [birth year], MRN [id number]  PCP:  Orpah Melter, MD  Cardiologist:  Dr Percival Spanish Electrophysiologist:  None  Referring MD: Orpah Melter, MD   No chief complaint on file. F/U from ED visit 08/25/2019  History of Present Illness:    Lynn Hayden is a 79 y.o. female with a hx of prior evaluation by cardiology for chest pain in 2013 with a treadmill, and again in January 2016 with a low risk nuclear stress test.  She presented the emergency room 3/22 2021 with tachycardia and was found to be in atrial fibrillation with RVR.  She was given medications for rate control and converted spontaneously.  Echocardiogram showed preserved LV function with moderate left atrial dilatation.  Patient was placed on diltiazem 120 mg a day and Eliquis 5 mg twice daily.  She was discharged from the emergency room for outpatient follow-up.  Nuclear stress test done 08/31/2019 was low risk.  She is seen in the office today for follow-up.  Symptomatically she says she has felt well the last few days.  Past Medical History:  Diagnosis Date  . Arthritis   . Chronic diarrhea   . Hemorrhoid   . Hypertrophic cardiomegaly   . IBS (irritable bowel syndrome)   . Insomnia   . Miscarriage   . Osteoporosis   . Thyromegaly   . Vitamin D deficiency     Past Surgical History:  Procedure Laterality Date  . CATARACT EXTRACTION    . DILATION AND CURETTAGE OF UTERUS    . EAR CYST EXCISION  11/09/2011   Procedure: CYST REMOVAL;  Surgeon: Tennis Must, MD;  Location: [facility];  Service: Orthopedics;  Laterality: Right;  right index excision cyst/foreign body and debridement DIP joint  . GANGLION CYST EXCISION     rt wrist  . GANGLION CYST EXCISION    . TONSILLECTOMY    . WISDOM TOOTH EXTRACTION      Current Medications: Current Meds  Medication Sig  . apixaban (ELIQUIS) 5 MG TABS tablet Take 1 tablet (5 mg total) by mouth 2  (two) times daily.  . Calcium Carb-Cholecalciferol [id number] MG-UNIT TABS Take 2 tablets by mouth daily.   Marland Kitchen diltiazem (CARDIZEM CD) 120 MG 24 hr capsule Take 1 capsule (120 mg total) by mouth daily.  . diphenoxylate-atropine (LOMOTIL) 2.5-0.025 MG per tablet Take 1 tablet by mouth as needed for diarrhea or loose stools.  . ondansetron (ZOFRAN-ODT) 4 MG disintegrating tablet Take 1 tablet (4 mg total) by mouth every 8 (eight) hours as needed for nausea or vomiting.  . temazepam (RESTORIL) 15 MG capsule Take 15 mg by mouth at bedtime as needed for sleep.  . [DISCONTINUED] temazepam (RESTORIL) 30 MG capsule Take 1 capsule (30 mg total) by mouth at bedtime as needed. (Patient taking differently: Take 15 mg by mouth at bedtime. )     Allergies:   Amoxicillin   Social History   Socioeconomic History  . Marital status: Married    Spouse name: Not on file  . Number of children: 3  . Years of education: Not on file  . Highest education level: Not on file  Occupational History    Comment: retired  Tobacco Use  . Smoking status: Former Smoker    Quit date: 11/06/1988    Years since quitting: 30.8  . Smokeless tobacco: Never Used  Substance and Sexual Activity  . Alcohol use:  Yes    Comment: rare  . Drug use: No  . Sexual activity: Not Currently  Other Topics Concern  . Not on file  Social History Narrative   Lives with husband.  Has 2 biological children & 1 adopted child.  Takes care of disables son.  Still works and does all the housework   Social Determinants of Corporate investment banker Strain:   . Difficulty of Paying Living Expenses:   Food Insecurity:   . Worried About Programme researcher, broadcasting/film/video in the Last Year:   . Barista in the Last Year:   Transportation Needs:   . Freight forwarder (Medical):   Marland Kitchen Lack of Transportation (Non-Medical):   Physical Activity:   . Days of Exercise per Week:   . Minutes of Exercise per Session:   Stress:   . Feeling of Stress :    Social Connections:   . Frequency of Communication with Friends and Family:   . Frequency of Social Gatherings with Friends and Family:   . Attends Religious Services:   . Active Member of Clubs or Organizations:   . Attends Banker Meetings:   Marland Kitchen Marital Status:      Family History: The patient's family history includes Dementia in her father; Diabetes in her father; Lymphoma in her sister; Vascular Disease in her mother.  ROS:   Please see the history of present illness.     All other systems reviewed and are negative.  EKGs/Labs/Other Studies Reviewed:    The following studies were reviewed today: Echo 08/25/2019 Myoview 08/31/2019  EKG:  EKG is ordered today.  The ekg ordered today demonstrates NSR, one PVC, HR 80.  Recent Labs: 08/25/2019: BUN 15; Creatinine, Ser 0.67; Hemoglobin 14.5; Platelets 177; Potassium 4.0; Sodium 140  Recent Lipid Panel No results found for: CHOL, TRIG, HDL, CHOLHDL, VLDL, LDLCALC, LDLDIRECT  Physical Exam:    VS:  BP 119/63   Pulse 88   Temp (!) 96.1 F (35.6 C)   Ht 5\' 4"  (1.626 m)   Wt 108 lb 3.2 oz (49.1 kg)   SpO2 97%   BMI 18.57 kg/m     Wt Readings from Last 3 Encounters:  09/02/19 108 lb 3.2 oz (49.1 kg)  08/29/19 108 lb (49 kg)  08/25/19 110 lb (49.9 kg)     GEN: Thin Caucasian female, well developed in no acute distress HEENT: Normal NECK: No JVD; No carotid bruits LYMPHATICS: No lymphadenopathy CARDIAC: RRR, no murmurs, rubs, gallops RESPIRATORY:  Overall decreased breath sounds bu clear to auscultation without rales, wheezing or rhonchi  ABDOMEN: Soft, non-tender, non-distended MUSCULOSKELETAL:  No edema; No deformity  SKIN: Warm and dry NEUROLOGIC:  Alert and oriented x 3 PSYCHIATRIC:  Normal affect   ASSESSMENT:    Paroxysmal atrial fibrillation (HCC) Presented to ED 08/25/2019- converted with rate control.- Discharged on Diltiazem 120 mg daily.  Chronic anticoagulation CHADS VASC= 3- Eliquis 5 mg  BID added march 2021  PLAN:    Same Rx.  She asked if she should be on the lower dose of Eliquis because her weight was < 50 kg.  She will be 79 y/o next month, renal function is normal.  I suggested that she continue the 5 mg BID for now especially since she was just cardioverted.  F/U with Dr 76 in 3 months.    Medication Adjustments/Labs and Tests Ordered: Current medicines are reviewed at length with the patient today.  Concerns regarding medicines  are outlined above.  No orders of the defined types were placed in this encounter.  No orders of the defined types were placed in this encounter.   There are no Patient Instructions on file for this visit.   Signed, Corine Shelter, PA-C  09/02/2019 11:11 AM    [facility]

## 2019-09-02 NOTE — Patient Instructions (Signed)
Medication Instructions:  TAKE YOUR DILTIAZEM IN THE AM *If you need a refill on your cardiac medications before your next appointment, please call your pharmacy*  Follow-Up: Your next appointment:  3 month(s)  In Person with Rollene Rotunda, MD.  At [facility], you and your health needs are our priority.  As part of our continuing mission to provide you with exceptional heart care, we have created designated Provider Care Teams.  These Care Teams include your primary Cardiologist (physician) and Advanced Practice Providers (APPs -  Physician Assistants and Nurse Practitioners) who all work together to provide you with the care you need, when you need it.  We recommend signing up for the patient portal called "MyChart".  Sign up information is provided on this After Visit Summary.  MyChart is used to connect with patients for Virtual Visits (Telemedicine).  Patients are able to view lab/test results, encounter notes, upcoming appointments, etc.  Non-urgent messages can be sent to your provider as well.   To learn more about what you can do with MyChart, go to [email or web address].

## 2019-09-02 NOTE — Assessment & Plan Note (Signed)
CHADS VASC= 3- Eliquis 5 mg BID added march 2021

## 2019-09-09 ENCOUNTER — Inpatient Hospital Stay (HOSPITAL_COMMUNITY): Admission: RE | Admit: 2019-09-09 | Payer: Medicare HMO | Source: Ambulatory Visit

## 2019-09-25 DIAGNOSIS — R0789 Other chest pain: Secondary | ICD-10-CM | POA: Diagnosis not present

## 2019-09-29 ENCOUNTER — Telehealth: Payer: Self-pay | Admitting: Cardiology

## 2019-09-29 NOTE — Telephone Encounter (Signed)
Pt c/o Shortness Of Breath: STAT if SOB developed within the last 24 hours or pt is noticeably SOB on the phone  1. Are you currently SOB (can you hear that pt is SOB on the phone)? States she is currently having it.   2. How long have you been experiencing SOB? For about a week  3. Are you SOB when sitting or when up moving around? Moving around  4. Are you currently experiencing any other symptoms? Fast HR with the SOB.   I did schedule her an appt with Joni Reining for tomorrow.

## 2019-09-29 NOTE — Telephone Encounter (Signed)
Spoke with patient and she is having shortness of breath when she is up moving around, has been having off and on for about a week. Patient is comfortable waiting for visit tomorrow. Advised if worse go to ED

## 2019-09-30 ENCOUNTER — Encounter: Payer: Self-pay | Admitting: Adult Health

## 2019-09-30 ENCOUNTER — Ambulatory Visit: Payer: Medicare HMO | Admitting: Adult Health

## 2019-09-30 ENCOUNTER — Inpatient Hospital Stay (HOSPITAL_COMMUNITY)
Admission: EM | Admit: 2019-09-30 | Discharge: 2019-10-07 | DRG: 308 | Disposition: A | Discharge status: Home / Self Care | Payer: Medicare HMO | Attending: Cardiology | Admitting: Cardiology

## 2019-09-30 ENCOUNTER — Emergency Department (HOSPITAL_COMMUNITY): Payer: Medicare HMO

## 2019-09-30 ENCOUNTER — Other Ambulatory Visit: Payer: Self-pay

## 2019-09-30 VITALS — BP 150/82 | HR 161 | Ht 64.0 in | Wt 116.2 lb

## 2019-09-30 DIAGNOSIS — I351 Nonrheumatic aortic (valve) insufficiency: Secondary | ICD-10-CM | POA: Diagnosis not present

## 2019-09-30 DIAGNOSIS — R0600 Dyspnea, unspecified: Secondary | ICD-10-CM | POA: Diagnosis present

## 2019-09-30 DIAGNOSIS — E876 Hypokalemia: Secondary | ICD-10-CM | POA: Diagnosis not present

## 2019-09-30 DIAGNOSIS — J90 Pleural effusion, not elsewhere classified: Secondary | ICD-10-CM | POA: Diagnosis not present

## 2019-09-30 DIAGNOSIS — Z209 Contact with and (suspected) exposure to unspecified communicable disease: Secondary | ICD-10-CM | POA: Diagnosis not present

## 2019-09-30 DIAGNOSIS — I4891 Unspecified atrial fibrillation: Secondary | ICD-10-CM | POA: Diagnosis not present

## 2019-09-30 DIAGNOSIS — Z7901 Long term (current) use of anticoagulants: Secondary | ICD-10-CM | POA: Diagnosis not present

## 2019-09-30 DIAGNOSIS — M810 Age-related osteoporosis without current pathological fracture: Secondary | ICD-10-CM | POA: Diagnosis present

## 2019-09-30 DIAGNOSIS — Z79899 Other long term (current) drug therapy: Secondary | ICD-10-CM

## 2019-09-30 DIAGNOSIS — Z880 Allergy status to penicillin: Secondary | ICD-10-CM

## 2019-09-30 DIAGNOSIS — I4892 Unspecified atrial flutter: Secondary | ICD-10-CM | POA: Diagnosis not present

## 2019-09-30 DIAGNOSIS — K580 Irritable bowel syndrome with diarrhea: Secondary | ICD-10-CM | POA: Diagnosis present

## 2019-09-30 DIAGNOSIS — G4700 Insomnia, unspecified: Secondary | ICD-10-CM | POA: Diagnosis present

## 2019-09-30 DIAGNOSIS — Z20822 Contact with and (suspected) exposure to covid-19: Secondary | ICD-10-CM | POA: Diagnosis present

## 2019-09-30 DIAGNOSIS — I5021 Acute systolic (congestive) heart failure: Secondary | ICD-10-CM | POA: Diagnosis not present

## 2019-09-30 DIAGNOSIS — J9811 Atelectasis: Secondary | ICD-10-CM | POA: Diagnosis not present

## 2019-09-30 DIAGNOSIS — I272 Pulmonary hypertension, unspecified: Secondary | ICD-10-CM

## 2019-09-30 DIAGNOSIS — F419 Anxiety disorder, unspecified: Secondary | ICD-10-CM | POA: Diagnosis present

## 2019-09-30 DIAGNOSIS — Z87891 Personal history of nicotine dependence: Secondary | ICD-10-CM

## 2019-09-30 DIAGNOSIS — I088 Other rheumatic multiple valve diseases: Secondary | ICD-10-CM | POA: Diagnosis not present

## 2019-09-30 DIAGNOSIS — I5033 Acute on chronic diastolic (congestive) heart failure: Secondary | ICD-10-CM | POA: Diagnosis present

## 2019-09-30 DIAGNOSIS — E559 Vitamin D deficiency, unspecified: Secondary | ICD-10-CM | POA: Diagnosis present

## 2019-09-30 DIAGNOSIS — R0602 Shortness of breath: Secondary | ICD-10-CM | POA: Diagnosis not present

## 2019-09-30 DIAGNOSIS — I4819 Other persistent atrial fibrillation: Principal | ICD-10-CM | POA: Diagnosis present

## 2019-09-30 DIAGNOSIS — R0902 Hypoxemia: Secondary | ICD-10-CM | POA: Diagnosis not present

## 2019-09-30 DIAGNOSIS — I422 Other hypertrophic cardiomyopathy: Secondary | ICD-10-CM | POA: Diagnosis present

## 2019-09-30 DIAGNOSIS — R7303 Prediabetes: Secondary | ICD-10-CM | POA: Diagnosis not present

## 2019-09-30 DIAGNOSIS — I361 Nonrheumatic tricuspid (valve) insufficiency: Secondary | ICD-10-CM | POA: Diagnosis not present

## 2019-09-30 DIAGNOSIS — I43 Cardiomyopathy in diseases classified elsewhere: Secondary | ICD-10-CM

## 2019-09-30 DIAGNOSIS — E0590 Thyrotoxicosis, unspecified without thyrotoxic crisis or storm: Secondary | ICD-10-CM | POA: Diagnosis not present

## 2019-09-30 DIAGNOSIS — I480 Paroxysmal atrial fibrillation: Secondary | ICD-10-CM | POA: Diagnosis not present

## 2019-09-30 DIAGNOSIS — I499 Cardiac arrhythmia, unspecified: Secondary | ICD-10-CM | POA: Diagnosis not present

## 2019-09-30 DIAGNOSIS — M1990 Unspecified osteoarthritis, unspecified site: Secondary | ICD-10-CM | POA: Diagnosis present

## 2019-09-30 DIAGNOSIS — R69 Illness, unspecified: Secondary | ICD-10-CM | POA: Diagnosis not present

## 2019-09-30 DIAGNOSIS — R000 Tachycardia, unspecified: Secondary | ICD-10-CM | POA: Diagnosis not present

## 2019-09-30 DIAGNOSIS — F5101 Primary insomnia: Secondary | ICD-10-CM | POA: Diagnosis not present

## 2019-09-30 DIAGNOSIS — I5032 Chronic diastolic (congestive) heart failure: Secondary | ICD-10-CM | POA: Diagnosis not present

## 2019-09-30 DIAGNOSIS — I340 Nonrheumatic mitral (valve) insufficiency: Secondary | ICD-10-CM | POA: Diagnosis not present

## 2019-09-30 DIAGNOSIS — I5031 Acute diastolic (congestive) heart failure: Secondary | ICD-10-CM | POA: Diagnosis not present

## 2019-09-30 HISTORY — DX: Pulmonary hypertension, unspecified: I27.20

## 2019-09-30 HISTORY — DX: Other persistent atrial fibrillation: I48.19

## 2019-09-30 HISTORY — DX: Thyrotoxicosis, unspecified without thyrotoxic crisis or storm: E05.90

## 2019-09-30 HISTORY — DX: Chronic diastolic (congestive) heart failure: I50.32

## 2019-09-30 LAB — BASIC METABOLIC PANEL
Anion gap: 9 (ref 5–15)
BUN: 17 mg/dL (ref 8–23)
CO2: 21 mmol/L — ABNORMAL LOW (ref 22–32)
Calcium: 9.1 mg/dL (ref 8.9–10.3)
Chloride: 109 mmol/L (ref 98–111)
Creatinine, Ser: 0.79 mg/dL (ref 0.44–1.00)
GFR calc Af Amer: >60 mL/min (ref >60)
GFR calc non Af Amer: >60 mL/min (ref >60)
Glucose, Bld: 109 mg/dL — ABNORMAL HIGH (ref 70–99)
Potassium: 3.5 mmol/L (ref 3.5–5.1)
Sodium: 139 mmol/L (ref 135–145)

## 2019-09-30 LAB — TROPONIN I (HIGH SENSITIVITY)
Troponin I (High Sensitivity): 12 ng/L (ref <18)
Troponin I (High Sensitivity): 13 ng/L (ref <18)

## 2019-09-30 LAB — CBC
HCT: 37.9 % (ref 36.0–46.0)
Hemoglobin: 12.2 g/dL (ref 12.0–15.0)
MCH: 27.6 pg (ref 26.0–34.0)
MCHC: 32.2 g/dL (ref 30.0–36.0)
MCV: 85.7 fL (ref 80.0–100.0)
Platelets: 198 10*3/uL (ref 150–400)
RBC: 4.42 MIL/uL (ref 3.87–5.11)
RDW: 13.8 % (ref 11.5–15.5)
WBC: 5.5 10*3/uL (ref 4.0–10.5)
nRBC: 0 % (ref 0.0–0.2)

## 2019-09-30 LAB — HEPATIC FUNCTION PANEL
ALT: 19 U/L (ref 0–44)
AST: 23 U/L (ref 15–41)
Albumin: 3.1 g/dL — ABNORMAL LOW (ref 3.5–5.0)
Alkaline Phosphatase: 89 U/L (ref 38–126)
Bilirubin, Direct: 0.4 mg/dL — ABNORMAL HIGH (ref 0.0–0.2)
Indirect Bilirubin: 1 mg/dL — ABNORMAL HIGH (ref 0.3–0.9)
Total Bilirubin: 1.4 mg/dL — ABNORMAL HIGH (ref 0.3–1.2)
Total Protein: 5.9 g/dL — ABNORMAL LOW (ref 6.5–8.1)

## 2019-09-30 LAB — RESPIRATORY PANEL BY RT PCR (FLU A&B, COVID)
Influenza A by PCR: NEGATIVE
Influenza B by PCR: NEGATIVE
SARS Coronavirus 2 by RT PCR: NEGATIVE

## 2019-09-30 LAB — HEMOGLOBIN A1C
Hgb A1c MFr Bld: 6 % — ABNORMAL HIGH (ref 4.8–5.6)
Mean Plasma Glucose: 125.5 mg/dL

## 2019-09-30 LAB — MAGNESIUM: Magnesium: 1.9 mg/dL (ref 1.7–2.4)

## 2019-09-30 LAB — BRAIN NATRIURETIC PEPTIDE: B Natriuretic Peptide: 433.8 pg/mL — ABNORMAL HIGH (ref 0.0–100.0)

## 2019-09-30 LAB — TSH: TSH: <0.01 u[IU]/mL — ABNORMAL LOW (ref 0.350–4.500)

## 2019-09-30 MED ORDER — POTASSIUM CHLORIDE CRYS ER 20 MEQ PO TBCR
40.0000 meq | EXTENDED_RELEASE_TABLET | Freq: Once | ORAL | Status: AC
  Administered 2019-09-30: 20:00:00 40 meq via ORAL
  Filled 2019-09-30: qty 2

## 2019-09-30 MED ORDER — ACETAMINOPHEN 325 MG PO TABS: 650.0000 mg | ORAL_TABLET | ORAL | Status: DC | PRN

## 2019-09-30 MED ORDER — SODIUM CHLORIDE 0.9 % IV SOLN: 250.0000 mL | INTRAVENOUS | Status: DC | PRN

## 2019-09-30 MED ORDER — ALPRAZOLAM 0.25 MG PO TABS: 0.2500 mg | ORAL_TABLET | Freq: Two times a day (BID) | ORAL | Status: DC | PRN

## 2019-09-30 MED ORDER — APIXABAN 5 MG PO TABS
5.0000 mg | ORAL_TABLET | Freq: Two times a day (BID) | ORAL | Status: DC
  Administered 2019-09-30 – 2019-10-07 (×14): 5 mg via ORAL
  Filled 2019-09-30 (×15): qty 1

## 2019-09-30 MED ORDER — TEMAZEPAM 15 MG PO CAPS: 15.0000 mg | ORAL_CAPSULE | Freq: Every evening | ORAL | Status: DC | PRN

## 2019-09-30 MED ORDER — DILTIAZEM HCL-DEXTROSE 125-5 MG/125ML-% IV SOLN (PREMIX)
5.0000 mg/h | INTRAVENOUS | Status: DC
  Administered 2019-09-30: 18:00:00 5 mg/h via INTRAVENOUS
  Administered 2019-10-01 (×3): 15 mg/h via INTRAVENOUS
  Administered 2019-10-02: 19:00:00 5 mg/h via INTRAVENOUS
  Administered 2019-10-03: 19:00:00 7.5 mg/h via INTRAVENOUS
  Filled 2019-09-30 (×7): qty 125

## 2019-09-30 MED ORDER — NITROGLYCERIN 0.4 MG SL SUBL: 0.4000 mg | SUBLINGUAL_TABLET | SUBLINGUAL | Status: DC | PRN

## 2019-09-30 MED ORDER — DILTIAZEM LOAD VIA INFUSION
20.0000 mg | Freq: Once | INTRAVENOUS | Status: AC
  Administered 2019-09-30: 18:00:00 20 mg via INTRAVENOUS
  Filled 2019-09-30: qty 20

## 2019-09-30 MED ORDER — CALCIUM CARB-CHOLECALCIFEROL 600-1000 MG-UNIT PO TABS: 2.0000 | ORAL_TABLET | Freq: Every day | ORAL | Status: DC

## 2019-09-30 MED ORDER — SODIUM CHLORIDE 0.9% FLUSH
3.0000 mL | Freq: Two times a day (BID) | INTRAVENOUS | Status: DC
  Administered 2019-10-03 – 2019-10-07 (×9): 3 mL via INTRAVENOUS

## 2019-09-30 MED ORDER — CALCIUM CARBONATE ANTACID 500 MG PO CHEW
2.0000 | CHEWABLE_TABLET | Freq: Every day | ORAL | Status: DC
  Administered 2019-10-01: 09:00:00 400 mg via ORAL
  Filled 2019-09-30 (×4): qty 2

## 2019-09-30 MED ORDER — SODIUM CHLORIDE 0.9% FLUSH: 3.0000 mL | INTRAVENOUS | Status: DC | PRN

## 2019-09-30 MED ORDER — MIDAZOLAM HCL 2 MG/2ML IJ SOLN
0.5000 mg | Freq: Once | INTRAMUSCULAR | Status: AC
  Administered 2019-09-30: 0.5 mg via INTRAVENOUS
  Filled 2019-09-30: qty 2

## 2019-09-30 MED ORDER — ONDANSETRON HCL 4 MG/2ML IJ SOLN: 4.0000 mg | Freq: Four times a day (QID) | INTRAMUSCULAR | Status: DC | PRN

## 2019-09-30 MED ORDER — ZOLPIDEM TARTRATE 5 MG PO TABS
5.0000 mg | ORAL_TABLET | Freq: Every evening | ORAL | Status: DC | PRN
  Administered 2019-10-05 – 2019-10-06 (×2): 5 mg via ORAL
  Filled 2019-09-30 (×2): qty 1

## 2019-09-30 MED ORDER — ONDANSETRON HCL 4 MG/2ML IJ SOLN
INTRAMUSCULAR | Status: AC
  Administered 2019-09-30: 4 mg via INTRAVENOUS
  Filled 2019-09-30: qty 2

## 2019-09-30 MED ORDER — ONDANSETRON HCL 4 MG/2ML IJ SOLN: 4.0000 mg | Freq: Once | INTRAMUSCULAR | Status: AC

## 2019-09-30 MED ORDER — ETOMIDATE 2 MG/ML IV SOLN
10.0000 mg | Freq: Once | INTRAVENOUS | Status: AC
  Administered 2019-09-30: 18:00:00 10 mg via INTRAVENOUS
  Filled 2019-09-30: qty 10

## 2019-09-30 MED ORDER — VITAMIN D (CHOLECALCIFEROL) 25 MCG (1000 UT) PO CAPS: 1000.0000 [IU] | ORAL_CAPSULE | Freq: Every day | ORAL | Status: DC

## 2019-09-30 MED ORDER — VITAMIN D 25 MCG (1000 UNIT) PO TABS
1000.0000 [IU] | ORAL_TABLET | Freq: Every day | ORAL | Status: DC
  Administered 2019-10-01 – 2019-10-07 (×7): 1000 [IU] via ORAL
  Filled 2019-09-30 (×7): qty 1

## 2019-09-30 NOTE — ED Triage Notes (Signed)
Pt has had SOB on exertion for the last two weeks. Pt went to her PCP today where they found her in rapid AFIB at 160. Pt arrived via [facility]

## 2019-09-30 NOTE — Progress Notes (Signed)
   Patient sent over from the office for admission for persistent atrial fibrillation, RVR.  Her heart rate is improving with IV Cardizem, continue this.  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  Covid is pending.  Other labs are stable except for a borderline low potassium, will supplement.  Blood sugar has been elevated the last 2 times it was checked, check a hemoglobin A1c.  Admission orders are written.  Theodore Demark, PA-C 09/30/2019 7:27 PM

## 2019-09-30 NOTE — Progress Notes (Signed)
Cardiology Office Note   Date:  09/30/2019   ID:  Terresa, Marlett Nov 08, 1940, MRN [id number]  PCP:  [name], MD  Cardiologist:  Dr.Hochrein  No chief complaint on file.    History of Present Illness: Lynn Hayden is a 79 y.o. female who presents for ongoing assessment and management of chronic chest pain. She presented the emergency room 08/25/2019 with tachycardia and was found to be in atrial fibrillation with RVR.    She was given medications for rate control and converted spontaneously.  Echocardiogram showed preserved LV function with moderate left atrial dilatation.  Patient was placed on diltiazem 120 mg a day and Eliquis 5 mg twice daily.  A follow up NM stress test was completed on /28/2021 and was low risk.   She was last seen by Corine Shelter, PA and was reduced on her Eliquis to 2.5 mg BID due weight <50 kg.  Here for follow up. Will need EKG today.   Past Medical History:  Diagnosis Date  . Arthritis   . Chronic diarrhea   . Hemorrhoid   . Hypertrophic cardiomegaly   . IBS (irritable bowel syndrome)   . Insomnia   . Miscarriage   . Osteoporosis   . Thyromegaly   . Vitamin D deficiency     Past Surgical History:  Procedure Laterality Date  . CATARACT EXTRACTION    . DILATION AND CURETTAGE OF UTERUS    . EAR CYST EXCISION  11/09/2011   Procedure: CYST REMOVAL;  Surgeon: Tami Ribas, MD;  Location: [facility];  Service: Orthopedics;  Laterality: Right;  right index excision cyst/foreign body and debridement DIP joint  . GANGLION CYST EXCISION     rt wrist  . GANGLION CYST EXCISION    . TONSILLECTOMY    . WISDOM TOOTH EXTRACTION       Current Outpatient Medications  Medication Sig Dispense Refill  . apixaban (ELIQUIS) 5 MG TABS tablet Take 1 tablet (5 mg total) by mouth 2 (two) times daily. 180 tablet 3  . Calcium Carb-Cholecalciferol [phone] MG-UNIT TABS Take 2 tablets by mouth daily.     Marland Kitchen diltiazem (CARDIZEM CD) 120 MG 24 hr  capsule Take 1 capsule (120 mg total) by mouth every morning. 90 capsule 3  . diphenoxylate-atropine (LOMOTIL) 2.5-0.025 MG per tablet Take 1 tablet by mouth as needed for diarrhea or loose stools. 30 tablet 4  . ondansetron (ZOFRAN-ODT) 4 MG disintegrating tablet Take 1 tablet (4 mg total) by mouth every 8 (eight) hours as needed for nausea or vomiting. 8 tablet 0  . temazepam (RESTORIL) 15 MG capsule Take 15 mg by mouth at bedtime as needed for sleep.     No current facility-administered medications for this visit.    Allergies:   Amoxicillin    Social History:  The patient  reports that she quit smoking about 30 years ago. She has never used smokeless tobacco. She reports current alcohol use. She reports that she does not use drugs.   Family History:  The patient's family history includes Dementia in her father; Diabetes in her father; Lymphoma in her sister; Vascular Disease in her mother.    ROS: All other systems are reviewed and negative. Unless otherwise mentioned in H&P    PHYSICAL EXAM: VS:  There were no vitals taken for this visit. , BMI There is no height or weight on file to calculate BMI. GEN: Well nourished, well developed, in no acute distress HEENT: normal Neck:  no JVD, carotid bruits, or masses Cardiac: IRRR; no murmurs, rubs, or gallops,no edema  Respiratory:  Clear to auscultation bilaterally, normal work of breathing GI: soft, nontender, nondistended, + BS MS: no deformity or atrophy Skin: warm and dry, no rash Neuro:  Strength and sensation are intact Psych: euthymic mood, full affect   EKG:  Atrial fib with RVR rate of 162 bpm.  Recent Labs: 08/25/2019: BUN 15; Creatinine, Ser 0.67; Hemoglobin 14.5; Platelets 177; Potassium 4.0; Sodium 140    Lipid Panel No results found for: CHOL, TRIG, HDL, CHOLHDL, VLDL, LDLCALC, LDLDIRECT    Wt Readings from Last 3 Encounters:  09/02/19 108 lb 3.2 oz (49.1 kg)  08/29/19 108 lb (49 kg)  08/25/19 110 lb (49.9 kg)        Other studies Reviewed: NM Stress Test Study Highlights    There was no ST segment deviation noted during stress.  The study is normal.  This is a low risk study.   Normal stress nuclear study with no ischemia or infarction.  Study not gated due to underlying atrial fibrillation.   Echocardiogram 07/11/2019 1. Left ventricular ejection fraction, by visual estimation, is 55 to  60%. The left ventricle has normal function. Left ventricular septal wall  thickness was normal. Normal left ventricular posterior wall thickness.  There is no left ventricular  hypertrophy.  2. Left ventricular diastolic function could not be evaluated.  3. The left ventricle has no regional wall motion abnormalities.  4. No intracavitarty gradient noted at rest or with valsalva.  5. Global right ventricle has normal systolic function.The right  ventricular size is normal. No increase in right ventricular wall  thickness.  6. Left atrial size was moderately dilated.  7. Right atrial size was normal.  8. Trivial pericardial effusion is present.  9. The mitral valve is normal in structure. Mild mitral valve  regurgitation. No evidence of mitral stenosis.  10. The tricuspid valve is normal in structure.  11. The tricuspid valve is normal in structure. Tricuspid valve  regurgitation is mild.  12. The aortic valve is tricuspid. Aortic valve regurgitation is not  visualized. No evidence of aortic valve sclerosis or stenosis.  13. Pulmonic regurgitation is mild.  14. The pulmonic valve was normal in structure. Pulmonic valve  regurgitation is mild.  15. Moderately elevated pulmonary artery systolic pressure.  16. The inferior vena cava is normal in size with greater than 50%  respiratory variability, suggesting right atrial pressure of 3 mmHg.   In comparison to the previous echocardiogram(s): 05/21/14 EF 60-65%. PA  pressure 68mmHg.    ASSESSMENT AND PLAN:  1.  Atrial fib with RVR  -Sent to ED via EMS.   Current medicines are reviewed at length with the patient today.  I have spent 45 minutes dedicated to the care of this patient on the date of this encounter to include pre-visit review of records, assessment, management and diagnostic testing,with shared decision making.  Labs/ tests ordered today include: Sent to [facility]. [place] Pugh, ANP, AACC   09/30/2019 7:45 AM    [address] Office [phone] Fax [phone]  Notice: This dictation was prepared with Dragon dictation along with smaller phrase technology. Any transcriptional errors that result from this process are unintentional and may not be corrected upon review.

## 2019-09-30 NOTE — H&P (Addendum)
Date:  09/30/2019   ID:  Arlo, Buffone 1941/01/24, MRN [id number]  PCP:  [name], MD  Cardiologist:  Dr.Hochrein  No chief complaint on file.    History of Present Illness: Lynn Hayden is a 79 y.o. female who presents for ongoing assessment and management of chronic chest pain. She presented the emergency room 08/25/2019 with tachycardia and was found to be in atrial fibrillation with RVR.    She was given medications for rate control and converted spontaneously.  Echocardiogram showed preserved LV function with moderate left atrial dilatation.  Patient was placed on diltiazem 120 mg a day and Eliquis 5 mg twice daily.  A follow up NM stress test was completed on /28/2021 and was low risk.   She was last seen by Corine Shelter, PA and was reduced on her Eliquis to 2.5 mg BID due weight <50 kg.  Here for follow up.  She comes today with worsening dyspnea over the last two weeks. She has been compliant with her diltiazem and Eliquis taking it everyday. She is tired. Denies chest pain, dizziness, or bleeding.   Past Medical History:  Diagnosis Date   Arthritis    Chronic diarrhea    Hemorrhoid    Hypertrophic cardiomegaly    IBS (irritable bowel syndrome)    Insomnia    Miscarriage    Osteoporosis    Thyromegaly    Vitamin D deficiency     Past Surgical History:  Procedure Laterality Date   CATARACT EXTRACTION     DILATION AND CURETTAGE OF UTERUS     EAR CYST EXCISION  11/09/2011   Procedure: CYST REMOVAL;  Surgeon: Tami Ribas, MD;  Location: [facility];  Service: Orthopedics;  Laterality: Right;  right index excision cyst/foreign body and debridement DIP joint   GANGLION CYST EXCISION     rt wrist   GANGLION CYST EXCISION     TONSILLECTOMY     WISDOM TOOTH EXTRACTION       Current Outpatient Medications  Medication Sig Dispense Refill   apixaban (ELIQUIS) 5 MG TABS tablet Take 1 tablet (5 mg total) by mouth 2 (two) times daily. 180 tablet 3    Calcium Carb-Cholecalciferol [phone] MG-UNIT TABS Take 2 tablets by mouth daily.      diltiazem (CARDIZEM CD) 120 MG 24 hr capsule Take 1 capsule (120 mg total) by mouth every morning. 90 capsule 3   diphenoxylate-atropine (LOMOTIL) 2.5-0.025 MG per tablet Take 1 tablet by mouth as needed for diarrhea or loose stools. 30 tablet 4   ondansetron (ZOFRAN-ODT) 4 MG disintegrating tablet Take 1 tablet (4 mg total) by mouth every 8 (eight) hours as needed for nausea or vomiting. 8 tablet 0   temazepam (RESTORIL) 15 MG capsule Take 15 mg by mouth at bedtime as needed for sleep.     No current facility-administered medications for this visit.    Allergies:   Amoxicillin    Social History:  The patient  reports that she quit smoking about 30 years ago. She has never used smokeless tobacco. She reports current alcohol use. She reports that she does not use drugs.   Family History:  The patient's family history includes Dementia in her father; Diabetes in her father; Lymphoma in her sister; Vascular Disease in her mother.    ROS: All other systems are reviewed and negative. Unless otherwise mentioned in H&P    PHYSICAL EXAM: VS:  There were no vitals taken for this visit. ,  BMI There is no height or weight on file to calculate BMI. GEN: Well nourished, well developed, in no acute distress HEENT: normal Neck: no JVD, carotid bruits, or masses Cardiac: Rapid Irregular rhythm and rate, ; no murmurs, rubs, or gallops,no edema  Respiratory: Mild crackles in the bases. No wheezes. GI: soft, nontender, nondistended, + BS MS: no deformity or atrophy Skin: warm and dry, no rash Neuro:  Strength and sensation are intact Psych: euthymic mood, full affect   EKG:  Atrial fib with RVR, rates of 162 bpm., repeat EKG 142 bpm. Recent Labs: 08/25/2019: BUN 15; Creatinine, Ser 0.h67; Hemoglobin 14.5; Platelets 177; Potassium 4.0; Sodium 140    Lipid Panel No results found for: CHOL, TRIG, HDL, CHOLHDL,  VLDL, LDLCALC, LDLDIRECT    Wt Readings from Last 3 Encounters:  09/02/19 108 lb 3.2 oz (49.1 kg)  08/29/19 108 lb (49 kg)  08/25/19 110 lb (49.9 kg)      Other studies Reviewed: NM Stress Test Study Highlights    There was no ST segment deviation noted during stress. The study is normal. This is a low risk study.   Normal stress nuclear study with no ischemia or infarction.  Study not gated due to underlying atrial fibrillation.   Echocardiogram 07/11/2019 1. Left ventricular ejection fraction, by visual estimation, is 55 to  60%. The left ventricle has normal function. Left ventricular septal wall  thickness was normal. Normal left ventricular posterior wall thickness.  There is no left ventricular  hypertrophy.   2. Left ventricular diastolic function could not be evaluated.   3. The left ventricle has no regional wall motion abnormalities.   4. No intracavitarty gradient noted at rest or with valsalva.   5. Global right ventricle has normal systolic function.The right  ventricular size is normal. No increase in right ventricular wall  thickness.   6. Left atrial size was moderately dilated.   7. Right atrial size was normal.   8. Trivial pericardial effusion is present.   9. The mitral valve is normal in structure. Mild mitral valve  regurgitation. No evidence of mitral stenosis.  10. The tricuspid valve is normal in structure.  11. The tricuspid valve is normal in structure. Tricuspid valve  regurgitation is mild.  12. The aortic valve is tricuspid. Aortic valve regurgitation is not  visualized. No evidence of aortic valve sclerosis or stenosis.  13. Pulmonic regurgitation is mild.  14. The pulmonic valve was normal in structure. Pulmonic valve  regurgitation is mild.  15. Moderately elevated pulmonary artery systolic pressure.  16. The inferior vena cava is normal in size with greater than 50%  respiratory variability, suggesting right atrial pressure of 3 mmHg.    In comparison to the previous echocardiogram(s): 05/21/14 EF 60-65%. PA   ASSESSMENT & PLAN  1. Paroxysmal Atrial fib with RVR: She is symptomatic with worsening dyspnea.  She may have some CHF.  HR is not controlled on diltiazem 120 mg daily. I will send this patient to ED for possible IV cardioversion vs DCCV at the discretion of cardiology. May need EP consultation and treatment for CHF. She has been compliant with Eliquis. Rcommend CBC to check for anemia, as well as BMET for baseline kidney function. I have discussed this with Dr.Berry DOD at [place] who agrees with my assessment and plan. EMS is being called for transport to the ED. I have notified Rosaria Ferries, PA and given her report.   2. Hx of hypertrophic CM: Last echo 07/11/2019  as above.  3. Probable CHF: Likely need diureses.  Will leave to discretion of admitting cardiologist.  4. History of Thyromegaly:  She is not on thyroid medications.   Agree with note by Joni Reining, NP.    Patient has A. fib with RVR and signs of heart failure.  Will admit for rate control and diuresis potential cardioversion.  She has been on oral anticoagulants.  Runell Gess, M.D., FACP, [facility], [facility] [address] [place], [place]  [address]  [phone] 09/30/2019 5:05 PM

## 2019-09-30 NOTE — ED Notes (Signed)
Paged admitting to request change in bed request from Tele to SDU d/t cardizem drip.

## 2019-09-30 NOTE — ED Provider Notes (Signed)
[facility] EMERGENCY DEPARTMENT Provider Note   CSN: [id number] Arrival date & time: 09/30/19  1631     History Chief Complaint  Patient presents with  . Atrial Fibrillation  . Shortness of Breath    Lynn Hayden is a 79 y.o. female.  Pt presents to the ED today with sob and rapid HR.  Pt was here about 1 month ago (3/22) and was diagnosed with afib.  She spontaneously converted and was put on Eliquis and cardizem.  Pt has been feeling more sob and feeling like her heart has been racing more for the past 2 weeks.  She had an appt with cards this afternoon and was found to be in afib with HR in the 160s.  EMS was called to take her to the ED.  They gave her 20 mg cardizem IV enroute.  This brought HR down to the 120s, but it is back up.        Past Medical History:  Diagnosis Date  . Arthritis   . Chronic diarrhea   . Hemorrhoid   . Hypertrophic cardiomegaly   . IBS (irritable bowel syndrome)   . Insomnia   . Miscarriage   . Osteoporosis   . Thyromegaly   . Vitamin D deficiency     Patient Active Problem List   Diagnosis Date Noted  . Persistent atrial fibrillation with rapid ventricular response (HCC) 09/30/2019  . Chronic anticoagulation 09/02/2019  . Paroxysmal atrial fibrillation (HCC) 08/25/2019  . Infection of urinary tract 01/06/2015  . Thyromegaly 07/01/2014  . Insomnia 07/01/2014  . Abnormal EKG 05/12/2014  . Murmur 03/22/2012    Past Surgical History:  Procedure Laterality Date  . CATARACT EXTRACTION    . DILATION AND CURETTAGE OF UTERUS    . EAR CYST EXCISION  11/09/2011   Procedure: CYST REMOVAL;  Surgeon: Tami Ribas, MD;  Location: [facility];  Service: Orthopedics;  Laterality: Right;  right index excision cyst/foreign body and debridement DIP joint  . GANGLION CYST EXCISION     rt wrist  . GANGLION CYST EXCISION    . TONSILLECTOMY    . WISDOM TOOTH EXTRACTION       OB History   No obstetric history on  file.     Family History  Problem Relation Age of Onset  . Vascular Disease Mother   . Diabetes Father   . Dementia Father   . Lymphoma Sister     Social History   Tobacco Use  . Smoking status: Former Smoker    Quit date: 11/06/1988    Years since quitting: 30.9  . Smokeless tobacco: Never Used  Substance Use Topics  . Alcohol use: Yes    Comment: rare  . Drug use: No    Home Medications Prior to Admission medications   Medication Sig Start Date End Date Taking? Authorizing Provider  apixaban (ELIQUIS) 5 MG TABS tablet Take 1 tablet (5 mg total) by mouth 2 (two) times daily. 08/28/19  Yes Rollene Rotunda, MD  Calcium Carb-Cholecalciferol [phone] MG-UNIT TABS Take 2 tablets by mouth daily.    Yes [provider]  diltiazem (CARDIZEM CD) 120 MG 24 hr capsule Take 1 capsule (120 mg total) by mouth every morning. 09/02/19 12/01/19 Yes Croitoru, Mihai, MD  diphenoxylate-atropine (LOMOTIL) 2.5-0.025 MG per tablet Take 1 tablet by mouth as needed for diarrhea or loose stools. 12/14/14  Yes Weaver, Layne C, NP  temazepam (RESTORIL) 15 MG capsule Take 15 mg by  mouth at bedtime as needed for sleep.   Yes [provider]  Vitamin D, Cholecalciferol, 25 MCG (1000 UT) CAPS Take 1,000 Units by mouth daily.   Yes [provider]  ondansetron (ZOFRAN-ODT) 4 MG disintegrating tablet Take 1 tablet (4 mg total) by mouth every 8 (eight) hours as needed for nausea or vomiting. Patient not taking: Reported on 09/30/2019 08/25/19   Benjiman Core, MD    Allergies    Amoxicillin  Review of Systems   Review of Systems  Respiratory: Positive for shortness of breath.   Cardiovascular: Positive for chest pain and palpitations.  All other systems reviewed and are negative.   Physical Exam Updated Vital Signs BP 122/71   Pulse 85   Temp 98 F (36.7 C) (Oral)   Resp (!) 27   Ht 5\' 4"  (1.626 m)   Wt 52.6 kg   SpO2 93%   BMI 19.91 kg/m   Physical Exam Vitals and  nursing note reviewed.  Constitutional:      Appearance: She is well-developed.  HENT:     Head: Normocephalic and atraumatic.     Mouth/Throat:     Mouth: Mucous membranes are moist.     Pharynx: Oropharynx is clear.  Eyes:     Extraocular Movements: Extraocular movements intact.     Pupils: Pupils are equal, round, and reactive to light.  Cardiovascular:     Rate and Rhythm: Tachycardia present. Rhythm irregular.  Pulmonary:     Effort: Pulmonary effort is normal.     Breath sounds: Normal breath sounds.  Abdominal:     General: Bowel sounds are normal.     Palpations: Abdomen is soft.  Musculoskeletal:        General: Normal range of motion.     Cervical back: Normal range of motion and neck supple.  Skin:    General: Skin is warm.     Capillary Refill: Capillary refill takes less than 2 seconds.  Neurological:     General: No focal deficit present.     Mental Status: She is alert and oriented to person, place, and time.  Psychiatric:        Mood and Affect: Mood normal.        Behavior: Behavior normal.     ED Results / Procedures / Treatments   Labs (all labs ordered are listed, but only abnormal results are displayed) Labs Reviewed  BASIC METABOLIC PANEL - Abnormal; Notable for the following components:      Result Value   CO2 21 (*)    Glucose, Bld 109 (*)    All other components within normal limits  RESPIRATORY PANEL BY RT PCR (FLU A&B, COVID)  MAGNESIUM  CBC  TSH  BRAIN NATRIURETIC PEPTIDE  HEPATIC FUNCTION PANEL  BASIC METABOLIC PANEL  HEMOGLOBIN A1C  TROPONIN I (HIGH SENSITIVITY)  TROPONIN I (HIGH SENSITIVITY)    EKG EKG Interpretation  Date/Time:  Tuesday September 30 2019 16:43:01 EDT Ventricular Rate:  131 PR Interval:    QRS Duration: 84 QT Interval:  314 QTC Calculation: 411 R Axis:   75 Text Interpretation: Atrial fibrillation afib with rvr Confirmed by [id number] [phone]) on 09/30/2019 4:55:04 PM   Radiology DG Chest Port 1  View  Result Date: 09/30/2019 CLINICAL DATA:  Dyspnea on exertion. EXAM: PORTABLE CHEST 1 VIEW COMPARISON:  August 25, 2019. FINDINGS: Stable cardiomediastinal silhouette. No pneumothorax is noted. Minimal bibasilar subsegmental atelectasis is noted. Small left pleural effusion is noted. Bony thorax is  unremarkable. IMPRESSION: Minimal bibasilar subsegmental atelectasis. Small left pleural effusion. Electronically Signed   By: Marijo Conception M.D.   On: 09/30/2019 16:57    Procedures .Cardioversion  Date/Time: 09/30/2019 6:07 PM Performed by: Isla Pence, MD Authorized by: Isla Pence, MD   Consent:    Consent obtained:  Written   Consent given by:  Patient   Alternatives discussed:  No treatment Pre-procedure details:    Cardioversion basis:  Emergent   Rhythm:  Atrial fibrillation   Electrode placement:  Anterior-posterior Patient sedated: Yes. Refer to sedation procedure documentation for details of sedation.  Attempt one:    Cardioversion mode:  Synchronous   Waveform:  Biphasic   Shock (Joules):  200   Shock outcome:  No change in rhythm Attempt two:    Cardioversion mode:  Synchronous   Waveform:  Biphasic   Shock (Joules):  200   Shock outcome:  No change in rhythm Post-procedure details:    Patient status:  Awake   Patient tolerance of procedure:  Tolerated with difficulty .Sedation  Date/Time: 09/30/2019 6:08 PM Performed by: Isla Pence, MD Authorized by: Isla Pence, MD   Consent:    Consent obtained:  Written   Consent given by:  Patient Universal protocol:    Immediately prior to procedure a time out was called: yes   Pre-sedation assessment:    Time since last food or drink:  5   ASA classification: class 2 - patient with mild systemic disease     Mallampati score:  II - soft palate, uvula, fauces visible   Pre-sedation assessments completed and reviewed: airway patency, cardiovascular function, hydration status, mental status,  nausea/vomiting, pain level, respiratory function and temperature     Pre-sedation assessment completed:  09/30/2019 5:38 PM Immediate pre-procedure details:    Reassessment: Patient reassessed immediately prior to procedure     Reviewed: vital signs     Verified: bag valve mask available, emergency equipment available, intubation equipment available, IV patency confirmed, oxygen available and reversal medications available   Procedure details (see MAR for exact dosages):    Preoxygenation:  Room air   Sedation:  Etomidate and midazolam   Intended level of sedation: deep   Intra-procedure events: hypoxia     Intra-procedure management:  Airway repositioning, BVM ventilation and supplemental oxygen   Total Provider sedation time (minutes):  30 Post-procedure details:    Post-sedation assessment completed:  09/30/2019 6:09 PM   Attendance: Constant attendance by certified staff until patient recovered     Recovery: Patient returned to pre-procedure baseline     Patient is stable for discharge or admission: yes     Patient tolerance:  Tolerated well, no immediate complications Comments:     Pt's oxygen dropped briefly after sedation meds give.  Airway repositioned and she was bagged very briefly.  Osygen came up quickly and pt is not requiring oxygen now that she is awake.   (including critical care time)  Medications Ordered in ED Medications  diltiazem (CARDIZEM) 1 mg/mL load via infusion 20 mg (20 mg Intravenous Bolus from Bag 09/30/19 1817)    And  diltiazem (CARDIZEM) 125 mg in dextrose 5% 125 mL (1 mg/mL) infusion (5 mg/hr Intravenous New Bag/Given 09/30/19 1817)  nitroGLYCERIN (NITROSTAT) SL tablet 0.4 mg (has no administration in time range)  acetaminophen (TYLENOL) tablet 650 mg (has no administration in time range)  ondansetron (ZOFRAN) injection 4 mg (has no administration in time range)  zolpidem (AMBIEN) tablet 5 mg (has no administration  in time range)  sodium chloride flush  (NS) 0.9 % injection 3 mL (has no administration in time range)  sodium chloride flush (NS) 0.9 % injection 3 mL (has no administration in time range)  0.9 %  sodium chloride infusion (has no administration in time range)  ALPRAZolam (XANAX) tablet 0.25 mg (has no administration in time range)  temazepam (RESTORIL) capsule 15 mg (has no administration in time range)  Vitamin D (Cholecalciferol) CAPS 1,000 Units (has no administration in time range)  potassium chloride SA (KLOR-CON) CR tablet 40 mEq (has no administration in time range)  etomidate (AMIDATE) injection 10 mg (10 mg Intravenous Given by Other 09/30/19 1749)  midazolam (VERSED) injection 0.5 mg (0.5 mg Intravenous Given 09/30/19 1749)  ondansetron (ZOFRAN) injection 4 mg (4 mg Intravenous Given 09/30/19 1750)    ED Course  I have reviewed the triage vital signs and the nursing notes.  Pertinent labs & imaging results that were available during my care of the patient were reviewed by me and considered in my medical decision making (see chart for details).    MDM Rules/Calculators/A&P                       Pt d/w cards who recommends cardioversion as pt has been compliant with her Eliquis.    This was attempted at 200 J twice without cardioversion.  Pt started on a cardizem drip. This has helped her HR.  She was d/w cards again who will admit.  CHA2DS2/VAS Stroke Risk Points  Current as of 3 minutes ago     4 >= 2 Points: High Risk  1 - 1.99 Points: Medium Risk  0 Points: Low Risk    The previous score was 3 on 08/25/2019.: Last Change:     Details    This score determines the patient's risk of having a stroke if the  patient has atrial fibrillation.       Points Metrics  1 Has Congestive Heart Failure:  Yes    Current as of 3 minutes ago  0 Has Vascular Disease:  No    Current as of 3 minutes ago  0 Has Hypertension:  No    Current as of 3 minutes ago  2 Age:  75    Current as of 3 minutes ago  0 Has Diabetes:  No     Current as of 3 minutes ago  0 Had Stroke:  No  Had TIA:  No  Had thromboembolism:  No    Current as of 3 minutes ago  1 Female:  Yes    Current as of 3 minutes ago          CRITICAL CARE Performed by: Jacalyn Lefevre   Total critical care time: 30 minutes  Critical care time was exclusive of separately billable procedures and treating other patients.  Critical care was necessary to treat or prevent imminent or life-threatening deterioration.  Critical care was time spent personally by me on the following activities: development of treatment plan with patient and/or surrogate as well as nursing, discussions with consultants, evaluation of patient's response to treatment, examination of patient, obtaining history from patient or surrogate, ordering and performing treatments and interventions, ordering and review of laboratory studies, ordering and review of radiographic studies, pulse oximetry and re-evaluation of patient's condition.  Final Clinical Impression(s) / ED Diagnoses Final diagnoses:  Atrial fibrillation with RVR (HCC)    Rx / DC Orders ED Discharge Orders  Ordered    Amb referral to AFIB Clinic  Status:  Canceled     09/30/19 1639           Jacalyn LefevreHaviland, Alina Gilkey, MD 09/30/19 [phone]

## 2019-10-01 ENCOUNTER — Encounter (HOSPITAL_COMMUNITY): Payer: Self-pay | Admitting: Cardiovascular Disease

## 2019-10-01 ENCOUNTER — Inpatient Hospital Stay (HOSPITAL_COMMUNITY): Payer: Medicare HMO

## 2019-10-01 DIAGNOSIS — I351 Nonrheumatic aortic (valve) insufficiency: Secondary | ICD-10-CM | POA: Diagnosis not present

## 2019-10-01 DIAGNOSIS — F5101 Primary insomnia: Secondary | ICD-10-CM

## 2019-10-01 DIAGNOSIS — I4891 Unspecified atrial fibrillation: Secondary | ICD-10-CM | POA: Diagnosis not present

## 2019-10-01 DIAGNOSIS — I5031 Acute diastolic (congestive) heart failure: Secondary | ICD-10-CM | POA: Diagnosis not present

## 2019-10-01 DIAGNOSIS — I340 Nonrheumatic mitral (valve) insufficiency: Secondary | ICD-10-CM | POA: Diagnosis not present

## 2019-10-01 DIAGNOSIS — E0590 Thyrotoxicosis, unspecified without thyrotoxic crisis or storm: Secondary | ICD-10-CM | POA: Diagnosis present

## 2019-10-01 DIAGNOSIS — R7303 Prediabetes: Secondary | ICD-10-CM | POA: Diagnosis present

## 2019-10-01 DIAGNOSIS — I361 Nonrheumatic tricuspid (valve) insufficiency: Secondary | ICD-10-CM

## 2019-10-01 LAB — BASIC METABOLIC PANEL
Anion gap: 9 (ref 5–15)
BUN: 14 mg/dL (ref 8–23)
CO2: 21 mmol/L — ABNORMAL LOW (ref 22–32)
Calcium: 9 mg/dL (ref 8.9–10.3)
Chloride: 109 mmol/L (ref 98–111)
Creatinine, Ser: 0.74 mg/dL (ref 0.44–1.00)
GFR calc Af Amer: >60 mL/min (ref >60)
GFR calc non Af Amer: >60 mL/min (ref >60)
Glucose, Bld: 194 mg/dL — ABNORMAL HIGH (ref 70–99)
Potassium: 3.8 mmol/L (ref 3.5–5.1)
Sodium: 139 mmol/L (ref 135–145)

## 2019-10-01 LAB — ECHOCARDIOGRAM LIMITED
Height: 64 in
Weight: 1834.23 oz

## 2019-10-01 LAB — T4, FREE: Free T4: 3.62 ng/dL — ABNORMAL HIGH (ref 0.61–1.12)

## 2019-10-01 LAB — MRSA PCR SCREENING: MRSA by PCR: NEGATIVE

## 2019-10-01 MED ORDER — METHIMAZOLE 10 MG PO TABS
10.0000 mg | ORAL_TABLET | Freq: Two times a day (BID) | ORAL | Status: DC
  Administered 2019-10-01 – 2019-10-07 (×12): 10 mg via ORAL
  Filled 2019-10-01 (×12): qty 1

## 2019-10-01 MED ORDER — ONDANSETRON HCL 4 MG PO TABS: 4.0000 mg | ORAL_TABLET | Freq: Three times a day (TID) | ORAL | Status: DC | PRN

## 2019-10-01 MED ORDER — LOPERAMIDE HCL 2 MG PO CAPS
2.0000 mg | ORAL_CAPSULE | ORAL | Status: DC | PRN
  Administered 2019-10-01 – 2019-10-07 (×8): 2 mg via ORAL
  Filled 2019-10-01 (×8): qty 1

## 2019-10-01 MED ORDER — ATENOLOL 25 MG PO TABS
25.0000 mg | ORAL_TABLET | Freq: Every day | ORAL | Status: DC
  Administered 2019-10-01: 25 mg via ORAL
  Filled 2019-10-01 (×2): qty 1

## 2019-10-01 NOTE — Progress Notes (Signed)
Patient converted to NSR at approximately 2045, BP dropped to 75/58 at 2130, Cardizem gtt stopped.  Dr. Okey Dupre notified of conversion, HR, BP, and stopping of gtt via text page, no response, no new orders.  Patient A&Ox4, calm.  BP now 103/66. Will continue to monitor.

## 2019-10-01 NOTE — Progress Notes (Signed)
  Echocardiogram 2D Echocardiogram has been performed.  Lynn Hayden 10/01/2019, 2:50 PM

## 2019-10-01 NOTE — Consult Note (Signed)
Medical Consultation   Lynn Hayden  PRF:[id number]  DOB: 01-01-1941  DOA: 09/30/2019  PCP: No primary care provider on file.   Requesting physician: Dr. Antoine Poche  Reason for consultation: Low TSH   History of Present Illness: Lynn Hayden is an 79 y.o. female insomnia, former tobacco abuse, osteoporosis admitted with A. fib with RVR.  Patient came to ER on 08/25/2019 with tachycardia and was found to be in A. fib with RVR she was given rate control medication and converted spontaneously.  Echo showed preserved left ventricular function with moderate left atrial dilatation.  Patient was placed on diltiazem 120 mg daily and Eliquis 5 mg twice a day and follow-up NM stress test was done on 08/31/2019 which was low risk.  She presented on 4/27 with worsening dyspnea on exertion since 2 weeks.  She failed cardioversion in the ED.  She placed on Cardizem drip and was admitted at stepdown unit for close monitoring.  Her TSH level noted to be less than 0.010.  Atenolol 25 mg once daily added and tried hospitalist consulted for management of hyperthyroidism.  Patient tells me that she is doing fine, has exertional shortness of breath however denies headache, blurry vision, chest pain, palpitation, leg swelling, nausea, vomiting.  She tells me that she never diagnosed with thyroid issues in the past.  No family history of thyroid issues.  Has chronic diarrhea since 40 years, 1-4 episodes per day, nonbloody, takes Imodium which helps in her symptoms.  Reports unintentional weight loss of 10 pounds in last 3 months, insomnia and heat intolerance.  No tremors, dysphagia, pain or swelling in neck.  She is a former smoker, denies alcohol, illicit drug use.  Past Medical History:  Diagnosis Date  . Arthritis   . Chronic diarrhea   . Hemorrhoid   . Hypertrophic cardiomegaly   . IBS (irritable bowel syndrome)   . Insomnia   . Miscarriage   . Osteoporosis   . Thyromegaly   .  Vitamin D deficiency          Review of Systems:  ROS As per HPI otherwise 10 point review of systems negative.     Past Medical History: Past Medical History:  Diagnosis Date  . Arthritis   . Chronic diarrhea   . Hemorrhoid   . Hypertrophic cardiomegaly   . IBS (irritable bowel syndrome)   . Insomnia   . Miscarriage   . Osteoporosis   . Thyromegaly   . Vitamin D deficiency     Past Surgical History: Past Surgical History:  Procedure Laterality Date  . CATARACT EXTRACTION    . DILATION AND CURETTAGE OF UTERUS    . EAR CYST EXCISION  11/09/2011   Procedure: CYST REMOVAL;  Surgeon: Tami Ribas, MD;  Location: [facility];  Service: Orthopedics;  Laterality: Right;  right index excision cyst/foreign body and debridement DIP joint  . GANGLION CYST EXCISION     rt wrist  . GANGLION CYST EXCISION    . TONSILLECTOMY    . WISDOM TOOTH EXTRACTION       Allergies:   Allergies  Allergen Reactions  . Amoxicillin Rash     Social History:  reports that she quit smoking about 30 years ago. She has never used smokeless tobacco. She reports current alcohol use. She reports that she does not use drugs.   Family History: Family History  Problem Relation  Age of Onset  . Vascular Disease Mother   . Diabetes Father   . Dementia Father   . Lymphoma Sister       Physical Exam: Vitals:   10/01/19 0752 10/01/19 0800 10/01/19 1036 10/01/19 1200  BP:  136/74 135/72 111/69  Pulse: (!) 58 89 (!) 139 77  Resp: (!) 26 20  (!) 21  Temp:  98 F (36.7 C)    TempSrc:  Oral    SpO2: 92% 93%  98%  Weight:      Height:        Constitutional: Appearance,  Alert and awake, oriented x3, not in any acute distress. Eyes: PERLA, EOMI, irises appear normal, anicteric sclera,  ENMT: external ears and nose appear normal, normal hearing or hard of hearing            Lips appears normal, oropharynx mucosa, tongue, posterior pharynx appear normal  Neck: neck appears  normal, no masses, normal ROM, no thyromegaly, no JVD  CVS: Irregularly irregular rhythm, no murmur rubs or gallops, no LE edema, normal pedal pulses  Respiratory:  clear to auscultation bilaterally, no wheezing, rales or rhonchi. Respiratory effort normal. No accessory muscle use.  Abdomen: soft nontender, nondistended, normal bowel sounds, no hepatosplenomegaly, no hernias  Musculoskeletal: : no cyanosis, clubbing or edema noted bilaterally                       Joint/bones/muscle exam, strength, contractures or atrophy Neuro: Cranial nerves II-XII intact, strength, sensation, reflexes Psych: judgement and insight appear normal, stable mood and affect, mental status Skin: no rashes or lesions or ulcers, no induration or nodules    Data reviewed:  I have personally reviewed following labs and imaging studies Labs:  CBC: Recent Labs  Lab 09/30/19 1715  WBC 5.5  HGB 12.2  HCT 37.9  MCV 85.7  PLT 627    Basic Metabolic Panel: Recent Labs  Lab 09/30/19 1715 10/01/19 0804  NA 139 139  K 3.5 3.8  CL 109 109  CO2 21* 21*  GLUCOSE 109* 194*  BUN 17 14  CREATININE 0.79 0.74  CALCIUM 9.1 9.0  MG 1.9  --    GFR Estimated Creatinine Clearance: 46.8 mL/min (by C-G formula based on SCr of 0.74 mg/dL). Liver Function Tests: Recent Labs  Lab 09/30/19 1950  AST 23  ALT 19  ALKPHOS 89  BILITOT 1.4*  PROT 5.9*  ALBUMIN 3.1*   No results for input(s): LIPASE, AMYLASE in the last 168 hours. No results for input(s): AMMONIA in the last 168 hours. Coagulation profile No results for input(s): INR, PROTIME in the last 168 hours.  Cardiac Enzymes: No results for input(s): CKTOTAL, CKMB, CKMBINDEX, TROPONINI in the last 168 hours. BNP: Invalid input(s): POCBNP CBG: No results for input(s): GLUCAP in the last 168 hours. D-Dimer No results for input(s): DDIMER in the last 72 hours. Hgb A1c Recent Labs    09/30/19 1950  HGBA1C 6.0*   Lipid Profile No results for input(s):  CHOL, HDL, LDLCALC, TRIG, CHOLHDL, LDLDIRECT in the last 72 hours. Thyroid function studies Recent Labs    09/30/19 1950  TSH <0.010*   Anemia work up No results for input(s): VITAMINB12, FOLATE, FERRITIN, TIBC, IRON, RETICCTPCT in the last 72 hours. Urinalysis    Component Value Date/Time   BILIRUBINUR negative 03/12/2015 1130   PROTEINUR negative 03/12/2015 1130   UROBILINOGEN 0.2 03/12/2015 1130   NITRITE negative 03/12/2015 1130   LEUKOCYTESUR Trace (A)  03/12/2015 1130     Microbiology Recent Results (from the past 240 hour(s))  Respiratory Panel by RT PCR (Flu A&B, Covid) - Nasopharyngeal Swab     Status: None   Collection Time: 09/30/19  6:32 PM   Specimen: Nasopharyngeal Swab  Result Value Ref Range Status   SARS Coronavirus 2 by RT PCR NEGATIVE NEGATIVE Final    Comment: (NOTE) SARS-CoV-2 target nucleic acids are NOT DETECTED. The SARS-CoV-2 RNA is generally detectable in upper respiratoy specimens during the acute phase of infection. The lowest concentration of SARS-CoV-2 viral copies this assay can detect is 131 copies/mL. A negative result does not preclude SARS-Cov-2 infection and should not be used as the sole basis for treatment or other patient management decisions. A negative result may occur with  improper specimen collection/handling, submission of specimen other than nasopharyngeal swab, presence of viral mutation(s) within the areas targeted by this assay, and inadequate number of viral copies (<131 copies/mL). A negative result must be combined with clinical observations, patient history, and epidemiological information. The expected result is Negative. Fact Sheet for Patients:  https://www.moore.com/ Fact Sheet for Healthcare Providers:  https://www.young.biz/ This test is not yet ap proved or cleared by the [place] FDA and  has been authorized for detection and/or diagnosis of SARS-CoV-2 by FDA under an  Emergency Use Authorization (EUA). This EUA will remain  in effect (meaning this test can be used) for the duration of the COVID-19 declaration under Section 564(b)(1) of the Act, 21 U.S.C. section 360bbb-3(b)(1), unless the authorization is terminated or revoked sooner.    Influenza A by PCR NEGATIVE NEGATIVE Final   Influenza B by PCR NEGATIVE NEGATIVE Final    Comment: (NOTE) The Xpert Xpress SARS-CoV-2/FLU/RSV assay is intended as an aid in  the diagnosis of influenza from Nasopharyngeal swab specimens and  should not be used as a sole basis for treatment. Nasal washings and  aspirates are unacceptable for Xpert Xpress SARS-CoV-2/FLU/RSV  testing. Fact Sheet for Patients: https://www.moore.com/ Fact Sheet for Healthcare Providers: https://www.young.biz/ This test is not yet approved or cleared by the [place] FDA and  has been authorized for detection and/or diagnosis of SARS-CoV-2 by  FDA under an Emergency Use Authorization (EUA). This EUA will remain  in effect (meaning this test can be used) for the duration of the  Covid-19 declaration under Section 564(b)(1) of the Act, 21  U.S.C. section 360bbb-3(b)(1), unless the authorization is  terminated or revoked. Performed at [facility], [address] [place], [place] [address]   MRSA PCR Screening     Status: None   Collection Time: 09/30/19 11:19 PM   Specimen: Nasopharyngeal  Result Value Ref Range Status   MRSA by PCR NEGATIVE NEGATIVE Final    Comment:        The GeneXpert MRSA Assay (FDA approved for NASAL specimens only), is one component of a comprehensive MRSA colonization surveillance program. It is not intended to diagnose MRSA infection nor to guide or monitor treatment for MRSA infections. Performed at [facility], [address] [place], [place] [address]        Inpatient Medications:   Scheduled Meds: . apixaban  5 mg Oral BID  .  atenolol  25 mg Oral Daily  . calcium carbonate  2 tablet Oral Daily  . cholecalciferol  1,000 Units Oral Daily  . sodium chloride flush  3 mL Intravenous Q12H   Continuous Infusions: . sodium chloride    . diltiazem (CARDIZEM)  infusion 15 mg/hr (10/01/19 1148)     Radiological Exams on Admission: DG Chest Port 1 View  Result Date: 09/30/2019 CLINICAL DATA:  Dyspnea on exertion. EXAM: PORTABLE CHEST 1 VIEW COMPARISON:  August 25, 2019. FINDINGS: Stable cardiomediastinal silhouette. No pneumothorax is noted. Minimal bibasilar subsegmental atelectasis is noted. Small left pleural effusion is noted. Bony thorax is unremarkable. IMPRESSION: Minimal bibasilar subsegmental atelectasis. Small left pleural effusion. Electronically Signed   By: Lupita Raider M.D.   On: 09/30/2019 16:57    Impression/Recommendations Active Problems:   Insomnia   Persistent atrial fibrillation with rapid ventricular response (HCC)   Hyperthyroidism   Prediabetes  Hyperthyroidism: -Patient has symptoms of chronic diarrhea, unintentional weight loss of 10 pounds, insomnia, heat intolerance.  Never diagnosed with hyperthyroidism/hypothyroidism in the past. -Presented with A. fib with RVR.  Failed cardioversion in ED -On IV Cardizem drip as well as Eliquis.  Troponin: Negative x2. -Initial labs such as CBC, BMP, magnesium: WNL.  Reviewed chest x-ray.  BNP: 433.  Echo is ordered and is pending. -TSH: Less than 0.010 -Ordered total T3 and free T4 & TRAb -We will start on methimazole based on free T4 level--> discussed with pharmacy and started patient on 10 mg twice daily. -Continue atenolol 25 mg once daily -On telemetry.  Monitor heart rate closely. -Follow-up with PCP/endocrinology outpatient and repeat TSH in 6 to 8 weeks.  Persistent A. fib with RVR: -Continue Cardizem, Eliquis-as per cardiology.  Prediabetes: A1c 6.0 -Monitor blood sugar.  Insomnia/anxiety: -Continue temazepam, Ambien and Xanax as  needed  Osteoporosis: Continue calcium supplements  Thank you for this consultation.  Our [facility] hospitalist team will follow the patient with you.  Time Spent: 35 minutes   Ollen Bowl M.D. [facility] 10/01/2019, 1:27 PM

## 2019-10-01 NOTE — Discharge Instructions (Signed)

## 2019-10-01 NOTE — Progress Notes (Signed)
Progress Note  Patient Name: Lynn Hayden Date of Encounter: 10/01/2019  Primary Cardiologist:   Rollene Rotunda, MD   Subjective   She is still slightly SOB but has not been out of bed.  Denies pain.   Inpatient Medications    Scheduled Meds: . apixaban  5 mg Oral BID  . calcium carbonate  2 tablet Oral Daily  . cholecalciferol  1,000 Units Oral Daily  . sodium chloride flush  3 mL Intravenous Q12H   Continuous Infusions: . sodium chloride    . diltiazem (CARDIZEM) infusion 15 mg/hr (10/01/19 0400)   PRN Meds: sodium chloride, acetaminophen, ALPRAZolam, nitroGLYCERIN, ondansetron (ZOFRAN) IV, sodium chloride flush, temazepam, zolpidem   Vital Signs    Vitals:   09/30/19 2253 10/01/19 0023 10/01/19 0426 10/01/19 0439  BP: (!) 145/79  120/68   Pulse: (!) 135  94 (!) 114  Resp: (!) 23  (!) 33 (!) 25  Temp: 97.9 F (36.6 C) 98.3 F (36.8 C) 98.5 F (36.9 C)   TempSrc: Oral Oral Oral   SpO2:      Weight: 52 kg  52 kg   Height: 5\' 4"  (1.626 m)       Intake/Output Summary (Last 24 hours) at 10/01/2019 0719 Last data filed at 10/01/2019 0300 Gross per 24 hour  Intake 105.49 ml  Output --  Net 105.49 ml   Filed Weights   09/30/19 1640 09/30/19 2253 10/01/19 0426  Weight: 52.6 kg 52 kg 52 kg    Telemetry    Atrial fib with rapid rate.  - Personally Reviewed  ECG    NA - Personally Reviewed  Physical Exam   GEN: No acute distress.   Neck: No  JVD Cardiac: Irregular RR,  no murmurs, rubs, or gallops.  Respiratory:   Basilar crackles GI: Soft, nontender, non-distended  MS: No  edema; No deformity. Neuro:  Nonfocal  Psych: Normal affect   Labs    Chemistry Recent Labs  Lab 09/30/19 1715 09/30/19 1950  NA 139  --   K 3.5  --   CL 109  --   CO2 21*  --   GLUCOSE 109*  --   BUN 17  --   CREATININE 0.79  --   CALCIUM 9.1  --   PROT  --  5.9*  ALBUMIN  --  3.1*  AST  --  23  ALT  --  19  ALKPHOS  --  89  BILITOT  --  1.4*  GFRNONAA >60   --   GFRAA >60  --   ANIONGAP 9  --      Hematology Recent Labs  Lab 09/30/19 1715  WBC 5.5  RBC 4.42  HGB 12.2  HCT 37.9  MCV 85.7  MCH 27.6  MCHC 32.2  RDW 13.8  PLT 198    Cardiac EnzymesNo results for input(s): TROPONINI in the last 168 hours. No results for input(s): TROPIPOC in the last 168 hours.   BNP Recent Labs  Lab 09/30/19 1950  BNP 433.8*     DDimer No results for input(s): DDIMER in the last 168 hours.   Radiology    DG Chest Port 1 View  Result Date: 09/30/2019 CLINICAL DATA:  Dyspnea on exertion. EXAM: PORTABLE CHEST 1 VIEW COMPARISON:  August 25, 2019. FINDINGS: Stable cardiomediastinal silhouette. No pneumothorax is noted. Minimal bibasilar subsegmental atelectasis is noted. Small left pleural effusion is noted. Bony thorax is unremarkable. IMPRESSION: Minimal bibasilar subsegmental atelectasis. Small left pleural  effusion. Electronically Signed   By: Marijo Conception M.D.   On: 09/30/2019 16:57    Cardiac Studies   Echocardiogram 07/11/2019 1. Left ventricular ejection fraction, by visual estimation, is 55 to  60%. The left ventricle has normal function. Left ventricular septal wall  thickness was normal. Normal left ventricular posterior wall thickness.  There is no left ventricular  hypertrophy.  2. Left ventricular diastolic function could not be evaluated.  3. The left ventricle has no regional wall motion abnormalities.  4. No intracavitarty gradient noted at rest or with valsalva.  5. Global right ventricle has normal systolic function.The right  ventricular size is normal. No increase in right ventricular wall  thickness.  6. Left atrial size was moderately dilated.  7. Right atrial size was normal.  8. Trivial pericardial effusion is present.  9. The mitral valve is normal in structure. Mild mitral valve  regurgitation. No evidence of mitral stenosis.  10. The tricuspid valve is normal in structure.  11. The tricuspid valve is  normal in structure. Tricuspid valve  regurgitation is mild.  12. The aortic valve is tricuspid. Aortic valve regurgitation is not  visualized. No evidence of aortic valve sclerosis or stenosis.  13. Pulmonic regurgitation is mild.  14. The pulmonic valve was normal in structure. Pulmonic valve  regurgitation is mild.  15. Moderately elevated pulmonary artery systolic pressure.  16. The inferior vena cava is normal in size with greater than 50%  respiratory variability, suggesting right atrial pressure of 3 mmHg.   Patient Profile     79 y.o. female who presents for evaluation of atrial fib with RVR  Assessment & Plan    ATRIAL FIB WITH RVR:   Likely driven by hyperthyroidism.  Failed cardioversion in the ED.  Plan to add beta blocker and rate control and get [facility] to help with the thyroid issue.  I would like to avoid amiodarone and think rhythm control is less likely to work until thyroid is corrected.  Continue IV Dilt for now.    CHF:  I would like to check a limited echo as she has likely been in this rhythm for a couple of weeks and I would like to see that the EF is still OK.    DECREASED TSH:  Consulted [facility].  I appreciate their help in advance.    For questions or updates, please contact [place] Please consult [email or web address] for contact info under Cardiology/STEMI.   Signed, Minus Breeding, MD  10/01/2019, 7:19 AM

## 2019-10-01 NOTE — Plan of Care (Signed)
  Problem: Education: Goal: Knowledge of disease or condition will improve Outcome: Progressing Goal: Understanding of medication regimen will improve Outcome: Progressing   Problem: Activity: Goal: Ability to tolerate increased activity will improve Outcome: Progressing   

## 2019-10-02 DIAGNOSIS — I4891 Unspecified atrial fibrillation: Secondary | ICD-10-CM | POA: Diagnosis not present

## 2019-10-02 LAB — T3: T3, Total: 313 ng/dL — ABNORMAL HIGH (ref 71–180)

## 2019-10-02 MED ORDER — FUROSEMIDE 20 MG PO TABS
20.0000 mg | ORAL_TABLET | Freq: Once | ORAL | Status: AC
  Administered 2019-10-02: 20 mg via ORAL
  Filled 2019-10-02: qty 1

## 2019-10-02 MED ORDER — ATENOLOL 25 MG PO TABS
25.0000 mg | ORAL_TABLET | Freq: Two times a day (BID) | ORAL | Status: DC
  Administered 2019-10-02 – 2019-10-04 (×5): 25 mg via ORAL
  Filled 2019-10-02 (×6): qty 1

## 2019-10-02 NOTE — Plan of Care (Signed)

## 2019-10-02 NOTE — Plan of Care (Signed)
  Problem: Education: Goal: Knowledge of disease or condition will improve 10/02/2019 1041 by Luna Kitchens, RN Outcome: Progressing 10/02/2019 1039 by Luna Kitchens, RN Outcome: Progressing Goal: Understanding of medication regimen will improve 10/02/2019 1041 by Luna Kitchens, RN Outcome: Progressing 10/02/2019 1039 by Luna Kitchens, RN Outcome: Progressing Goal: Individualized Educational Video(s) 10/02/2019 1041 by Luna Kitchens, RN Outcome: Progressing 10/02/2019 1039 by Luna Kitchens, RN Outcome: Progressing   Problem: Cardiac: Goal: Ability to achieve and maintain adequate cardiopulmonary perfusion will improve 10/02/2019 1041 by Luna Kitchens, RN Outcome: Progressing 10/02/2019 1039 by Luna Kitchens, RN Outcome: Progressing   Problem: Activity: Goal: Ability to tolerate increased activity will improve 10/02/2019 1041 by Luna Kitchens, RN Outcome: Progressing 10/02/2019 1039 by Luna Kitchens, RN Outcome: Progressing   Problem: Health Behavior/Discharge Planning: Goal: Ability to safely manage health-related needs after discharge will improve 10/02/2019 1041 by Luna Kitchens, RN Outcome: Progressing 10/02/2019 1039 by Luna Kitchens, RN Outcome: Progressing   Problem: Education: Goal: Knowledge of General Education information will improve Description: Including pain rating scale, medication(s)/side effects and non-pharmacologic comfort measures Outcome: Progressing   Problem: Health Behavior/Discharge Planning: Goal: Ability to manage health-related needs will improve Outcome: Progressing   Problem: Clinical Measurements: Goal: Ability to maintain clinical measurements within normal limits will improve Outcome: Progressing Goal: Will remain free from infection Outcome: Progressing Goal: Diagnostic test results will improve Outcome: Progressing Goal: Respiratory complications will improve Outcome: Progressing Goal: Cardiovascular complication will be avoided Outcome:  Progressing   Problem: Activity: Goal: Risk for activity intolerance will decrease Outcome: Progressing   Problem: Nutrition: Goal: Adequate nutrition will be maintained Outcome: Progressing   Problem: Coping: Goal: Level of anxiety will decrease Outcome: Progressing   Problem: Elimination: Goal: Will not experience complications related to bowel motility Outcome: Progressing Goal: Will not experience complications related to urinary retention Outcome: Progressing   Problem: Pain Managment: Goal: General experience of comfort will improve Outcome: Progressing   Problem: Safety: Goal: Ability to remain free from injury will improve Outcome: Progressing   Problem: Skin Integrity: Goal: Risk for impaired skin integrity will decrease Outcome: Progressing

## 2019-10-02 NOTE — Progress Notes (Signed)
Progress Note  Patient Name: Lynn Hayden Date of Encounter: 10/02/2019  Primary Cardiologist:   Rollene Rotunda, MD   Subjective   Breathing OK but has not walked around  Inpatient Medications    Scheduled Meds:  apixaban  5 mg Oral BID   atenolol  25 mg Oral Daily   calcium carbonate  2 tablet Oral Daily   cholecalciferol  1,000 Units Oral Daily   methimazole  10 mg Oral BID   sodium chloride flush  3 mL Intravenous Q12H   Continuous Infusions:  sodium chloride     diltiazem (CARDIZEM) infusion 5 mg/hr (10/02/19 0600)   PRN Meds: sodium chloride, acetaminophen, ALPRAZolam, loperamide, nitroGLYCERIN, ondansetron (ZOFRAN) IV, ondansetron, sodium chloride flush, temazepam, zolpidem   Vital Signs    Vitals:   10/02/19 0300 10/02/19 0330 10/02/19 0456 10/02/19 0749  BP: 95/73 103/68  109/64  Pulse: 69 69  80  Resp: 20 (!) 22  18  Temp: 97.9 F (36.6 C)   97.9 F (36.6 C)  TempSrc: Oral   Oral  SpO2: 91% 93%  91%  Weight:   52.5 kg   Height:        Intake/Output Summary (Last 24 hours) at 10/02/2019 0751 Last data filed at 10/02/2019 0600 Gross per 24 hour  Intake 636.7 ml  Output --  Net 636.7 ml   Filed Weights   09/30/19 2253 10/01/19 0426 10/02/19 0456  Weight: 52 kg 52 kg 52.5 kg    Telemetry    Atrial fib with RVR.  - Personally Reviewed  ECG    NA - Personally Reviewed  Physical Exam   GEN: No  acute distress.   Neck: No  JVD Cardiac: Irregular RR, no murmurs, rubs, or gallops.  Respiratory: Clear   to auscultation bilaterally. GI: Soft, nontender, non-distended, normal bowel sounds  MS:  No edema; No deformity. Neuro:   Nonfocal  Psych: Oriented and appropriate    Labs    Chemistry Recent Labs  Lab 09/30/19 1715 09/30/19 1950 10/01/19 0804  NA 139  --  139  K 3.5  --  3.8  CL 109  --  109  CO2 21*  --  21*  GLUCOSE 109*  --  194*  BUN 17  --  14  CREATININE 0.79  --  0.74  CALCIUM 9.1  --  9.0  PROT  --  5.9*   --   ALBUMIN  --  3.1*  --   AST  --  23  --   ALT  --  19  --   ALKPHOS  --  89  --   BILITOT  --  1.4*  --   GFRNONAA >60  --  >60  GFRAA >60  --  >60  ANIONGAP 9  --  9     Hematology Recent Labs  Lab 09/30/19 1715  WBC 5.5  RBC 4.42  HGB 12.2  HCT 37.9  MCV 85.7  MCH 27.6  MCHC 32.2  RDW 13.8  PLT 198    Cardiac EnzymesNo results for input(s): TROPONINI in the last 168 hours. No results for input(s): TROPIPOC in the last 168 hours.   BNP Recent Labs  Lab 09/30/19 1950  BNP 433.8*     DDimer No results for input(s): DDIMER in the last 168 hours.   Radiology    DG Chest Port 1 View  Result Date: 09/30/2019 CLINICAL DATA:  Dyspnea on exertion. EXAM: PORTABLE CHEST 1 VIEW COMPARISON:  August 25, 2019. FINDINGS: Stable cardiomediastinal silhouette. No pneumothorax is noted. Minimal bibasilar subsegmental atelectasis is noted. Small left pleural effusion is noted. Bony thorax is unremarkable. IMPRESSION: Minimal bibasilar subsegmental atelectasis. Small left pleural effusion. Electronically Signed   By: Lupita Raider M.D.   On: 09/30/2019 16:57   ECHOCARDIOGRAM LIMITED  Result Date: 10/01/2019    ECHOCARDIOGRAM REPORT   Patient Name:   Lynn Hayden Date of Exam: 10/01/2019 Medical Rec #:  [id number]      Height:       64.0 in Accession #:    [id number]     Weight:       114.6 lb Date of Birth:  03-07-1941      BSA:          1.544 m Patient Age:    79 years       BP:           111/69 mmHg Patient Gender: F              HR:           82 bpm. Exam Location:  Inpatient Procedure: Limited Color Doppler, Cardiac Doppler and Limited Echo Indications:    dyspnea 786.09  History:        Patient has prior history of Echocardiogram examinations, most                 recent 07/11/2019. Arrythmias:Atrial Fibrillation;                 Signs/Symptoms:Murmur.  Sonographer:    Delcie Roch Referring Phys: [id number] Beatris Belen IMPRESSIONS  1. Left ventricular ejection fraction, by  estimation, is 60 to 65%. The left ventricle has normal function. The left ventricle has no regional wall motion abnormalities. Unable to assess LV diastolic filling due to underlying atrial flutter.  2. Right ventricular systolic function is mildly reduced. The right ventricular size is mildly enlarged. There is moderately elevated pulmonary artery systolic pressure. The estimated right ventricular systolic pressure is 61.3 mmHg.  3. Left atrial size was severely dilated.  4. Right atrial size was severely dilated.  5. The mitral valve is normal in structure. Mild to moderate mitral valve regurgitation. No evidence of mitral stenosis.  6. Tricuspid valve regurgitation is moderate to severe.  7. The aortic valve is tricuspid. Aortic valve regurgitation is mild. Mild to moderate aortic valve sclerosis/calcification is present, without any evidence of aortic stenosis.  8. The inferior vena cava is dilated in size with >50% respiratory variability, suggesting right atrial pressure of 8 mmHg. FINDINGS  Left Ventricle: Left ventricular ejection fraction, by estimation, is 60 to 65%. The left ventricle has normal function. The left ventricle has no regional wall motion abnormalities. The left ventricular internal cavity size was normal in size. There is  no left ventricular hypertrophy. Unable to assess LV diastolic filling due to underlying atrial flutter. Right Ventricle: The right ventricular size is mildly enlarged. No increase in right ventricular wall thickness. Right ventricular systolic function is mildly reduced. There is moderately elevated pulmonary artery systolic pressure. The tricuspid regurgitant velocity is 3.65 m/s, and with an assumed right atrial pressure of 8 mmHg, the estimated right ventricular systolic pressure is 61.3 mmHg. Left Atrium: Left atrial size was severely dilated. Right Atrium: Right atrial size was severely dilated. Pericardium: There is no evidence of pericardial effusion. Mitral  Valve: The mitral valve is normal in structure. There is mild thickening of the mitral valve leaflet(s). Normal  mobility of the mitral valve leaflets. Mild to moderate mitral valve regurgitation. No evidence of mitral valve stenosis. Tricuspid Valve: The tricuspid valve is normal in structure. Tricuspid valve regurgitation is moderate to severe. No evidence of tricuspid stenosis. Aortic Valve: The aortic valve is tricuspid. Aortic valve regurgitation is mild. Mild to moderate aortic valve sclerosis/calcification is present, without any evidence of aortic stenosis. Pulmonic Valve: The pulmonic valve was normal in structure. Pulmonic valve regurgitation is trivial. No evidence of pulmonic stenosis. Aorta: The aortic root is normal in size and structure. Venous: The inferior vena cava is dilated in size with greater than 50% respiratory variability, suggesting right atrial pressure of 8 mmHg. IAS/Shunts: No atrial level shunt detected by color flow Doppler.  LEFT VENTRICLE PLAX 2D LVIDd:         3.60 cm LVIDs:         2.80 cm LV PW:         0.80 cm LV IVS:        0.80 cm LVOT diam:     1.60 cm LVOT Area:     2.01 cm  LEFT ATRIUM         Index LA diam:    4.00 cm 2.59 cm/m   AORTA Ao Root diam: 2.50 cm Ao Asc diam:  2.90 cm TRICUSPID VALVE TR Peak grad:   53.3 mmHg TR Vmax:        365.00 cm/s  SHUNTS Systemic Diam: 1.60 cm Armanda Magic MD Electronically signed by Armanda Magic MD Signature Date/Time: 10/01/2019/3:56:36 PM    Final     Cardiac Studies   Echocardiogram 09/30/19 1. Left ventricular ejection fraction, by estimation, is 60 to 65%. The  left ventricle has normal function. The left ventricle has no regional  wall motion abnormalities. Unable to assess LV diastolic filling due to  underlying atrial flutter.  2. Right ventricular systolic function is mildly reduced. The right  ventricular size is mildly enlarged. There is moderately elevated  pulmonary artery systolic pressure. The estimated right  ventricular  systolic pressure is 61.3 mmHg.  3. Left atrial size was severely dilated.  4. Right atrial size was severely dilated.  5. The mitral valve is normal in structure. Mild to moderate mitral valve  regurgitation. No evidence of mitral stenosis.  6. Tricuspid valve regurgitation is moderate to severe.  7. The aortic valve is tricuspid. Aortic valve regurgitation is mild.  Mild to moderate aortic valve sclerosis/calcification is present, without  any evidence of aortic stenosis.  8. The inferior vena cava is dilated in size with >50% respiratory  variability, suggesting right atrial pressure of 8 mmHg.  Patient Profile     79 y.o. female who presents for evaluation of atrial fib with RVR  Assessment & Plan    ATRIAL FIB WITH RVR:   Likely driven by hyperthyroidism.  Failed cardioversion in the ED.  Converted to NSR but went back into atrial fib.  I added atenolol yesterday.   On Eliquis.  I will increase the Atenolol today and stop the Cardizem IV.  She needs to ambulate off of O2 to watch HR and sats.  I suspect with will go in and out of fib and rate will not be perfect until weeks from now when she is more likely to maintain NSR as her thyroid corrects.    CHF:  EF is preserved.   I am going to give one dose of PO Lasix.  Check BMET in AM.  DECREASED TSH:    Started on methimazole per [facility].  I appreciate there help.     For questions or updates, please contact [place] Please consult [email or web address] for contact info under Cardiology/STEMI.   Signed, Minus Breeding, MD  10/02/2019, 7:51 AM

## 2019-10-02 NOTE — Progress Notes (Signed)
Patient back in Afib RVR confirmed by 12 lead EKG.  HR 100-110s.  Cardizem gtt restarted. Dr. Okey Dupre notified via text page.

## 2019-10-03 DIAGNOSIS — I4819 Other persistent atrial fibrillation: Principal | ICD-10-CM

## 2019-10-03 LAB — BASIC METABOLIC PANEL
Anion gap: 10 (ref 5–15)
BUN: 30 mg/dL — ABNORMAL HIGH (ref 8–23)
CO2: 20 mmol/L — ABNORMAL LOW (ref 22–32)
Calcium: 8.7 mg/dL — ABNORMAL LOW (ref 8.9–10.3)
Chloride: 105 mmol/L (ref 98–111)
Creatinine, Ser: 1.04 mg/dL — ABNORMAL HIGH (ref 0.44–1.00)
GFR calc Af Amer: 59 mL/min — ABNORMAL LOW (ref >60)
GFR calc non Af Amer: 51 mL/min — ABNORMAL LOW (ref >60)
Glucose, Bld: 103 mg/dL — ABNORMAL HIGH (ref 70–99)
Potassium: 4 mmol/L (ref 3.5–5.1)
Sodium: 135 mmol/L (ref 135–145)

## 2019-10-03 LAB — THYROID STIMULATING IMMUNOGLOBULIN: Thyroid Stimulating Immunoglob: 7.24 IU/L — ABNORMAL HIGH (ref 0.00–0.55)

## 2019-10-03 MED ORDER — DIGOXIN 0.25 MG/ML IJ SOLN
0.2500 mg | Freq: Once | INTRAMUSCULAR | Status: AC
  Administered 2019-10-03: 10:00:00 0.25 mg via INTRAVENOUS
  Filled 2019-10-03: qty 2

## 2019-10-03 MED ORDER — FUROSEMIDE 10 MG/ML IJ SOLN
40.0000 mg | Freq: Two times a day (BID) | INTRAMUSCULAR | Status: AC
  Administered 2019-10-03 (×2): 40 mg via INTRAVENOUS
  Filled 2019-10-03 (×2): qty 4

## 2019-10-03 MED ORDER — DIGOXIN 125 MCG PO TABS: 0.1250 mg | ORAL_TABLET | Freq: Every day | ORAL | Status: DC

## 2019-10-03 MED ORDER — FUROSEMIDE 10 MG/ML IJ SOLN: 40.0000 mg | Freq: Two times a day (BID) | INTRAMUSCULAR | Status: DC

## 2019-10-03 MED ORDER — DIGOXIN 125 MCG PO TABS
0.1250 mg | ORAL_TABLET | Freq: Every day | ORAL | Status: DC
  Administered 2019-10-04 – 2019-10-06 (×3): 0.125 mg via ORAL
  Filled 2019-10-03 (×3): qty 1

## 2019-10-03 NOTE — Progress Notes (Signed)
Progress Note  Patient Name: Lynn Hayden Date of Encounter: 10/03/2019  Primary Cardiologist:   Rollene Rotunda, MD   Subjective   SOB ambulating and her sats are in the low 90s.  Inpatient Medications    Scheduled Meds: . apixaban  5 mg Oral BID  . atenolol  25 mg Oral BID  . calcium carbonate  2 tablet Oral Daily  . cholecalciferol  1,000 Units Oral Daily  . methimazole  10 mg Oral BID  . sodium chloride flush  3 mL Intravenous Q12H   Continuous Infusions: . sodium chloride    . diltiazem (CARDIZEM) infusion 5 mg/hr (10/03/19 0000)   PRN Meds: sodium chloride, acetaminophen, ALPRAZolam, loperamide, nitroGLYCERIN, ondansetron (ZOFRAN) IV, ondansetron, sodium chloride flush, temazepam, zolpidem   Vital Signs    Vitals:   10/03/19 0000 10/03/19 0002 10/03/19 0335 10/03/19 0729  BP: 108/66  112/76 122/70  Pulse: (!) 104  79 100  Resp: 20 20 (!) 23 (!) 23  Temp: 98.3 F (36.8 C)   98 F (36.7 C)  TempSrc: Oral   Oral  SpO2: 90% 90% 90%   Weight:   53 kg   Height:        Intake/Output Summary (Last 24 hours) at 10/03/2019 0756 Last data filed at 10/03/2019 0000 Gross per 24 hour  Intake 329.58 ml  Output --  Net 329.58 ml   Filed Weights   10/01/19 0426 10/02/19 0456 10/03/19 0335  Weight: 52 kg 52.5 kg 53 kg    Telemetry    Atrial fib with rapid rate still - Personally Reviewed  ECG    Atrial fib/flutter with variable conduction rate 112.  - Personally Reviewed  Physical Exam   GEN: No acute distress.   Neck: No  JVD Cardiac: Irregular RR, no murmurs, rubs, or gallops.  Respiratory:    Decreased breath sounds with bilateral crackles GI: Soft, nontender, non-distended  MS:    Mild edema; No deformity. Neuro:  Nonfocal  Psych: Normal affect   Labs    Chemistry Recent Labs  Lab 09/30/19 1715 09/30/19 1950 10/01/19 0804 10/03/19 0156  NA 139  --  139 135  K 3.5  --  3.8 4.0  CL 109  --  109 105  CO2 21*  --  21* 20*  GLUCOSE 109*   --  194* 103*  BUN 17  --  14 30*  CREATININE 0.79  --  0.74 1.04*  CALCIUM 9.1  --  9.0 8.7*  PROT  --  5.9*  --   --   ALBUMIN  --  3.1*  --   --   AST  --  23  --   --   ALT  --  19  --   --   ALKPHOS  --  89  --   --   BILITOT  --  1.4*  --   --   GFRNONAA >60  --  >60 51*  GFRAA >60  --  >60 59*  ANIONGAP 9  --  9 10     Hematology Recent Labs  Lab 09/30/19 1715  WBC 5.5  RBC 4.42  HGB 12.2  HCT 37.9  MCV 85.7  MCH 27.6  MCHC 32.2  RDW 13.8  PLT 198    Cardiac EnzymesNo results for input(s): TROPONINI in the last 168 hours. No results for input(s): TROPIPOC in the last 168 hours.   BNP Recent Labs  Lab 09/30/19 1950  BNP 433.8*  DDimer No results for input(s): DDIMER in the last 168 hours.   Radiology    ECHOCARDIOGRAM LIMITED  Result Date: 10/01/2019    ECHOCARDIOGRAM REPORT   Patient Name:   Lynn Hayden Date of Exam: 10/01/2019 Medical Rec #:  [id number]      Height:       64.0 in Accession #:    [id number]     Weight:       114.6 lb Date of Birth:  Sep 03, 1940      BSA:          1.544 m Patient Age:    79 years       BP:           111/69 mmHg Patient Gender: F              HR:           82 bpm. Exam Location:  Inpatient Procedure: Limited Color Doppler, Cardiac Doppler and Limited Echo Indications:    dyspnea 786.09  History:        Patient has prior history of Echocardiogram examinations, most                 recent 07/11/2019. Arrythmias:Atrial Fibrillation;                 Signs/Symptoms:Murmur.  Sonographer:    Johny Chess Referring Phys: [place]  1. Left ventricular ejection fraction, by estimation, is 60 to 65%. The left ventricle has normal function. The left ventricle has no regional wall motion abnormalities. Unable to assess LV diastolic filling due to underlying atrial flutter.  2. Right ventricular systolic function is mildly reduced. The right ventricular size is mildly enlarged. There is moderately elevated pulmonary  artery systolic pressure. The estimated right ventricular systolic pressure is 51.0 mmHg.  3. Left atrial size was severely dilated.  4. Right atrial size was severely dilated.  5. The mitral valve is normal in structure. Mild to moderate mitral valve regurgitation. No evidence of mitral stenosis.  6. Tricuspid valve regurgitation is moderate to severe.  7. The aortic valve is tricuspid. Aortic valve regurgitation is mild. Mild to moderate aortic valve sclerosis/calcification is present, without any evidence of aortic stenosis.  8. The inferior vena cava is dilated in size with >50% respiratory variability, suggesting right atrial pressure of 8 mmHg. FINDINGS  Left Ventricle: Left ventricular ejection fraction, by estimation, is 60 to 65%. The left ventricle has normal function. The left ventricle has no regional wall motion abnormalities. The left ventricular internal cavity size was normal in size. There is  no left ventricular hypertrophy. Unable to assess LV diastolic filling due to underlying atrial flutter. Right Ventricle: The right ventricular size is mildly enlarged. No increase in right ventricular wall thickness. Right ventricular systolic function is mildly reduced. There is moderately elevated pulmonary artery systolic pressure. The tricuspid regurgitant velocity is 3.65 m/s, and with an assumed right atrial pressure of 8 mmHg, the estimated right ventricular systolic pressure is 25.8 mmHg. Left Atrium: Left atrial size was severely dilated. Right Atrium: Right atrial size was severely dilated. Pericardium: There is no evidence of pericardial effusion. Mitral Valve: The mitral valve is normal in structure. There is mild thickening of the mitral valve leaflet(s). Normal mobility of the mitral valve leaflets. Mild to moderate mitral valve regurgitation. No evidence of mitral valve stenosis. Tricuspid Valve: The tricuspid valve is normal in structure. Tricuspid valve regurgitation is moderate to severe. No  evidence  of tricuspid stenosis. Aortic Valve: The aortic valve is tricuspid. Aortic valve regurgitation is mild. Mild to moderate aortic valve sclerosis/calcification is present, without any evidence of aortic stenosis. Pulmonic Valve: The pulmonic valve was normal in structure. Pulmonic valve regurgitation is trivial. No evidence of pulmonic stenosis. Aorta: The aortic root is normal in size and structure. Venous: The inferior vena cava is dilated in size with greater than 50% respiratory variability, suggesting right atrial pressure of 8 mmHg. IAS/Shunts: No atrial level shunt detected by color flow Doppler.  LEFT VENTRICLE PLAX 2D LVIDd:         3.60 cm LVIDs:         2.80 cm LV PW:         0.80 cm LV IVS:        0.80 cm LVOT diam:     1.60 cm LVOT Area:     2.01 cm  LEFT ATRIUM         Index LA diam:    4.00 cm 2.59 cm/m   AORTA Ao Root diam: 2.50 cm Ao Asc diam:  2.90 cm TRICUSPID VALVE TR Peak grad:   53.3 mmHg TR Vmax:        365.00 cm/s  SHUNTS Systemic Diam: 1.60 cm Armanda Magic MD Electronically signed by Armanda Magic MD Signature Date/Time: 10/01/2019/3:56:36 PM    Final     Cardiac Studies   Echocardiogram 09/30/19 1. Left ventricular ejection fraction, by estimation, is 60 to 65%. The  left ventricle has normal function. The left ventricle has no regional  wall motion abnormalities. Unable to assess LV diastolic filling due to  underlying atrial flutter.  2. Right ventricular systolic function is mildly reduced. The right  ventricular size is mildly enlarged. There is moderately elevated  pulmonary artery systolic pressure. The estimated right ventricular  systolic pressure is 61.3 mmHg.  3. Left atrial size was severely dilated.  4. Right atrial size was severely dilated.  5. The mitral valve is normal in structure. Mild to moderate mitral valve  regurgitation. No evidence of mitral stenosis.  6. Tricuspid valve regurgitation is moderate to severe.  7. The aortic valve is  tricuspid. Aortic valve regurgitation is mild.  Mild to moderate aortic valve sclerosis/calcification is present, without  any evidence of aortic stenosis.  8. The inferior vena cava is dilated in size with >50% respiratory  variability, suggesting right atrial pressure of 8 mmHg.  Patient Profile     79 y.o. female who presents for evaluation of atrial fib with RVR  Assessment & Plan      ATRIAL FIB WITH RVR:    Rate still increased.  BP is low.  I will add dig.  She is on atenolol.  This is driven by her hyperthyroidism.  We will try to get off the Dilt.  If she is still rapid on Monday we need to try DCCV again.  She has been on anticoagulatin.   CHF:  EF is preserved.  Increase Lasix today.  I gave two doses only of IV.  BMET ordered for the AM.  Need to decide on dose in the AM.   DECREASED TSH:    Started on methimazole per [facility].    For questions or updates, please contact [facility] Please consult [email or web address] for contact info under Cardiology/STEMI.   Signed, Rollene Rotunda, MD  10/03/2019, 7:56 AM

## 2019-10-04 LAB — BASIC METABOLIC PANEL
Anion gap: 11 (ref 5–15)
BUN: 27 mg/dL — ABNORMAL HIGH (ref 8–23)
CO2: 23 mmol/L (ref 22–32)
Calcium: 8.5 mg/dL — ABNORMAL LOW (ref 8.9–10.3)
Chloride: 103 mmol/L (ref 98–111)
Creatinine, Ser: 0.82 mg/dL (ref 0.44–1.00)
GFR calc Af Amer: >60 mL/min (ref >60)
GFR calc non Af Amer: >60 mL/min (ref >60)
Glucose, Bld: 94 mg/dL (ref 70–99)
Potassium: 3.2 mmol/L — ABNORMAL LOW (ref 3.5–5.1)
Sodium: 137 mmol/L (ref 135–145)

## 2019-10-04 MED ORDER — POTASSIUM IODIDE (EXPECTORANT) 1 GM/ML PO SOLN
150.0000 mg | Freq: Two times a day (BID) | ORAL | Status: DC
  Administered 2019-10-04 – 2019-10-05 (×2): 150 mg via ORAL
  Filled 2019-10-04 (×3): qty 30

## 2019-10-04 MED ORDER — POTASSIUM CHLORIDE CRYS ER 20 MEQ PO TBCR
40.0000 meq | EXTENDED_RELEASE_TABLET | Freq: Once | ORAL | Status: AC
  Administered 2019-10-04: 40 meq via ORAL
  Filled 2019-10-04: qty 2

## 2019-10-04 MED ORDER — FUROSEMIDE 40 MG PO TABS
40.0000 mg | ORAL_TABLET | Freq: Every day | ORAL | Status: DC
  Administered 2019-10-04 – 2019-10-05 (×2): 40 mg via ORAL
  Filled 2019-10-04 (×4): qty 1

## 2019-10-04 MED ORDER — POTASSIUM IODIDE (EXPECTORANT) 1 GM/ML PO SOLN
150.0000 mg | Freq: Two times a day (BID) | ORAL | Status: DC
  Filled 2019-10-04: qty 30

## 2019-10-04 MED ORDER — ATENOLOL 25 MG PO TABS
37.5000 mg | ORAL_TABLET | Freq: Two times a day (BID) | ORAL | Status: DC
  Administered 2019-10-04 – 2019-10-06 (×4): 37.5 mg via ORAL
  Filled 2019-10-04 (×4): qty 1.5

## 2019-10-04 MED ORDER — ATENOLOL 25 MG PO TABS
12.5000 mg | ORAL_TABLET | Freq: Once | ORAL | Status: AC
  Administered 2019-10-04: 12.5 mg via ORAL
  Filled 2019-10-04 (×2): qty 0.5

## 2019-10-04 MED ORDER — DILTIAZEM HCL 25 MG/5ML IV SOLN
10.0000 mg | Freq: Once | INTRAVENOUS | Status: AC
  Administered 2019-10-04: 10 mg via INTRAVENOUS
  Filled 2019-10-04: qty 5

## 2019-10-04 MED ORDER — DILTIAZEM HCL-DEXTROSE 125-5 MG/125ML-% IV SOLN (PREMIX)
5.0000 mg/h | INTRAVENOUS | Status: DC
  Filled 2019-10-04: qty 125

## 2019-10-04 NOTE — Progress Notes (Signed)
Progress Note  Patient Name: Lynn Hayden Date of Encounter: 10/04/2019  Primary Cardiologist:   Minus Breeding, MD   Subjective   She says that she is breathing better.  She is fatigued.   Inpatient Medications    Scheduled Meds: . apixaban  5 mg Oral BID  . atenolol  25 mg Oral BID  . calcium carbonate  2 tablet Oral Daily  . cholecalciferol  1,000 Units Oral Daily  . digoxin  0.125 mg Oral Daily  . methimazole  10 mg Oral BID  . potassium chloride  40 mEq Oral Once  . potassium iodide  150 mg Oral Q12H  . sodium chloride flush  3 mL Intravenous Q12H   Continuous Infusions: . sodium chloride    . diltiazem (CARDIZEM) infusion 5 mg/hr (10/04/19 0718)   PRN Meds: sodium chloride, acetaminophen, ALPRAZolam, loperamide, nitroGLYCERIN, ondansetron (ZOFRAN) IV, ondansetron, sodium chloride flush, temazepam, zolpidem   Vital Signs    Vitals:   10/04/19 0000 10/04/19 0200 10/04/19 0425 10/04/19 0754  BP:   115/61 (!) 101/58  Pulse: 91 85 90 73  Resp: 19 20 20 20   Temp:   97.9 F (36.6 C) 97.7 F (36.5 C)  TempSrc:   Oral Oral  SpO2: 90% 93% 90% 90%  Weight:   50.2 kg   Height:        Intake/Output Summary (Last 24 hours) at 10/04/2019 1007 Last data filed at 10/04/2019 0800 Gross per 24 hour  Intake 363 ml  Output 400 ml  Net -37 ml   Filed Weights   10/02/19 0456 10/03/19 0335 10/04/19 0425  Weight: 52.5 kg 53 kg 50.2 kg    Telemetry    Atrial fib with rate improved.  - Personally Reviewed  ECG    NA  - Personally Reviewed  Physical Exam   GEN: No  acute distress.   Neck: No  JVD Cardiac: Irregular RR, no murmurs, rubs, or gallops.  Respiratory: Clear  to auscultation bilaterally. GI: Soft, nontender, non-distended, normal bowel sounds  MS:  No edema; No deformity. Neuro:   Nonfocal  Psych: Oriented and appropriate    Labs    Chemistry Recent Labs  Lab 09/30/19 1715 09/30/19 1950 10/01/19 0804 10/03/19 0156 10/04/19 0143  NA   < >   --  139 135 137  K   < >  --  3.8 4.0 3.2*  CL   < >  --  109 105 103  CO2   < >  --  21* 20* 23  GLUCOSE   < >  --  194* 103* 94  BUN   < >  --  14 30* 27*  CREATININE   < >  --  0.74 1.04* 0.82  CALCIUM   < >  --  9.0 8.7* 8.5*  PROT  --  5.9*  --   --   --   ALBUMIN  --  3.1*  --   --   --   AST  --  23  --   --   --   ALT  --  19  --   --   --   ALKPHOS  --  89  --   --   --   BILITOT  --  1.4*  --   --   --   GFRNONAA   < >  --  >60 51* >60  GFRAA   < >  --  >60 59* >60  ANIONGAP   < >  --  9 10 11    < > = values in this interval not displayed.     Hematology Recent Labs  Lab 09/30/19 1715  WBC 5.5  RBC 4.42  HGB 12.2  HCT 37.9  MCV 85.7  MCH 27.6  MCHC 32.2  RDW 13.8  PLT 198    Cardiac EnzymesNo results for input(s): TROPONINI in the last 168 hours. No results for input(s): TROPIPOC in the last 168 hours.   BNP Recent Labs  Lab 09/30/19 1950  BNP 433.8*     DDimer No results for input(s): DDIMER in the last 168 hours.   Radiology    No results found.  Cardiac Studies   Echocardiogram 09/30/19 1. Left ventricular ejection fraction, by estimation, is 60 to 65%. The  left ventricle has normal function. The left ventricle has no regional  wall motion abnormalities. Unable to assess LV diastolic filling due to  underlying atrial flutter.  2. Right ventricular systolic function is mildly reduced. The right  ventricular size is mildly enlarged. There is moderately elevated  pulmonary artery systolic pressure. The estimated right ventricular  systolic pressure is 61.3 mmHg.  3. Left atrial size was severely dilated.  4. Right atrial size was severely dilated.  5. The mitral valve is normal in structure. Mild to moderate mitral valve  regurgitation. No evidence of mitral stenosis.  6. Tricuspid valve regurgitation is moderate to severe.  7. The aortic valve is tricuspid. Aortic valve regurgitation is mild.  Mild to moderate aortic valve  sclerosis/calcification is present, without  any evidence of aortic stenosis.  8. The inferior vena cava is dilated in size with >50% respiratory  variability, suggesting right atrial pressure of 8 mmHg.  Patient Profile     79 y.o. female who presents for evaluation of atrial fib with RVR  Assessment & Plan      ATRIAL FIB WITH RVR:    Rate still increased but better.  BP is low.  I added dig.  She is on atenolol.  Possible DCCV on Monday.   I will try to stop the Cardizem IV.    CHF:  EF is preserved.  Increase Lasix yesterday. Breathing is improved and sats better.  Will give PO Lasix today.   HYPOKALEMIA:  Supplement and repeat BMET in the AM.     DECREASED TSH:     This is being managed by [facility].   For questions or updates, please contact [facility] Please consult [email or web address] for contact info under Cardiology/STEMI.   Signed, Tuesday, MD  10/04/2019, 10:07 AM

## 2019-10-04 NOTE — Progress Notes (Signed)
Pt's HR is up to 140's, non-sustained, pt is asymptomatic, multifocal PVC's, Paged cardiology PA, waiting for the response  Lonia Farber., RN

## 2019-10-04 NOTE — Progress Notes (Signed)
Called regarding increased heart rate through the day.  Reviewed in [employer] regarding the BB of choice, and was surprised to learn ( but not really cause [facility] had chosen it) that atenolol was the first drug of choice  BUT looking back at the literature [facility] Clin Proc . 2019 Jun;94(6):1048-1064. doi: 10.1016/j.mayocp.2018.10.011.) all I could really find, suggested that propranolol as a nonselective BB might be preferred b/c it prevents peripheral conversion of T4>>T3 Could not get access to this paper    Switching Between ?-Blockers: An Empiric Tool for the Cardiovascular Practitioner Lorin Picket.RostonMD, FRCPCabDosonChuaPharmDaElaineLumPharmDaAndrew D.KrahnMD, FRCPCa [email or web address]  For now iwll increase atenolol as HR increasing with d/c of dilt earlier today

## 2019-10-04 NOTE — Progress Notes (Signed)
CONSULTATION PROGRESS NOTE  Lynn Hayden KAJ:[id number] DOB: Mar 11, 1941 DOA: 09/30/2019 PCP: No primary care provider on file.  HPI/Recap of past 24 hours:  Less tachycardic, sitting up in chair, denies chest pain, no short of breath, no edema   Assessment/Plan: Active Problems:   Insomnia   Persistent atrial fibrillation with rapid ventricular response (HCC)   Hyperthyroidism   Prediabetes  A. fib RVR; management for cardiology  Hypothyroidism Suppressed TSH less than 0.01, with elevated T3 313 Free T4 3.62, positive thyroid-stimulating hemoglobin at 7.24 Case discussed with endocrinologist Dr. Darnell Level who agree with methimazole 10 twice daily, also recommended SSKI on Saturday and Sunday, repeat T3 and T4 on Monday, follow-up with her in 4 weeks  Hypokalemia Replace K  DVT Prophylaxis:On Eliquis  Code Status: Full  Family Communication: patient   Disposition Plan:    Patient came from:                Home                                                                                           Anticipated d/c place:  Home  Barriers to d/c OR conditions which need to be met to effect a safe d/c:  Cardiology clearance   Consultants:  Tried hospitalist as consultant  Cardiology primary  Procedures:  None  Antibiotics:  None   Objective: BP (!) 101/58 (BP Location: Left Arm)   Pulse 73   Temp 97.7 F (36.5 C) (Oral)   Resp 20   Ht 5' 4" (1.626 m)   Wt 50.2 kg   SpO2 90%   BMI 19.00 kg/m   Intake/Output Summary (Last 24 hours) at 10/04/2019 0914 Last data filed at 10/04/2019 0800 Gross per 24 hour  Intake 363 ml  Output 400 ml  Net -37 ml   Filed Weights   10/02/19 0456 10/03/19 0335 10/04/19 0425  Weight: 52.5 kg 53 kg 50.2 kg    Exam: Patient is examined daily including today on 10/04/2019, exams remain the same as of yesterday except that has changed    General:  NAD  Cardiovascular: IRRR  Respiratory: CTABL  Abdomen: Soft/ND/NT,  positive BS  Musculoskeletal: No Edema  Neuro: alert, oriented   Data Reviewed: Basic Metabolic Panel: Recent Labs  Lab 09/30/19 1715 10/01/19 0804 10/03/19 0156 10/04/19 0143  NA 139 139 135 137  K 3.5 3.8 4.0 3.2*  CL 109 109 105 103  CO2 21* 21* 20* 23  GLUCOSE 109* 194* 103* 94  BUN 17 14 30* 27*  CREATININE 0.79 0.74 1.04* 0.82  CALCIUM 9.1 9.0 8.7* 8.5*  MG 1.9  --   --   --    Liver Function Tests: Recent Labs  Lab 09/30/19 1950  AST 23  ALT 19  ALKPHOS 89  BILITOT 1.4*  PROT 5.9*  ALBUMIN 3.1*   No results for input(s): LIPASE, AMYLASE in the last 168 hours. No results for input(s): AMMONIA in the last 168 hours. CBC: Recent Labs  Lab 09/30/19 1715  WBC 5.5  HGB 12.2  HCT 37.9  MCV 85.7  PLT 198  Cardiac Enzymes:   No results for input(s): CKTOTAL, CKMB, CKMBINDEX, TROPONINI in the last 168 hours. BNP (last 3 results) Recent Labs    09/30/19 1950  BNP 433.8*    ProBNP (last 3 results) No results for input(s): PROBNP in the last 8760 hours.  CBG: No results for input(s): GLUCAP in the last 168 hours.  Recent Results (from the past 240 hour(s))  Respiratory Panel by RT PCR (Flu A&B, Covid) - Nasopharyngeal Swab     Status: None   Collection Time: 09/30/19  6:32 PM   Specimen: Nasopharyngeal Swab  Result Value Ref Range Status   SARS Coronavirus 2 by RT PCR NEGATIVE NEGATIVE Final    Comment: (NOTE) SARS-CoV-2 target nucleic acids are NOT DETECTED. The SARS-CoV-2 RNA is generally detectable in upper respiratoy specimens during the acute phase of infection. The lowest concentration of SARS-CoV-2 viral copies this assay can detect is 131 copies/mL. A negative result does not preclude SARS-Cov-2 infection and should not be used as the sole basis for treatment or other patient management decisions. A negative result may occur with  improper specimen collection/handling, submission of specimen other than nasopharyngeal swab, presence of  viral mutation(s) within the areas targeted by this assay, and inadequate number of viral copies (<131 copies/mL). A negative result must be combined with clinical observations, patient history, and epidemiological information. The expected result is Negative. Fact Sheet for Patients:  PinkCheek.be Fact Sheet for Healthcare Providers:  GravelBags.it This test is not yet ap proved or cleared by the [place] FDA and  has been authorized for detection and/or diagnosis of SARS-CoV-2 by FDA under an Emergency Use Authorization (EUA). This EUA will remain  in effect (meaning this test can be used) for the duration of the COVID-19 declaration under Section 564(b)(1) of the Act, 21 U.S.C. section 360bbb-3(b)(1), unless the authorization is terminated or revoked sooner.    Influenza A by PCR NEGATIVE NEGATIVE Final   Influenza B by PCR NEGATIVE NEGATIVE Final    Comment: (NOTE) The Xpert Xpress SARS-CoV-2/FLU/RSV assay is intended as an aid in  the diagnosis of influenza from Nasopharyngeal swab specimens and  should not be used as a sole basis for treatment. Nasal washings and  aspirates are unacceptable for Xpert Xpress SARS-CoV-2/FLU/RSV  testing. Fact Sheet for Patients: PinkCheek.be Fact Sheet for Healthcare Providers: GravelBags.it This test is not yet approved or cleared by the [place] FDA and  has been authorized for detection and/or diagnosis of SARS-CoV-2 by  FDA under an Emergency Use Authorization (EUA). This EUA will remain  in effect (meaning this test can be used) for the duration of the  Covid-19 declaration under Section 564(b)(1) of the Act, 21  U.S.C. section 360bbb-3(b)(1), unless the authorization is  terminated or revoked. Performed at [facility], [place] [address], [place], [place] [address]   MRSA PCR Screening     Status: None    Collection Time: 09/30/19 11:19 PM   Specimen: Nasopharyngeal  Result Value Ref Range Status   MRSA by PCR NEGATIVE NEGATIVE Final    Comment:        The GeneXpert MRSA Assay (FDA approved for NASAL specimens only), is one component of a comprehensive MRSA colonization surveillance program. It is not intended to diagnose MRSA infection nor to guide or monitor treatment for MRSA infections. Performed at [facility], [place] [address], [place], [place] [address]      Studies: No results found.  Scheduled Meds: . apixaban  5 mg Oral BID  . atenolol  25 mg Oral BID  . calcium carbonate  2 tablet Oral Daily  . cholecalciferol  1,000 Units Oral Daily  . digoxin  0.125 mg Oral Daily  . methimazole  10 mg Oral BID  . sodium chloride flush  3 mL Intravenous Q12H    Continuous Infusions: . sodium chloride    . diltiazem (CARDIZEM) infusion 5 mg/hr (10/04/19 5361)     Time spent: 72mns I have personally reviewed and interpreted on  10/04/2019 daily labs, tele strips, imagings as discussed above under date review session and assessment and plans.  I reviewed all nursing notes, pharmacy notes, cardiology notes,  vitals, pertinent old records  I have discussed plan of care as described above with RN , patient on 10/04/2019   FFlorencia ReasonsMD, PhD, FACP  [facility]  Available via Epic secure chat 7am-7pm for nonurgent issues Please page for urgent issues, pager number available through [email or web address] .   10/04/2019, 9:14 AM  LOS: 4 days

## 2019-10-04 NOTE — Progress Notes (Signed)
Progress Note  Patient Name: Lynn Hayden Date of Encounter: 10/04/2019  Primary Cardiologist:   Minus Breeding, MD   Subjective   She says that she is breathing better.  She is fatigued.   Inpatient Medications    Scheduled Meds: . apixaban  5 mg Oral BID  . atenolol  25 mg Oral BID  . calcium carbonate  2 tablet Oral Daily  . cholecalciferol  1,000 Units Oral Daily  . digoxin  0.125 mg Oral Daily  . furosemide  40 mg Oral Daily  . methimazole  10 mg Oral BID  . potassium chloride  40 mEq Oral Once  . potassium iodide  150 mg Oral Q12H  . sodium chloride flush  3 mL Intravenous Q12H   Continuous Infusions: . sodium chloride     PRN Meds: sodium chloride, acetaminophen, ALPRAZolam, loperamide, nitroGLYCERIN, ondansetron (ZOFRAN) IV, ondansetron, sodium chloride flush, temazepam, zolpidem   Vital Signs    Vitals:   10/04/19 0000 10/04/19 0200 10/04/19 0425 10/04/19 0754  BP:   115/61 (!) 101/58  Pulse: 91 85 90 73  Resp: 19 20 20 20   Temp:   97.9 F (36.6 C) 97.7 F (36.5 C)  TempSrc:   Oral Oral  SpO2: 90% 93% 90% 90%  Weight:   50.2 kg   Height:        Intake/Output Summary (Last 24 hours) at 10/04/2019 1051 Last data filed at 10/04/2019 0800 Gross per 24 hour  Intake 363 ml  Output 400 ml  Net -37 ml   Filed Weights   10/02/19 0456 10/03/19 0335 10/04/19 0425  Weight: 52.5 kg 53 kg 50.2 kg    Telemetry    Atrial fib with rate improved.  - Personally Reviewed  ECG    NA  - Personally Reviewed  Physical Exam   GEN: No  acute distress.   Neck: No  JVD Cardiac: Irregular RR, no murmurs, rubs, or gallops.  Respiratory: Clear  to auscultation bilaterally. GI: Soft, nontender, non-distended, normal bowel sounds  MS:  No edema; No deformity. Neuro:   Nonfocal  Psych: Oriented and appropriate    Labs    Chemistry Recent Labs  Lab 09/30/19 1715 09/30/19 1950 10/01/19 0804 10/03/19 0156 10/04/19 0143  NA   < >  --  139 135 137  K   < >   --  3.8 4.0 3.2*  CL   < >  --  109 105 103  CO2   < >  --  21* 20* 23  GLUCOSE   < >  --  194* 103* 94  BUN   < >  --  14 30* 27*  CREATININE   < >  --  0.74 1.04* 0.82  CALCIUM   < >  --  9.0 8.7* 8.5*  PROT  --  5.9*  --   --   --   ALBUMIN  --  3.1*  --   --   --   AST  --  23  --   --   --   ALT  --  19  --   --   --   ALKPHOS  --  89  --   --   --   BILITOT  --  1.4*  --   --   --   GFRNONAA   < >  --  >60 51* >60  GFRAA   < >  --  >60 59* >60  ANIONGAP   < >  --  9 10 11    < > = values in this interval not displayed.     Hematology Recent Labs  Lab 09/30/19 1715  WBC 5.5  RBC 4.42  HGB 12.2  HCT 37.9  MCV 85.7  MCH 27.6  MCHC 32.2  RDW 13.8  PLT 198    Cardiac EnzymesNo results for input(s): TROPONINI in the last 168 hours. No results for input(s): TROPIPOC in the last 168 hours.   BNP Recent Labs  Lab 09/30/19 1950  BNP 433.8*     DDimer No results for input(s): DDIMER in the last 168 hours.   Radiology    No results found.  Cardiac Studies   Echocardiogram 09/30/19 1. Left ventricular ejection fraction, by estimation, is 60 to 65%. The  left ventricle has normal function. The left ventricle has no regional  wall motion abnormalities. Unable to assess LV diastolic filling due to  underlying atrial flutter.  2. Right ventricular systolic function is mildly reduced. The right  ventricular size is mildly enlarged. There is moderately elevated  pulmonary artery systolic pressure. The estimated right ventricular  systolic pressure is 61.3 mmHg.  3. Left atrial size was severely dilated.  4. Right atrial size was severely dilated.  5. The mitral valve is normal in structure. Mild to moderate mitral valve  regurgitation. No evidence of mitral stenosis.  6. Tricuspid valve regurgitation is moderate to severe.  7. The aortic valve is tricuspid. Aortic valve regurgitation is mild.  Mild to moderate aortic valve sclerosis/calcification is present,  without  any evidence of aortic stenosis.  8. The inferior vena cava is dilated in size with >50% respiratory  variability, suggesting right atrial pressure of 8 mmHg.  Patient Profile     79 y.o. female who presents for evaluation of atrial fib with RVR  Assessment & Plan      ATRIAL FIB WITH RVR:    Rate still increased but better.  BP is low.  I added dig.  She is on atenolol.  Possible DCCV on Monday.   I will try to stop the Cardizem IV.    CHF:  EF is preserved.  Increase Lasix yesterday. Breathing is improved and sats better.  Will give PO Lasix today.   HYPOKALEMIA:  Supplement and repeat BMET in the AM.     DECREASED TSH:     This is being managed by [facility].   For questions or updates, please contact [facility] Please consult [email or web address] for contact info under Cardiology/STEMI.   Signed, Tuesday, MD  10/04/2019, 10:51 AM

## 2019-10-05 LAB — CBC WITH DIFFERENTIAL/PLATELET
Abs Immature Granulocytes: 0.01 10*3/uL (ref 0.00–0.07)
Basophils Absolute: 0 10*3/uL (ref 0.0–0.1)
Basophils Relative: 0 %
Eosinophils Absolute: 0.2 10*3/uL (ref 0.0–0.5)
Eosinophils Relative: 2 %
HCT: 37.4 % (ref 36.0–46.0)
Hemoglobin: 12.2 g/dL (ref 12.0–15.0)
Immature Granulocytes: 0 %
Lymphocytes Relative: 31 %
Lymphs Abs: 2 10*3/uL (ref 0.7–4.0)
MCH: 27.9 pg (ref 26.0–34.0)
MCHC: 32.6 g/dL (ref 30.0–36.0)
MCV: 85.4 fL (ref 80.0–100.0)
Monocytes Absolute: 0.8 10*3/uL (ref 0.1–1.0)
Monocytes Relative: 12 %
Neutro Abs: 3.4 10*3/uL (ref 1.7–7.7)
Neutrophils Relative %: 55 %
Platelets: 209 10*3/uL (ref 150–400)
RBC: 4.38 MIL/uL (ref 3.87–5.11)
RDW: 14.1 % (ref 11.5–15.5)
WBC: 6.3 10*3/uL (ref 4.0–10.5)
nRBC: 0 % (ref 0.0–0.2)

## 2019-10-05 LAB — COMPREHENSIVE METABOLIC PANEL
ALT: 18 U/L (ref 0–44)
AST: 15 U/L (ref 15–41)
Albumin: 2.6 g/dL — ABNORMAL LOW (ref 3.5–5.0)
Alkaline Phosphatase: 83 U/L (ref 38–126)
Anion gap: 10 (ref 5–15)
BUN: 21 mg/dL (ref 8–23)
CO2: 23 mmol/L (ref 22–32)
Calcium: 8.7 mg/dL — ABNORMAL LOW (ref 8.9–10.3)
Chloride: 108 mmol/L (ref 98–111)
Creatinine, Ser: 0.83 mg/dL (ref 0.44–1.00)
GFR calc Af Amer: >60 mL/min (ref >60)
GFR calc non Af Amer: >60 mL/min (ref >60)
Glucose, Bld: 100 mg/dL — ABNORMAL HIGH (ref 70–99)
Potassium: 3.9 mmol/L (ref 3.5–5.1)
Sodium: 141 mmol/L (ref 135–145)
Total Bilirubin: 1.2 mg/dL (ref 0.3–1.2)
Total Protein: 5.3 g/dL — ABNORMAL LOW (ref 6.5–8.1)

## 2019-10-05 LAB — MAGNESIUM: Magnesium: 1.8 mg/dL (ref 1.7–2.4)

## 2019-10-05 MED ORDER — DILTIAZEM HCL-DEXTROSE 125-5 MG/125ML-% IV SOLN (PREMIX)
5.0000 mg/h | INTRAVENOUS | Status: DC
  Administered 2019-10-05: 5 mg/h via INTRAVENOUS
  Filled 2019-10-05 (×2): qty 125

## 2019-10-05 NOTE — Progress Notes (Signed)
Pt's HR is still bouncing back and forth from 110-150's although pt is not complaining CP, SOB and distress. Pt is aware of cardioversion procedure tomorrow. Talked with PA strader regarding her current rhythm. Morning dose of tenormin is given, will continue to monitor.  Lonia Farber, RN

## 2019-10-05 NOTE — Progress Notes (Signed)
Cardizem running @5  mg/hr, HR maintained at 85-110's. BP is 110/65. Will not titrate  the cardizem this time. Pt ambulated in a hallway without SOB and distress, denies chest pain, consent taken for cardioversion procedure, Niece is in bed side and is updated, will continue to monitor  , RN

## 2019-10-05 NOTE — Progress Notes (Addendum)
CONSULTATION PROGRESS NOTE  Lynn Hayden EHU:[id number] DOB: 1940/10/02 DOA: 09/30/2019 PCP: No primary care provider on file.  HPI/Recap of past 24 hours:  Remain tachycardic, sitting up in chair, denies chest pain, no short of breath, no edema   Assessment/Plan: Active Problems:   Insomnia   Persistent atrial fibrillation with rapid ventricular response (HCC)   Hyperthyroidism   Prediabetes  A. fib RVR; she is started back on Cardizem drip , on atenolol , digoxin , apixaban  management for cardiology  Hyperthyroidism -Suppressed TSH less than 0.01, with elevated T3 313 Free T4 3.62, positive thyroid-stimulating hemoglobin at 7.24 -Case discussed with endocrinologist Dr.Cristina  Gherghe who agree with methimazole 10 twice daily, also recommended SSKI on Saturday and Sunday, then repeat T3 and T4 on Monday, Plan to discharge on methimazole and  follow-up with Dr Cruzita Lederer in 4 weeks  Hypokalemia Replace K  DVT Prophylaxis:On Eliquis  Code Status: Full  Family Communication: patient   Disposition Plan:    Patient came from:                Home                                                                                           Anticipated d/c place:  Home  Barriers to d/c OR conditions which need to be met to effect a safe d/c:  Cardiology clearance   Consultants:  Tried hospitalist as consultant  Cardiology primary  Phone conversation with endocrinologist  Dr.Cristina  Gherghe   Procedures:  None  Antibiotics:  None   Objective: BP 125/82 (BP Location: Left Arm)   Pulse (!) 124   Temp 98.4 F (36.9 C) (Oral)   Resp 20   Ht '5\' 4"'$  (1.626 m)   Wt 49.7 kg   SpO2 98%   BMI 18.81 kg/m   Intake/Output Summary (Last 24 hours) at 10/05/2019 1031 Last data filed at 10/05/2019 0745 Gross per 24 hour  Intake 840 ml  Output 1100 ml  Net -260 ml   Filed Weights   10/03/19 0335 10/04/19 0425 10/05/19 0535  Weight: 53 kg 50.2 kg 49.7 kg     Exam: Patient is examined daily including today on 10/05/2019, exams remain the same as of yesterday except that has changed    General:  NAD  Cardiovascular: IRRR  Respiratory: CTABL  Abdomen: Soft/ND/NT, positive BS  Musculoskeletal: No Edema  Neuro: alert, oriented   Data Reviewed: Basic Metabolic Panel: Recent Labs  Lab 09/30/19 1715 10/01/19 0804 10/03/19 0156 10/04/19 0143 10/05/19 0220  NA 139 139 135 137 141  K 3.5 3.8 4.0 3.2* 3.9  CL 109 109 105 103 108  CO2 21* 21* 20* 23 23  GLUCOSE 109* 194* 103* 94 100*  BUN 17 14 30* 27* 21  CREATININE 0.79 0.74 1.04* 0.82 0.83  CALCIUM 9.1 9.0 8.7* 8.5* 8.7*  MG 1.9  --   --   --  1.8   Liver Function Tests: Recent Labs  Lab 09/30/19 1950 10/05/19 0220  AST 23 15  ALT 19 18  ALKPHOS 89 83  BILITOT 1.4*  1.2  PROT 5.9* 5.3*  ALBUMIN 3.1* 2.6*   No results for input(s): LIPASE, AMYLASE in the last 168 hours. No results for input(s): AMMONIA in the last 168 hours. CBC: Recent Labs  Lab 09/30/19 1715 10/05/19 0220  WBC 5.5 6.3  NEUTROABS  --  3.4  HGB 12.2 12.2  HCT 37.9 37.4  MCV 85.7 85.4  PLT 198 209   Cardiac Enzymes:   No results for input(s): CKTOTAL, CKMB, CKMBINDEX, TROPONINI in the last 168 hours. BNP (last 3 results) Recent Labs    09/30/19 1950  BNP 433.8*    ProBNP (last 3 results) No results for input(s): PROBNP in the last 8760 hours.  CBG: No results for input(s): GLUCAP in the last 168 hours.  Recent Results (from the past 240 hour(s))  Respiratory Panel by RT PCR (Flu A&B, Covid) - Nasopharyngeal Swab     Status: None   Collection Time: 09/30/19  6:32 PM   Specimen: Nasopharyngeal Swab  Result Value Ref Range Status   SARS Coronavirus 2 by RT PCR NEGATIVE NEGATIVE Final    Comment: (NOTE) SARS-CoV-2 target nucleic acids are NOT DETECTED. The SARS-CoV-2 RNA is generally detectable in upper respiratoy specimens during the acute phase of infection. The  lowest concentration of SARS-CoV-2 viral copies this assay can detect is 131 copies/mL. A negative result does not preclude SARS-Cov-2 infection and should not be used as the sole basis for treatment or other patient management decisions. A negative result may occur with  improper specimen collection/handling, submission of specimen other than nasopharyngeal swab, presence of viral mutation(s) within the areas targeted by this assay, and inadequate number of viral copies (<131 copies/mL). A negative result must be combined with clinical observations, patient history, and epidemiological information. The expected result is Negative. Fact Sheet for Patients:  PinkCheek.be Fact Sheet for Healthcare Providers:  GravelBags.it This test is not yet ap proved or cleared by the [place] FDA and  has been authorized for detection and/or diagnosis of SARS-CoV-2 by FDA under an Emergency Use Authorization (EUA). This EUA will remain  in effect (meaning this test can be used) for the duration of the COVID-19 declaration under Section 564(b)(1) of the Act, 21 U.S.C. section 360bbb-3(b)(1), unless the authorization is terminated or revoked sooner.    Influenza A by PCR NEGATIVE NEGATIVE Final   Influenza B by PCR NEGATIVE NEGATIVE Final    Comment: (NOTE) The Xpert Xpress SARS-CoV-2/FLU/RSV assay is intended as an aid in  the diagnosis of influenza from Nasopharyngeal swab specimens and  should not be used as a sole basis for treatment. Nasal washings and  aspirates are unacceptable for Xpert Xpress SARS-CoV-2/FLU/RSV  testing. Fact Sheet for Patients: PinkCheek.be Fact Sheet for Healthcare Providers: GravelBags.it This test is not yet approved or cleared by the [place] FDA and  has been authorized for detection and/or diagnosis of SARS-CoV-2 by  FDA under an Emergency  Use Authorization (EUA). This EUA will remain  in effect (meaning this test can be used) for the duration of the  Covid-19 declaration under Section 564(b)(1) of the Act, 21  U.S.C. section 360bbb-3(b)(1), unless the authorization is  terminated or revoked. Performed at [facility], [place] [address], [place], [place] [address]   MRSA PCR Screening     Status: None   Collection Time: 09/30/19 11:19 PM   Specimen: Nasopharyngeal  Result Value Ref Range Status   MRSA by PCR NEGATIVE NEGATIVE Final    Comment:  The GeneXpert MRSA Assay (FDA approved for NASAL specimens only), is one component of a comprehensive MRSA colonization surveillance program. It is not intended to diagnose MRSA infection nor to guide or monitor treatment for MRSA infections. Performed at [facility], [place] [address] [place], [place] [address]      Studies: No results found.  Scheduled Meds: . apixaban  5 mg Oral BID  . atenolol  37.5 mg Oral BID  . calcium carbonate  2 tablet Oral Daily  . cholecalciferol  1,000 Units Oral Daily  . digoxin  0.125 mg Oral Daily  . furosemide  40 mg Oral Daily  . methimazole  10 mg Oral BID  . potassium iodide  150 mg Oral Q12H  . sodium chloride flush  3 mL Intravenous Q12H    Continuous Infusions: . sodium chloride       Time spent: 68mns I have personally reviewed and interpreted on  10/05/2019 daily labs, tele strips, imagings as discussed above under date review session and assessment and plans.  I reviewed all nursing notes, pharmacy notes, cardiology notes,  vitals, pertinent old records  I have discussed plan of care as described above with RN , patient on 10/05/2019   FFlorencia ReasonsMD, PhD, FACP  [facility]  Available via Epic secure chat 7am-7pm for nonurgent issues Please page for urgent issues, pager number available through [email or web address] .   10/05/2019, 10:31 AM  LOS: 5 days

## 2019-10-05 NOTE — Progress Notes (Signed)
Patient has been running a fib during the night. As high as 130s nonsustained. Patient is alert, easily awaken. Patient denies pain or shortness of breath. No distress. Assisted to [facility] x 1. Tolerated well. Breaths inaccurate, patient is breathing at 22bpm.

## 2019-10-05 NOTE — Progress Notes (Signed)
Progress Note  Patient Name: Lynn Hayden Date of Encounter: 10/05/2019  Primary Cardiologist:   Minus Breeding, MD   Subjective   HR was elevated most of the night.  No pain.  Breathing OK.   Inpatient Medications    Scheduled Meds: . apixaban  5 mg Oral BID  . atenolol  37.5 mg Oral BID  . calcium carbonate  2 tablet Oral Daily  . cholecalciferol  1,000 Units Oral Daily  . digoxin  0.125 mg Oral Daily  . furosemide  40 mg Oral Daily  . methimazole  10 mg Oral BID  . potassium iodide  150 mg Oral Q12H  . sodium chloride flush  3 mL Intravenous Q12H   Continuous Infusions: . sodium chloride     PRN Meds: sodium chloride, acetaminophen, ALPRAZolam, loperamide, nitroGLYCERIN, ondansetron (ZOFRAN) IV, ondansetron, sodium chloride flush, temazepam, zolpidem   Vital Signs    Vitals:   10/04/19 2327 10/05/19 0530 10/05/19 0535 10/05/19 0736  BP: 120/70 135/88  112/66  Pulse: 91 (!) 119  (!) 128  Resp: (!) 23 (!) 25  20  Temp: 98 F (36.7 C) 97.7 F (36.5 C)  98.4 F (36.9 C)  TempSrc: Oral Oral  Oral  SpO2: 91% 94%  94%  Weight:   49.7 kg   Height:        Intake/Output Summary (Last 24 hours) at 10/05/2019 0900 Last data filed at 10/05/2019 0745 Gross per 24 hour  Intake 840 ml  Output 1100 ml  Net -260 ml   Filed Weights   10/03/19 0335 10/04/19 0425 10/05/19 0535  Weight: 53 kg 50.2 kg 49.7 kg    Telemetry    Atrial fib with rapid rate  - Personally Reviewed  ECG    NA  - Personally Reviewed  Physical Exam   GEN: No  acute distress.   Neck: No  JVD Cardiac: Irregular RR,  murmurs, rubs, or gallops.  Respiratory: Clear   to auscultation bilaterally. GI: Soft, nontender, non-distended, normal bowel sounds  MS:  No edema; No deformity. Neuro:   Nonfocal  Psych: Oriented and appropriate    Labs    Chemistry Recent Labs  Lab 09/30/19 1950 10/01/19 0804 10/03/19 0156 10/04/19 0143 10/05/19 0220  NA  --    < > 135 137 141  K  --    < >  4.0 3.2* 3.9  CL  --    < > 105 103 108  CO2  --    < > 20* 23 23  GLUCOSE  --    < > 103* 94 100*  BUN  --    < > 30* 27* 21  CREATININE  --    < > 1.04* 0.82 0.83  CALCIUM  --    < > 8.7* 8.5* 8.7*  PROT 5.9*  --   --   --  5.3*  ALBUMIN 3.1*  --   --   --  2.6*  AST 23  --   --   --  15  ALT 19  --   --   --  18  ALKPHOS 89  --   --   --  83  BILITOT 1.4*  --   --   --  1.2  GFRNONAA  --    < > 51* >60 >60  GFRAA  --    < > 59* >60 >60  ANIONGAP  --    < > 10 11 10    < > =  values in this interval not displayed.     Hematology Recent Labs  Lab 09/30/19 1715 10/05/19 0220  WBC 5.5 6.3  RBC 4.42 4.38  HGB 12.2 12.2  HCT 37.9 37.4  MCV 85.7 85.4  MCH 27.6 27.9  MCHC 32.2 32.6  RDW 13.8 14.1  PLT 198 209    Cardiac EnzymesNo results for input(s): TROPONINI in the last 168 hours. No results for input(s): TROPIPOC in the last 168 hours.   BNP Recent Labs  Lab 09/30/19 1950  BNP 433.8*     DDimer No results for input(s): DDIMER in the last 168 hours.   Radiology    No results found.  Cardiac Studies   Echocardiogram 09/30/19 1. Left ventricular ejection fraction, by estimation, is 60 to 65%. The  left ventricle has normal function. The left ventricle has no regional  wall motion abnormalities. Unable to assess LV diastolic filling due to  underlying atrial flutter.  2. Right ventricular systolic function is mildly reduced. The right  ventricular size is mildly enlarged. There is moderately elevated  pulmonary artery systolic pressure. The estimated right ventricular  systolic pressure is 61.3 mmHg.  3. Left atrial size was severely dilated.  4. Right atrial size was severely dilated.  5. The mitral valve is normal in structure. Mild to moderate mitral valve  regurgitation. No evidence of mitral stenosis.  6. Tricuspid valve regurgitation is moderate to severe.  7. The aortic valve is tricuspid. Aortic valve regurgitation is mild.  Mild to moderate  aortic valve sclerosis/calcification is present, without  any evidence of aortic stenosis.  8. The inferior vena cava is dilated in size with >50% respiratory  variability, suggesting right atrial pressure of 8 mmHg.  Patient Profile     79 y.o. female who presents for evaluation of atrial fib with RVR  Assessment & Plan     ATRIAL FIB WITH RVR:    Rate is elevated.  I will restart IV dilt.  We have written orders for a DCCV in the morning but she is not yet on the schedule.  Keep NPO after MN and we will check the schedule in the AM.   CHF:  EF is preserved.  On PO Lasix.   DECREASED TSH:     This is being managed by [facility]. Currently being treated with SSKI.    For questions or updates, please contact [facility] Please consult [email or web address] for contact info under Cardiology/STEMI.   Signed, Rollene Rotunda, MD  10/05/2019, 9:00 AM

## 2019-10-05 NOTE — Progress Notes (Signed)
Pt converted into NSR around 2100. MD notified, and d/c Cardizem drip. Pt currently stable. Vitals signs are stable. Will continue to monitor.

## 2019-10-05 NOTE — Plan of Care (Signed)
  Problem: Activity: Goal: Ability to tolerate increased activity will improve Outcome: Progressing   Problem: Cardiac: Goal: Ability to achieve and maintain adequate cardiopulmonary perfusion will improve Outcome: Progressing   Problem: Education: Goal: Knowledge of General Education information will improve Description: Including pain rating scale, medication(s)/side effects and non-pharmacologic comfort measures Outcome: Progressing

## 2019-10-05 NOTE — Progress Notes (Addendum)
   10/05/19 0948  Assess: MEWS Score  Temp 98.4 F (36.9 C)  BP 125/82  Pulse Rate (!) 124  ECG Heart Rate (!) 121  Resp 20  Level of Consciousness Alert  SpO2 98 %  O2 Device Room Air  Assess: MEWS Score  MEWS Temp 0  MEWS Systolic 0  MEWS Pulse 2  MEWS RR 0  MEWS LOC 0  MEWS Score 2  MEWS Score Color Yellow  Assess: if the MEWS score is Yellow or Red  Were vital signs taken at a resting state? Yes  Focused Assessment Documented focused assessment  Early Detection of Sepsis Score *See Row Information* Low  Take Vital Signs  Increase Vital Sign Frequency  Yellow: Q 2hr X 2 then Q 4hr X 2, if remains yellow, continue Q 4hrs  Notify: Provider  Provider Name/Title PA Strader  Date Provider Notified 10/05/19  Time Provider Notified [phone]  Notification Type Call  Response No new orders  Date of Provider Response 10/05/19  Time of Provider Response [phone]

## 2019-10-06 ENCOUNTER — Encounter (HOSPITAL_COMMUNITY)
Admission: EM | Disposition: A | Discharge status: Home / Self Care | Payer: Self-pay | Source: Home / Self Care | Attending: Cardiovascular Disease

## 2019-10-06 DIAGNOSIS — E876 Hypokalemia: Secondary | ICD-10-CM

## 2019-10-06 LAB — BASIC METABOLIC PANEL
Anion gap: 9 (ref 5–15)
BUN: 22 mg/dL (ref 8–23)
CO2: 27 mmol/L (ref 22–32)
Calcium: 8.4 mg/dL — ABNORMAL LOW (ref 8.9–10.3)
Chloride: 104 mmol/L (ref 98–111)
Creatinine, Ser: 1.07 mg/dL — ABNORMAL HIGH (ref 0.44–1.00)
GFR calc Af Amer: 57 mL/min — ABNORMAL LOW (ref >60)
GFR calc non Af Amer: 49 mL/min — ABNORMAL LOW (ref >60)
Glucose, Bld: 93 mg/dL (ref 70–99)
Potassium: 3.3 mmol/L — ABNORMAL LOW (ref 3.5–5.1)
Sodium: 140 mmol/L (ref 135–145)

## 2019-10-06 LAB — T4, FREE: Free T4: 2.09 ng/dL — ABNORMAL HIGH (ref 0.61–1.12)

## 2019-10-06 LAB — MAGNESIUM: Magnesium: 1.7 mg/dL (ref 1.7–2.4)

## 2019-10-06 SURGERY — CARDIOVERSION
Anesthesia: General

## 2019-10-06 MED ORDER — ATENOLOL 25 MG PO TABS
12.5000 mg | ORAL_TABLET | Freq: Once | ORAL | Status: AC
  Administered 2019-10-06: 12.5 mg via ORAL
  Filled 2019-10-06: qty 0.5

## 2019-10-06 MED ORDER — ATENOLOL 50 MG PO TABS
50.0000 mg | ORAL_TABLET | Freq: Two times a day (BID) | ORAL | Status: DC
  Administered 2019-10-06 – 2019-10-07 (×2): 50 mg via ORAL
  Filled 2019-10-06 (×3): qty 1

## 2019-10-06 MED ORDER — POTASSIUM CHLORIDE 10 MEQ/100ML IV SOLN
INTRAVENOUS | Status: AC
  Filled 2019-10-06: qty 100

## 2019-10-06 MED ORDER — POTASSIUM CHLORIDE 10 MEQ/100ML IV SOLN
10.0000 meq | INTRAVENOUS | Status: AC
  Administered 2019-10-06 (×3): 10 meq via INTRAVENOUS
  Filled 2019-10-06 (×2): qty 100

## 2019-10-06 MED ORDER — MAGNESIUM SULFATE 2 GM/50ML IV SOLN
2.0000 g | Freq: Once | INTRAVENOUS | Status: AC
  Administered 2019-10-06: 2 g via INTRAVENOUS
  Filled 2019-10-06: qty 50

## 2019-10-06 MED ORDER — POTASSIUM CHLORIDE CRYS ER 20 MEQ PO TBCR
40.0000 meq | EXTENDED_RELEASE_TABLET | Freq: Every day | ORAL | Status: DC
  Administered 2019-10-06 – 2019-10-07 (×2): 40 meq via ORAL
  Filled 2019-10-06 (×2): qty 2

## 2019-10-06 NOTE — Plan of Care (Signed)

## 2019-10-06 NOTE — Progress Notes (Addendum)
MEDICAL CONSULTATION  BRAD MCGAUGHY  NWG:[id number] DOB: 07-May-1941 DOA: 09/30/2019 PCP: No primary care provider on file.    Brief Narrative:  79 year old who presented to the ED 3/22 with tachycardia and was found to be in atrial fibrillation with RVR.  She was given meds for rate control and converted spontaneously with a TTE noting preserved LV function.  She was placed on diltiazem and Eliquis and was able to be sent home.  She returned to the ED 4/27 with worsening dyspnea over 2 days despite compliance with her diltiazem.  Work-up of her atrial fibrillation revealed evidence of hyperthyroidism and [facility] was consulted.  Subjective: The patient converted spontaneously to NSR this morning.  She is sitting up in a bedside chair and feels much better.  She denies fevers chills shortness of breath nausea or vomiting.  We spoke at great length regarding hyperthyroidism, the options for treatment, and the link between it and atrial fibrillation with RVR.  Assessment & Plan:  Atrial fibrillation with RVR Care per cardiology  Hypothyroidism TSH suppressed at <0.01 -T3 elevated at 313 with free T4 of 3.62 -positive thyroid-stimulating immunoglobulin at 7.24 -Dr. Elvera Lennox has agreed with Tapazole 10 mg twice daily as well as SSKI -continue Tapazole at discharge and follow-up with Dr. Elvera Lennox in 4 weeks - free T4 is trending down with treatment -will discontinue SSKI at time of discharge  Hypokalemia Supplement to goal of 4.0  Hypomagnesemia Supplement to goal of 2.0  DVT prophylaxis: Eliquis Code Status: FULL CODE   Objective: Blood pressure (!) 131/91, pulse 70, temperature 98 F (36.7 C), temperature source Oral, resp. rate 20, height 5\' 4"  (1.626 m), weight 48.6 kg, SpO2 96 %.  Intake/Output Summary (Last 24 hours) at 10/06/2019 0824 Last data filed at 10/05/2019 2213 Gross per 24 hour  Intake 490 ml  Output 900 ml  Net -410 ml   Filed Weights   10/04/19 0425 10/05/19 0535 10/06/19  0411  Weight: 50.2 kg 49.7 kg 48.6 kg    Examination: General: No acute respiratory distress Lungs: Clear to auscultation bilaterally without wheezes or crackles Cardiovascular: Regular rate and rhythm without murmur gallop or rub normal S1 and S2 Abdomen: Nontender, nondistended, soft, bowel sounds positive, no rebound, no ascites, no appreciable mass Extremities: No significant cyanosis, clubbing, or edema bilateral lower extremities  CBC: Recent Labs  Lab 09/30/19 1715 10/05/19 0220  WBC 5.5 6.3  NEUTROABS  --  3.4  HGB 12.2 12.2  HCT 37.9 37.4  MCV 85.7 85.4  PLT 198 209   Basic Metabolic Panel: Recent Labs  Lab 09/30/19 1715 10/01/19 0804 10/04/19 0143 10/05/19 0220 10/06/19 0230  NA 139   < > 137 141 140  K 3.5   < > 3.2* 3.9 3.3*  CL 109   < > 103 108 104  CO2 21*   < > 23 23 27   GLUCOSE 109*   < > 94 100* 93  BUN 17   < > 27* 21 22  CREATININE 0.79   < > 0.82 0.83 1.07*  CALCIUM 9.1   < > 8.5* 8.7* 8.4*  MG 1.9  --   --  1.8 1.7   < > = values in this interval not displayed.   GFR: Estimated Creatinine Clearance: 32.7 mL/min (A) (by C-G formula based on SCr of 1.07 mg/dL (H)).  Liver Function Tests: Recent Labs  Lab 09/30/19 1950 10/05/19 0220  AST 23 15  ALT 19 18  ALKPHOS 89 83  BILITOT 1.4* 1.2  PROT 5.9* 5.3*  ALBUMIN 3.1* 2.6*    HbA1C: Hgb A1c MFr Bld  Date/Time Value Ref Range Status  09/30/2019 07:50 PM 6.0 (H) 4.8 - 5.6 % Final    Comment:    (NOTE) Pre diabetes:          5.7%-6.4% Diabetes:              >6.4% Glycemic control for   <7.0% adults with diabetes     Recent Results (from the past 240 hour(s))  Respiratory Panel by RT PCR (Flu A&B, Covid) - Nasopharyngeal Swab     Status: None   Collection Time: 09/30/19  6:32 PM   Specimen: Nasopharyngeal Swab  Result Value Ref Range Status   SARS Coronavirus 2 by RT PCR NEGATIVE NEGATIVE Final    Comment: (NOTE) SARS-CoV-2 target nucleic acids are NOT DETECTED. The  SARS-CoV-2 RNA is generally detectable in upper respiratoy specimens during the acute phase of infection. The lowest concentration of SARS-CoV-2 viral copies this assay can detect is 131 copies/mL. A negative result does not preclude SARS-Cov-2 infection and should not be used as the sole basis for treatment or other patient management decisions. A negative result may occur with  improper specimen collection/handling, submission of specimen other than nasopharyngeal swab, presence of viral mutation(s) within the areas targeted by this assay, and inadequate number of viral copies (<131 copies/mL). A negative result must be combined with clinical observations, patient history, and epidemiological information. The expected result is Negative. Fact Sheet for Patients:  https://www.moore.com/ Fact Sheet for Healthcare Providers:  https://www.young.biz/ This test is not yet ap proved or cleared by the [place] FDA and  has been authorized for detection and/or diagnosis of SARS-CoV-2 by FDA under an Emergency Use Authorization (EUA). This EUA will remain  in effect (meaning this test can be used) for the duration of the COVID-19 declaration under Section 564(b)(1) of the Act, 21 U.S.C. section 360bbb-3(b)(1), unless the authorization is terminated or revoked sooner.    Influenza A by PCR NEGATIVE NEGATIVE Final   Influenza B by PCR NEGATIVE NEGATIVE Final    Comment: (NOTE) The Xpert Xpress SARS-CoV-2/FLU/RSV assay is intended as an aid in  the diagnosis of influenza from Nasopharyngeal swab specimens and  should not be used as a sole basis for treatment. Nasal washings and  aspirates are unacceptable for Xpert Xpress SARS-CoV-2/FLU/RSV  testing. Fact Sheet for Patients: https://www.moore.com/ Fact Sheet for Healthcare Providers: https://www.young.biz/ This test is not yet approved or cleared by the [place] FDA and  has been authorized for detection and/or diagnosis of SARS-CoV-2 by  FDA under an Emergency Use Authorization (EUA). This EUA will remain  in effect (meaning this test can be used) for the duration of the  Covid-19 declaration under Section 564(b)(1) of the Act, 21  U.S.C. section 360bbb-3(b)(1), unless the authorization is  terminated or revoked. Performed at [facility], [address] [place], [place] [address]   MRSA PCR Screening     Status: None   Collection Time: 09/30/19 11:19 PM   Specimen: Nasopharyngeal  Result Value Ref Range Status   MRSA by PCR NEGATIVE NEGATIVE Final    Comment:        The GeneXpert MRSA Assay (FDA approved for NASAL specimens only), is one component of a comprehensive MRSA colonization surveillance program. It is not intended to diagnose MRSA infection nor to guide or monitor treatment for MRSA infections. Performed at [facility], [place] [address], [place],  [address]      Scheduled Meds: . apixaban  5 mg Oral BID  . atenolol  37.5 mg Oral BID  . calcium carbonate  2 tablet Oral Daily  . cholecalciferol  1,000 Units Oral Daily  . digoxin  0.125 mg Oral Daily  . furosemide  40 mg Oral Daily  . methimazole  10 mg Oral BID  . potassium iodide  150 mg Oral Q12H  . sodium chloride flush  3 mL Intravenous Q12H   Continuous Infusions: . sodium chloride    . diltiazem (CARDIZEM) infusion 5 mg/hr (10/05/19 1208)     LOS: 6 days   Cherene Altes, MD [facility] Office  [phone] Pager - Text Page per Amion  If 7PM-7AM, please contact night-coverage per Amion 10/06/2019, 8:24 AM

## 2019-10-06 NOTE — Progress Notes (Signed)
Progress Note  Patient Name: Lynn Hayden Date of Encounter: 10/06/2019  Primary Cardiologist:   Rollene Rotunda, MD   Subjective   Converted to NSR.  Denies any chest pain or SOB.   Inpatient Medications    Scheduled Meds: . apixaban  5 mg Oral BID  . atenolol  37.5 mg Oral BID  . calcium carbonate  2 tablet Oral Daily  . cholecalciferol  1,000 Units Oral Daily  . digoxin  0.125 mg Oral Daily  . furosemide  40 mg Oral Daily  . methimazole  10 mg Oral BID  . potassium iodide  150 mg Oral Q12H  . sodium chloride flush  3 mL Intravenous Q12H   Continuous Infusions: . sodium chloride    . diltiazem (CARDIZEM) infusion 5 mg/hr (10/05/19 1208)  . magnesium sulfate bolus IVPB 2 g (10/06/19 1011)  . potassium chloride 10 mEq (10/06/19 1007)   PRN Meds: sodium chloride, acetaminophen, ALPRAZolam, loperamide, nitroGLYCERIN, ondansetron (ZOFRAN) IV, ondansetron, sodium chloride flush, temazepam, zolpidem   Vital Signs    Vitals:   10/05/19 2341 10/06/19 0411 10/06/19 0721 10/06/19 0736  BP: 109/69 115/68  (!) 131/91  Pulse: 72 78 72 70  Resp: 17 19 17 20   Temp: 98.5 F (36.9 C) 98 F (36.7 C)  98 F (36.7 C)  TempSrc: Oral Oral  Oral  SpO2: 91% 96% 97% 96%  Weight:  48.6 kg    Height:        Intake/Output Summary (Last 24 hours) at 10/06/2019 1050 Last data filed at 10/05/2019 2213 Gross per 24 hour  Intake 490 ml  Output 900 ml  Net -410 ml   Filed Weights   10/04/19 0425 10/05/19 0535 10/06/19 0411  Weight: 50.2 kg 49.7 kg 48.6 kg    Telemetry    NSR with several short bursts of afib with RVR - Personally Reviewed  ECG    NSR with PVC and nonspecific ST abnormality  - Personally Reviewed  Physical Exam   GEN: Well nourished, well developed in no acute distress HEENT: Normal NECK: No JVD; No carotid bruits LYMPHATICS: No lymphadenopathy CARDIAC:RRR, no murmurs, rubs, gallops RESPIRATORY:  Clear to auscultation without rales, wheezing or rhonchi    ABDOMEN: Soft, non-tender, non-distended MUSCULOSKELETAL:  No edema; No deformity  SKIN: Warm and dry NEUROLOGIC:  Alert and oriented x 3 PSYCHIATRIC:  Normal affect    Labs    Chemistry Recent Labs  Lab 09/30/19 1950 10/01/19 0804 10/04/19 0143 10/05/19 0220 10/06/19 0230  NA  --    < > 137 141 140  K  --    < > 3.2* 3.9 3.3*  CL  --    < > 103 108 104  CO2  --    < > 23 23 27   GLUCOSE  --    < > 94 100* 93  BUN  --    < > 27* 21 22  CREATININE  --    < > 0.82 0.83 1.07*  CALCIUM  --    < > 8.5* 8.7* 8.4*  PROT 5.9*  --   --  5.3*  --   ALBUMIN 3.1*  --   --  2.6*  --   AST 23  --   --  15  --   ALT 19  --   --  18  --   ALKPHOS 89  --   --  83  --   BILITOT 1.4*  --   --  1.2  --   GFRNONAA  --    < > >60 >60 49*  GFRAA  --    < > >60 >60 57*  ANIONGAP  --    < > 11 10 9    < > = values in this interval not displayed.     Hematology Recent Labs  Lab 09/30/19 1715 10/05/19 0220  WBC 5.5 6.3  RBC 4.42 4.38  HGB 12.2 12.2  HCT 37.9 37.4  MCV 85.7 85.4  MCH 27.6 27.9  MCHC 32.2 32.6  RDW 13.8 14.1  PLT 198 209    Cardiac EnzymesNo results for input(s): TROPONINI in the last 168 hours. No results for input(s): TROPIPOC in the last 168 hours.   BNP Recent Labs  Lab 09/30/19 1950  BNP 433.8*     DDimer No results for input(s): DDIMER in the last 168 hours.   Radiology    No results found.  Cardiac Studies   Echocardiogram 09/30/19 1. Left ventricular ejection fraction, by estimation, is 60 to 65%. The  left ventricle has normal function. The left ventricle has no regional  wall motion abnormalities. Unable to assess LV diastolic filling due to  underlying atrial flutter.  2. Right ventricular systolic function is mildly reduced. The right  ventricular size is mildly enlarged. There is moderately elevated  pulmonary artery systolic pressure. The estimated right ventricular  systolic pressure is 25.3 mmHg.  3. Left atrial size was severely  dilated.  4. Right atrial size was severely dilated.  5. The mitral valve is normal in structure. Mild to moderate mitral valve  regurgitation. No evidence of mitral stenosis.  6. Tricuspid valve regurgitation is moderate to severe.  7. The aortic valve is tricuspid. Aortic valve regurgitation is mild.  Mild to moderate aortic valve sclerosis/calcification is present, without  any evidence of aortic stenosis.  8. The inferior vena cava is dilated in size with >50% respiratory  variability, suggesting right atrial pressure of 8 mmHg.  Patient Profile     79 y.o. female who presents for evaluation of atrial fib with RVR  Assessment & Plan     1.  ATRIAL FIB WITH RVR:     -she has converted to NSR although still having some breakthrough PAF -cancel DCCV -continue Apixaban 5mg  BID -increase Atenolol to 50mg  BID -stop IV Cardizem gtt -stop dig  2.  Chronic diastolic CHF:   -EF is preserved.   -appears euvolemic on exam -continue Lasix 40mg  daily PO -creatinine 1.07 this am and K+ 3.3 and Mag 1.7 -replete K+ and Mag  3.  DECREASED TSH:      -TSH suppressed at <0.01  -T3 elevated at 313 with free T4 of 3.62  -positive thyroid-stimulating immunoglobulin at 7.24  -Dr. Cruzita Lederer has agreed with Tapazole 10 mg twice daily as well as SSKI on Saturdays and Sundays  -continue Tapazole at discharge and follow-up with Dr. Cruzita Lederer in 4 weeks  -free T4 is trending down with treatment -This is being managed by [facility].   4. Hypokalemia -K+ 3.3 -replete with Kdur 17meq daily -BMET in am  5.  Hypomagnesemia -replete mag  -recheck Mag in am  Will keep an additional day to make sure afib is suppressed on higher dose of BB.   For questions or updates, please contact [place] Please consult [email or web address] for contact info under Cardiology/STEMI.   Signed, Fransico Him, MD  10/06/2019, 10:50 AM

## 2019-10-07 ENCOUNTER — Encounter (HOSPITAL_COMMUNITY): Payer: Self-pay | Admitting: Cardiovascular Disease

## 2019-10-07 ENCOUNTER — Telehealth: Payer: Self-pay | Admitting: Physician Assistant

## 2019-10-07 DIAGNOSIS — E876 Hypokalemia: Secondary | ICD-10-CM

## 2019-10-07 DIAGNOSIS — I272 Pulmonary hypertension, unspecified: Secondary | ICD-10-CM

## 2019-10-07 DIAGNOSIS — I5032 Chronic diastolic (congestive) heart failure: Secondary | ICD-10-CM

## 2019-10-07 LAB — BASIC METABOLIC PANEL
Anion gap: 4 — ABNORMAL LOW (ref 5–15)
BUN: 17 mg/dL (ref 8–23)
CO2: 26 mmol/L (ref 22–32)
Calcium: 8.8 mg/dL — ABNORMAL LOW (ref 8.9–10.3)
Chloride: 106 mmol/L (ref 98–111)
Creatinine, Ser: 0.73 mg/dL (ref 0.44–1.00)
GFR calc Af Amer: >60 mL/min (ref >60)
GFR calc non Af Amer: >60 mL/min (ref >60)
Glucose, Bld: 75 mg/dL (ref 70–99)
Potassium: 4.6 mmol/L (ref 3.5–5.1)
Sodium: 136 mmol/L (ref 135–145)

## 2019-10-07 LAB — MAGNESIUM: Magnesium: 2 mg/dL (ref 1.7–2.4)

## 2019-10-07 LAB — T3, FREE: T3, Free: 3.6 pg/mL (ref 2.0–4.4)

## 2019-10-07 LAB — T3: T3, Total: 123 ng/dL (ref 71–180)

## 2019-10-07 MED ORDER — METHIMAZOLE 10 MG PO TABS: 10.0000 mg | ORAL_TABLET | Freq: Two times a day (BID) | ORAL | 1 refills | Status: DC

## 2019-10-07 MED ORDER — FUROSEMIDE 20 MG PO TABS
20.0000 mg | ORAL_TABLET | Freq: Every day | ORAL | 3 refills | Status: DC | PRN
Start: 2019-10-07 — End: 2019-12-31

## 2019-10-07 MED ORDER — FUROSEMIDE 20 MG PO TABS
20.0000 mg | ORAL_TABLET | Freq: Once | ORAL | Status: AC
  Administered 2019-10-07: 20 mg via ORAL
  Filled 2019-10-07: qty 1

## 2019-10-07 MED ORDER — FUROSEMIDE 20 MG PO TABS: 20.0000 mg | ORAL_TABLET | Freq: Every day | ORAL | Status: DC

## 2019-10-07 MED ORDER — POTASSIUM CHLORIDE ER 10 MEQ PO TBCR: 10.0000 meq | EXTENDED_RELEASE_TABLET | Freq: Every day | ORAL | 3 refills | Status: DC | PRN

## 2019-10-07 MED ORDER — ATENOLOL 50 MG PO TABS: 50.0000 mg | ORAL_TABLET | Freq: Two times a day (BID) | ORAL | 5 refills | Status: DC

## 2019-10-07 NOTE — Addendum Note (Signed)
Addended by: Barrie Dunker on: 10/07/2019 11:53 AM   Modules accepted: Orders

## 2019-10-07 NOTE — Plan of Care (Signed)
Problem: Education: Goal: Knowledge of disease or condition will improve 10/07/2019 1513 by Luna Kitchens, RN Outcome: Adequate for Discharge 10/07/2019 1227 by Luna Kitchens, RN Outcome: Progressing Goal: Understanding of medication regimen will improve 10/07/2019 1513 by Luna Kitchens, RN Outcome: Adequate for Discharge 10/07/2019 1227 by Luna Kitchens, RN Outcome: Progressing Goal: Individualized Educational Video(s) 10/07/2019 1513 by Luna Kitchens, RN Outcome: Adequate for Discharge 10/07/2019 1227 by Luna Kitchens, RN Outcome: Progressing   Problem: Activity: Goal: Ability to tolerate increased activity will improve 10/07/2019 1513 by Luna Kitchens, RN Outcome: Adequate for Discharge 10/07/2019 1227 by Luna Kitchens, RN Outcome: Progressing   Problem: Cardiac: Goal: Ability to achieve and maintain adequate cardiopulmonary perfusion will improve 10/07/2019 1513 by Luna Kitchens, RN Outcome: Adequate for Discharge 10/07/2019 1227 by Luna Kitchens, RN Outcome: Progressing   Problem: Health Behavior/Discharge Planning: Goal: Ability to safely manage health-related needs after discharge will improve 10/07/2019 1513 by Luna Kitchens, RN Outcome: Adequate for Discharge 10/07/2019 1227 by Luna Kitchens, RN Outcome: Progressing   Problem: Education: Goal: Knowledge of General Education information will improve Description: Including pain rating scale, medication(s)/side effects and non-pharmacologic comfort measures 10/07/2019 1513 by Luna Kitchens, RN Outcome: Adequate for Discharge 10/07/2019 1227 by Luna Kitchens, RN Outcome: Progressing   Problem: Education: Goal: Knowledge of General Education information will improve Description: Including pain rating scale, medication(s)/side effects and non-pharmacologic comfort measures 10/07/2019 1513 by Luna Kitchens, RN Outcome: Adequate for Discharge 10/07/2019 1227 by Luna Kitchens, RN Outcome: Progressing   Problem: Health Behavior/Discharge  Planning: Goal: Ability to manage health-related needs will improve 10/07/2019 1513 by Luna Kitchens, RN Outcome: Adequate for Discharge 10/07/2019 1227 by Luna Kitchens, RN Outcome: Progressing   Problem: Clinical Measurements: Goal: Ability to maintain clinical measurements within normal limits will improve 10/07/2019 1513 by Luna Kitchens, RN Outcome: Adequate for Discharge 10/07/2019 1227 by Luna Kitchens, RN Outcome: Progressing Goal: Will remain free from infection 10/07/2019 1513 by Luna Kitchens, RN Outcome: Adequate for Discharge 10/07/2019 1227 by Luna Kitchens, RN Outcome: Progressing Goal: Diagnostic test results will improve 10/07/2019 1513 by Luna Kitchens, RN Outcome: Adequate for Discharge 10/07/2019 1227 by Luna Kitchens, RN Outcome: Progressing Goal: Respiratory complications will improve 10/07/2019 1513 by Luna Kitchens, RN Outcome: Adequate for Discharge 10/07/2019 1227 by Luna Kitchens, RN Outcome: Progressing Goal: Cardiovascular complication will be avoided 10/07/2019 1513 by Luna Kitchens, RN Outcome: Adequate for Discharge 10/07/2019 1227 by Luna Kitchens, RN Outcome: Progressing   Problem: Activity: Goal: Risk for activity intolerance will decrease 10/07/2019 1513 by Luna Kitchens, RN Outcome: Adequate for Discharge 10/07/2019 1227 by Luna Kitchens, RN Outcome: Progressing   Problem: Nutrition: Goal: Adequate nutrition will be maintained 10/07/2019 1513 by Luna Kitchens, RN Outcome: Adequate for Discharge 10/07/2019 1227 by Luna Kitchens, RN Outcome: Progressing   Problem: Coping: Goal: Level of anxiety will decrease 10/07/2019 1513 by Luna Kitchens, RN Outcome: Adequate for Discharge 10/07/2019 1227 by Luna Kitchens, RN Outcome: Progressing   Problem: Elimination: Goal: Will not experience complications related to bowel motility 10/07/2019 1513 by Luna Kitchens, RN Outcome: Adequate for Discharge 10/07/2019 1227 by Luna Kitchens, RN Outcome: Progressing Goal: Will not  experience complications related to urinary retention 10/07/2019 1513 by Luna Kitchens, RN Outcome: Adequate for Discharge 10/07/2019 1227 by Luna Kitchens, RN Outcome: Progressing   Problem: Pain Managment: Goal: General experience of comfort will improve 10/07/2019 1513 by Luna Kitchens, RN Outcome: Adequate for Discharge 10/07/2019 1227 by Luna Kitchens, RN Outcome: Progressing   Problem: Safety: Goal: Ability to remain free  from injury will improve 10/07/2019 1513 by Shanon Ace, RN Outcome: Adequate for Discharge 10/07/2019 1227 by Shanon Ace, RN Outcome: Progressing   Problem: Skin Integrity: Goal: Risk for impaired skin integrity will decrease 10/07/2019 1513 by Shanon Ace, RN Outcome: Adequate for Discharge 10/07/2019 1227 by Shanon Ace, RN Outcome: Progressing

## 2019-10-07 NOTE — Plan of Care (Signed)

## 2019-10-07 NOTE — Telephone Encounter (Signed)
   Routing to NL [facility] and NL Triage (In case [facility] pool isn't active). This is a patient of Dr. Antoine Poche that we are discharging today. She was found to have hyperthyroidism and internal medicine team helped manage while in the hospital. They spoke with Dr. Carlus Pavlov, endocrinologist at [facility], who gave recommendations for medication. They advised f/u in 4 weeks for appointment with her. I tried to call [facility] but only got the hold music/hold message for over 15 minutes. Each time I tried to call back, same thing. Can you please help [place] get this patient an appointment with their office in 4 weeks? Please call the patient with that information as well. Thank you!  Shaeley Segall PA-C

## 2019-10-07 NOTE — Progress Notes (Signed)
Pt discharged home with daughter.

## 2019-10-07 NOTE — Discharge Summary (Addendum)
Discharge Summary    Patient ID: Lynn Hayden MRN: [id number]; DOB: [birth year]  Admit date: 09/30/2019 Discharge date: 10/07/2019  Primary Care Provider: No primary care provider on file.  Primary Cardiologist: Rollene Rotunda, MD  Primary Electrophysiologist:  None   Discharge Diagnoses    Principal Problem:   Persistent atrial fibrillation with rapid ventricular response (HCC) Active Problems:   Insomnia   Hyperthyroidism   Prediabetes   Hypokalemia   Hypomagnesemia   Pulmonary hypertension, unspecified (HCC)  Diagnostic Studies/Procedures    Limited Echo 10/01/19 1. Left ventricular ejection fraction, by estimation, is 60 to 65%. The  left ventricle has normal function. The left ventricle has no regional  wall motion abnormalities. Unable to assess LV diastolic filling due to  underlying atrial flutter.  2. Right ventricular systolic function is mildly reduced. The right  ventricular size is mildly enlarged. There is moderately elevated  pulmonary artery systolic pressure. The estimated right ventricular  systolic pressure is 61.3 mmHg.  3. Left atrial size was severely dilated.  4. Right atrial size was severely dilated.  5. The mitral valve is normal in structure. Mild to moderate mitral valve  regurgitation. No evidence of mitral stenosis.  6. Tricuspid valve regurgitation is moderate to severe.  7. The aortic valve is tricuspid. Aortic valve regurgitation is mild.  Mild to moderate aortic valve sclerosis/calcification is present, without  any evidence of aortic stenosis.  8. The inferior vena cava is dilated in size with >50% respiratory  variability, suggesting right atrial pressure of 8 mmHg.  _____________   History of Present Illness     Lynn Hayden is a 79 y.o. female with recently diagnosed atrial fibrillation, arthritis, hemorrhoid, IBS, osteoporosis, vitamin D deficiency who presented to [facility] as a direct admission from the office  for worsening dyspnea over the preceding two weeks.  She carries a history of hypertrophic cardiomyopathy (mild septal focal hypertrophy) by echo in 2015 but this has not been seen on more recent echoes. Per more recent history, she was seen by Dr. Antoine Poche in 06/2019 with sensation of quivering in her chest and chest pressure. Echo and ETT were ordered. 2D Echo 07/11/19 showed EF 55-60%, normal septal and posterior wall thickness, no LVH, no intracavitary gradient, normal RV, moderate LAE, trivial pericardial effusion, moderately elevated PASP. She was then seen in the ED 08/2019 with tachycardia and found to be in atrial fibrillation with RVR. She was given medications for rate control and Eliquis and converted spontaneously. ETT was ordered but does not appear this was performed because she was unable to fully quarantine for this due to taking care of her disabled son. The order was changed to [facility]. Before this could be completed, she was seen in the ED 08/2019 with fatigue, dizziness, SOB and found to have new onset atrial fibrillation with RVR. She was placed on rate controlling agents and Eliquis and spontaneously converted to NSR. A follow-up NM stress test was completed on 08/29/2019 and was low risk. She was in atrial fib during this study. She presented to the office 09/30/19 for follow-up reporting worsening dypsnea for 2 weeks. Office note indicates her Eliquis had been reduced to 2.5mg  BID due to weight <50kg but I cannot find further documentation on this. Eliquis  BID was reported on med list. She was found to be in rapid atrial fibrillation again and was therefore sent to ED for admission. She was placed on IV Cardizem and admitted for further workup. She was  found to have new onset hyperthyroidism.  Hospital Course     1.  Atrial fibrillation with RVR  - likely driven by new onset hyperthyroidism - TSH was severely suppressed - failed cardioversion in the ED - AVN blocking agents were  titrated with co-management of thyroid disease (at one point was on IV diltiazem, atenolol, and digoxin) - there was originally plan to re-attempt DCCV but patient spontaneously converted to NSR the evening of 10/05/19 - per Dr. Mayford Knife, she will continue Eliquis and atenolol at discharge (beta blocker chosen in lieu of diltiazem due to hyperthyroidism)  2.  Chronic diastolic CHF with pulmonary HTN - 2D echo repeated to make sure EF was still preserved given persistent arrhythmia - EF was 60-65%, mildly reduced RV function with mildly enlarged RV, moderately elevated PASP, severe biatrial enlargement, mild-moderate MR, moderate-severe tricuspid regurgitation - treated with sporadic oral and IV Lasix dosing while inpatient for volume optimization with weight 116 -> 106lb and clinical improvement - upon review it appears patient has been refusing oral Lasix for several days. She still has standing potassium dosing that was ordered for hypokalemia that occurred after receiving IV Lasix several days ago. Potassium is now 4.6 today. She did take 1/2 of her potassium supplement ( ) this morning per discussion with nursing - per Dr. Mayford Knife, will give Lasix 20mg  x 1 today prior to discharge to offload the potassium supplementation. The patient is not eager to continue standing Lasix. She did request rx for PRN Lasix which we provided with parameters for use along with low dose KCl ( ) - workup of pulmonary HTN as outpatient  3.  Hyperthyroidism -TSH suppressed at <0.01 -T3 elevated at 313 with free T4 of 3.62 -positive thyroid-stimulatingimmunoglobulinat 7.24  - [facility] helped assist with management - she was started on Tapazole 10mg  TID as well as SSKI  - per IM note, plan to continue Tapazole at discharge, discontinue SSKI at time of discharge, and follow-up with Dr. in 4 weeks -free T4 is trending down with treatment - confirmed with [facility] have med in stock  4.  Hypokalemia/hypomagnesemia - occurred in context of diuresis - monitored and repleted during admission - f/u potassium 4.6, Mg 2.0 today - see #2 - consider recheck as outpatient to trend  Dr. has seen and examined the patient today and feels she is stable for discharge. We have arranged f/u with the afib clinic in 1 week and Dr. Elvera Lennox in 2-3 weeks. IM recommends f/u with Dr. Mayford Knife in 4 weeks for appointment with her. I tried to call [facility] but only got the hold music/hold message for over 15 minutes. I have routed request for our office to help assist in scheduling this appointment and notifying patient of appointment details. She declined TOC pharmacy.  Did the patient have an acute coronary syndrome (MI, NSTEMI, STEMI, etc) this admission?:  No                               Did the patient have a percutaneous coronary intervention (stent / angioplasty)?:  No.   _____________  Discharge Vitals Blood pressure (!) 118/57, pulse 65, temperature 98 F (36.7 C), temperature source Oral, resp. rate 18, height 5\' 4"  (1.626 m), weight 48.2 kg, SpO2 97 %.  Filed Weights   10/05/19 0535 10/06/19 0411 10/07/19 0500  Weight: 49.7 kg 48.6 kg 48.2 kg    Labs & Radiologic Studies  CBC Recent Labs    10/05/19 0220  WBC 6.3  NEUTROABS 3.4  HGB 12.2  HCT 37.4  MCV 85.4  PLT 209   Basic Metabolic Panel Recent Labs    29/79/89 0230 10/07/19 1010  NA 140 136  K 3.3* 4.6  CL 104 106  CO2 27 26  GLUCOSE 93 75  BUN 22 17  CREATININE 1.07* 0.73  CALCIUM 8.4* 8.8*  MG 1.7 2.0   Liver Function Tests Recent Labs    10/05/19 0220  AST 15  ALT 18  ALKPHOS 83  BILITOT 1.2  PROT 5.3*  ALBUMIN 2.6*   No results for input(s): LIPASE, AMYLASE in the last 72 hours. High Sensitivity Troponin:   Recent Labs  Lab 09/30/19 1715 09/30/19 1840  TROPONINIHS 12 13    Thyroid Function Tests Recent Labs    10/06/19 0230  T3FREE 3.6   _____________  DG Chest  Port 1 View  Result Date: 09/30/2019 CLINICAL DATA:  Dyspnea on exertion. EXAM: PORTABLE CHEST 1 VIEW COMPARISON:  August 25, 2019. FINDINGS: Stable cardiomediastinal silhouette. No pneumothorax is noted. Minimal bibasilar subsegmental atelectasis is noted. Small left pleural effusion is noted. Bony thorax is unremarkable. IMPRESSION: Minimal bibasilar subsegmental atelectasis. Small left pleural effusion. Electronically Signed   By: Lupita Raider M.D.   On: 09/30/2019 16:57   ECHOCARDIOGRAM LIMITED  Result Date: 10/01/2019    ECHOCARDIOGRAM REPORT   Patient Name:   Lynn Hayden Date of Exam: 10/01/2019 Medical Rec #:  [id number]      Height:       64.0 in Accession #:    [id number]     Weight:       114.6 lb Date of Birth:  29-Dec-1940      BSA:          1.544 m Patient Age:    79 years       BP:           111/69 mmHg Patient Gender: F              HR:           82 bpm. Exam Location:  Inpatient Procedure: Limited Color Doppler, Cardiac Doppler and Limited Echo Indications:    dyspnea 786.09  History:        Patient has prior history of Echocardiogram examinations, most                 recent 07/11/2019. Arrythmias:Atrial Fibrillation;                 Signs/Symptoms:Murmur.  Sonographer:    Delcie Roch Referring Phys: [id number] JAMES HOCHREIN IMPRESSIONS  1. Left ventricular ejection fraction, by estimation, is 60 to 65%. The left ventricle has normal function. The left ventricle has no regional wall motion abnormalities. Unable to assess LV diastolic filling due to underlying atrial flutter.  2. Right ventricular systolic function is mildly reduced. The right ventricular size is mildly enlarged. There is moderately elevated pulmonary artery systolic pressure. The estimated right ventricular systolic pressure is 61.3 mmHg.  3. Left atrial size was severely dilated.  4. Right atrial size was severely dilated.  5. The mitral valve is normal in structure. Mild to moderate mitral valve regurgitation. No evidence  of mitral stenosis.  6. Tricuspid valve regurgitation is moderate to severe.  7. The aortic valve is tricuspid. Aortic valve regurgitation is mild. Mild to moderate aortic valve sclerosis/calcification is present, without any evidence of aortic stenosis.  8. The inferior vena cava is dilated in size with >50% respiratory variability, suggesting right atrial pressure of 8 mmHg. FINDINGS  Left Ventricle: Left ventricular ejection fraction, by estimation, is 60 to 65%. The left ventricle has normal function. The left ventricle has no regional wall motion abnormalities. The left ventricular internal cavity size was normal in size. There is  no left ventricular hypertrophy. Unable to assess LV diastolic filling due to underlying atrial flutter. Right Ventricle: The right ventricular size is mildly enlarged. No increase in right ventricular wall thickness. Right ventricular systolic function is mildly reduced. There is moderately elevated pulmonary artery systolic pressure. The tricuspid regurgitant velocity is 3.65 m/s, and with an assumed right atrial pressure of 8 mmHg, the estimated right ventricular systolic pressure is 73.7 mmHg. Left Atrium: Left atrial size was severely dilated. Right Atrium: Right atrial size was severely dilated. Pericardium: There is no evidence of pericardial effusion. Mitral Valve: The mitral valve is normal in structure. There is mild thickening of the mitral valve leaflet(s). Normal mobility of the mitral valve leaflets. Mild to moderate mitral valve regurgitation. No evidence of mitral valve stenosis. Tricuspid Valve: The tricuspid valve is normal in structure. Tricuspid valve regurgitation is moderate to severe. No evidence of tricuspid stenosis. Aortic Valve: The aortic valve is tricuspid. Aortic valve regurgitation is mild. Mild to moderate aortic valve sclerosis/calcification is present, without any evidence of aortic stenosis. Pulmonic Valve: The pulmonic valve was normal in  structure. Pulmonic valve regurgitation is trivial. No evidence of pulmonic stenosis. Aorta: The aortic root is normal in size and structure. Venous: The inferior vena cava is dilated in size with greater than 50% respiratory variability, suggesting right atrial pressure of 8 mmHg. IAS/Shunts: No atrial level shunt detected by color flow Doppler.  LEFT VENTRICLE PLAX 2D LVIDd:         3.60 cm LVIDs:         2.80 cm LV PW:         0.80 cm LV IVS:        0.80 cm LVOT diam:     1.60 cm LVOT Area:     2.01 cm  LEFT ATRIUM         Index LA diam:    4.00 cm 2.59 cm/m   AORTA Ao Root diam: 2.50 cm Ao Asc diam:  2.90 cm TRICUSPID VALVE TR Peak grad:   53.3 mmHg TR Vmax:        365.00 cm/s  SHUNTS Systemic Diam: 1.60 cm Fransico Him MD Electronically signed by Fransico Him MD Signature Date/Time: 10/01/2019/3:56:36 PM    Final    Disposition   Pt is being discharged home today in good condition.  Follow-up Plans & Appointments    Follow-up Information    Philemon Kingdom, MD Follow up in 4 week(s).   Specialty: Internal Medicine Why: for hyperthyroidism - we have asked our office to help you schedule this. Please call Dr. Rosezella [place] office if you have not heard anything about an appointment within a few days. Contact information: [address] [place] [address] [place] Follow up.   Specialty: Cardiology Why: We have arranged follow-up in the atrial fibrillation clinic at [facility] on Tuesday Oct 14, 2019 9:30 AM. Please call the office number provided for directions/instructions for parking garage.  Contact information: [address] [phone] [place] [phone]  Rollene Rotunda, MD Follow up.   Specialty: Cardiology Why: See follow-up appointments listed below - we have scheduled you to see Dr. Antoine Poche on Thursday Oct 23, 2019 at 9:20 AM (Arrive by 9:05  AM). Contact information: [address] [place] [address] [phone]          Discharge Instructions    Diet - low sodium heart healthy   Complete by: As directed    Discharge instructions   Complete by: As directed    Your diltiazem was stopped and changed to atenolol.  You were started on a prescription medication called methimazone (Tapazole) for your overactive thyroid. The endocrinologist will give you further instructions at your follow-up visit with them.  You were also started on a prescription for as-needed Lasix for fluid retention. You should also take a potassium tablet when you take Lasix.  Odansetron (Zofran) was removed from your medicine list since you indicated you are no longer taking it.  For patients with history of fluid retention (which you were treated for this admission), we give them these special instructions:  1. Follow a low-salt diet - you are allowed no more than 2,000mg  of sodium per day. Watch your fluid intake. In general, you should not be taking in more than 2 liters of fluid per day (no more than 8 glasses per day). This includes sources of water in foods like soup, coffee, tea, milk, etc. 2. Weigh yourself on the same scale at same time of day and keep a log. 3. Call your doctor: (Anytime you feel any of the following symptoms)  - 3lb weight gain overnight or 5lb within a few days - Shortness of breath, with or without a dry hacking cough  - Swelling in the hands, feet or stomach  - If you have to sleep on extra pillows at night in order to breathe   IT IS IMPORTANT TO LET YOUR DOCTOR KNOW EARLY ON IF YOU ARE HAVING SYMPTOMS SO WE CAN HELP YOU!   Increase activity slowly   Complete by: As directed       Discharge Medications   Allergies as of 10/07/2019      Reactions   Amoxicillin Rash      Medication List    STOP taking these medications   diltiazem 120 MG 24 hr capsule Commonly known as: CARDIZEM CD    ondansetron 4 MG disintegrating tablet Commonly known as: ZOFRAN-ODT     TAKE these medications   apixaban 5 MG Tabs tablet Commonly known as: ELIQUIS Take 1 tablet (5 mg total) by mouth 2 (two) times daily.   atenolol 50 MG tablet Commonly known as: TENORMIN Take 1 tablet (50 mg total) by mouth 2 (two) times daily.   Calcium Carb-Cholecalciferol [phone] MG-UNIT Tabs Take 2 tablets by mouth daily.   diphenoxylate-atropine 2.5-0.025 MG tablet Commonly known as: LOMOTIL Take 1 tablet by mouth as needed for diarrhea or loose stools.   furosemide 20 MG tablet Commonly known as: Lasix Take 1 tablet (20 mg total) by mouth daily as needed (for fluid retention, or weight gain of 3-5lb).   methimazole 10 MG tablet Commonly known as: TAPAZOLE Take 1 tablet (10 mg total) by mouth 2 (two) times daily.   potassium chloride 10 MEQ tablet Commonly known as: KLOR-CON Take 1 tablet (10 mEq total) by mouth daily as needed (Take 1 tablet when taking a dose of Lasix/furosemide.).   temazepam 15 MG capsule Commonly known as: RESTORIL Take 15 mg  by mouth at bedtime as needed for sleep.   Vitamin D (Cholecalciferol) 25 MCG (1000 UT) Caps Take 1,000 Units by mouth daily.          Outstanding Labs/Studies   Will need outpatient management of thyroid function  Duration of Discharge Encounter   Greater than 30 minutes including physician time.  Signed, Laurann Montanaayna N Dilana Mcphie, PA-C 10/07/2019, 1:56 PM

## 2019-10-07 NOTE — Progress Notes (Addendum)
Progress Note  Patient Name: Lynn Hayden Date of Encounter: 10/07/2019  Primary Cardiologist:   Minus Breeding, MD   Subjective   Maintaining NSR on tele.  Denies any chest pain or SOB.   Inpatient Medications    Scheduled Meds: . apixaban  5 mg Oral BID  . atenolol  50 mg Oral BID  . calcium carbonate  2 tablet Oral Daily  . cholecalciferol  1,000 Units Oral Daily  . furosemide  40 mg Oral Daily  . methimazole  10 mg Oral BID  . potassium chloride  40 mEq Oral Daily  . potassium iodide  150 mg Oral Q12H  . sodium chloride flush  3 mL Intravenous Q12H   Continuous Infusions: . sodium chloride     PRN Meds: sodium chloride, acetaminophen, ALPRAZolam, loperamide, nitroGLYCERIN, ondansetron (ZOFRAN) IV, ondansetron, sodium chloride flush, temazepam, zolpidem   Vital Signs    Vitals:   10/07/19 0300 10/07/19 0500 10/07/19 0657 10/07/19 0800  BP:   126/73   Pulse:      Resp: 18 20  (!) 21  Temp:   (!) 97.4 F (36.3 C)   TempSrc:   Oral   SpO2:   94%   Weight:  48.2 kg    Height:        Intake/Output Summary (Last 24 hours) at 10/07/2019 0825 Last data filed at 10/06/2019 2214 Gross per 24 hour  Intake 540 ml  Output --  Net 540 ml   Filed Weights   10/05/19 0535 10/06/19 0411 10/07/19 0500  Weight: 49.7 kg 48.6 kg 48.2 kg    Telemetry    NSR - Personally Reviewed  ECG    NSR with PVC and nonspecific ST abnormality  - Personally Reviewed  Physical Exam   GEN: Well nourished, well developed in no acute distress HEENT: Normal NECK: No JVD; No carotid bruits LYMPHATICS: No lymphadenopathy CARDIAC:RRR, no murmurs, rubs, gallops RESPIRATORY:  Clear to auscultation without rales, wheezing or rhonchi  ABDOMEN: Soft, non-tender, non-distended MUSCULOSKELETAL:  No edema; No deformity  SKIN: Warm and dry NEUROLOGIC:  Alert and oriented x 3 PSYCHIATRIC:  Normal affect    Labs    Chemistry Recent Labs  Lab 09/30/19 1950 10/01/19 0804  10/04/19 0143 10/05/19 0220 10/06/19 0230  NA  --    < > 137 141 140  K  --    < > 3.2* 3.9 3.3*  CL  --    < > 103 108 104  CO2  --    < > 23 23 27   GLUCOSE  --    < > 94 100* 93  BUN  --    < > 27* 21 22  CREATININE  --    < > 0.82 0.83 1.07*  CALCIUM  --    < > 8.5* 8.7* 8.4*  PROT 5.9*  --   --  5.3*  --   ALBUMIN 3.1*  --   --  2.6*  --   AST 23  --   --  15  --   ALT 19  --   --  18  --   ALKPHOS 89  --   --  83  --   BILITOT 1.4*  --   --  1.2  --   GFRNONAA  --    < > >60 >60 49*  GFRAA  --    < > >60 >60 57*  ANIONGAP  --    < > 11 10  9   < > = values in this interval not displayed.     Hematology Recent Labs  Lab 09/30/19 1715 10/05/19 0220  WBC 5.5 6.3  RBC 4.42 4.38  HGB 12.2 12.2  HCT 37.9 37.4  MCV 85.7 85.4  MCH 27.6 27.9  MCHC 32.2 32.6  RDW 13.8 14.1  PLT 198 209    Cardiac EnzymesNo results for input(s): TROPONINI in the last 168 hours. No results for input(s): TROPIPOC in the last 168 hours.   BNP Recent Labs  Lab 09/30/19 1950  BNP 433.8*     DDimer No results for input(s): DDIMER in the last 168 hours.   Radiology    No results found.  Cardiac Studies   Echocardiogram 09/30/19 1. Left ventricular ejection fraction, by estimation, is 60 to 65%. The  left ventricle has normal function. The left ventricle has no regional  wall motion abnormalities. Unable to assess LV diastolic filling due to  underlying atrial flutter.  2. Right ventricular systolic function is mildly reduced. The right  ventricular size is mildly enlarged. There is moderately elevated  pulmonary artery systolic pressure. The estimated right ventricular  systolic pressure is 61.3 mmHg.  3. Left atrial size was severely dilated.  4. Right atrial size was severely dilated.  5. The mitral valve is normal in structure. Mild to moderate mitral valve  regurgitation. No evidence of mitral stenosis.  6. Tricuspid valve regurgitation is moderate to severe.  7. The  aortic valve is tricuspid. Aortic valve regurgitation is mild.  Mild to moderate aortic valve sclerosis/calcification is present, without  any evidence of aortic stenosis.  8. The inferior vena cava is dilated in size with >50% respiratory  variability, suggesting right atrial pressure of 8 mmHg.  Patient Profile     79 y.o. female who presents for evaluation of atrial fib with RVR  Assessment & Plan     1.  ATRIAL FIB WITH RVR:     -she has converted to NSR and has been maintaining NSR on tele -continue Apixaban 5mg  BID and Atenolol 50mg  BID  2.  Chronic diastolic CHF:   -EF is preserved.   -appears euvolemic on exam -decrease Lasix to 20mg  daily PO -labs pending this am  3.  DECREASED TSH:      -TSH suppressed at <0.01  -T3 elevated at 313 with free T4 of 3.62  -positive thyroid-stimulating immunoglobulin at 7.24  -Dr. has agreed with Tapazole 10 mg twice daily as well as SSKI on Saturdays and Sundays  -continue Tapazole at discharge and follow-up with Dr. Elvera Lennox in 4 weeks  -free T4 is trending down with treatment -This is being managed by [facility].   4. Hypokalemia -K+ 3.3 yesterday and repleted with Kdur 08-31-1990 daily -BMET pending this am  5.  Hypomagnesemia -repleted mag yesterday -Mag level pending this am  She is stable for discharge home but sill awaiting BMET. Will have her followup in afib clinic next week and with Dr. 09-17-1973 in 2-3 weeks.  For questions or updates, please contact [facility] Please consult [email or web address] for contact info under Cardiology/STEMI.   Signed, Elvera Lennox, MD  10/07/2019, 8:25 AM

## 2019-10-09 ENCOUNTER — Telehealth: Payer: Self-pay | Admitting: Cardiology

## 2019-10-09 DIAGNOSIS — E0590 Thyrotoxicosis, unspecified without thyrotoxic crisis or storm: Secondary | ICD-10-CM

## 2019-10-09 NOTE — Telephone Encounter (Signed)
Referral was not ordered per Epic chart review Order has been placed Routed to primary nurse & [facility] pool to assist with referral to endocrinologist

## 2019-10-09 NOTE — Telephone Encounter (Signed)
Referral has been ordered in Epic and routed to primary nurse & [facility] pool via another encounter

## 2019-10-09 NOTE — Telephone Encounter (Signed)
New message   Patient states that she needs a referral for the below provider:   1 Follow up with Lynn Pavlov, MD (Internal Medicine) in 4 weeks (11/04/2019); for hyperthyroidism - we have asked our office to help you schedule this. Please call Dr. Jenene Slicker office if you have not heard anything about an appointment within a few days.   Please advise.

## 2019-10-14 ENCOUNTER — Other Ambulatory Visit: Payer: Self-pay

## 2019-10-14 ENCOUNTER — Encounter (HOSPITAL_COMMUNITY): Payer: Self-pay | Admitting: Physician Assistant

## 2019-10-14 ENCOUNTER — Ambulatory Visit (HOSPITAL_COMMUNITY)
Admit: 2019-10-14 | Discharge: 2019-10-14 | Disposition: A | Discharge status: Home / Self Care | Payer: Medicare HMO | Source: Ambulatory Visit | Attending: Physician Assistant | Admitting: Physician Assistant

## 2019-10-14 VITALS — BP 130/70 | HR 52 | Ht 64.0 in | Wt 109.6 lb

## 2019-10-14 DIAGNOSIS — I517 Cardiomegaly: Secondary | ICD-10-CM | POA: Diagnosis not present

## 2019-10-14 DIAGNOSIS — Z87891 Personal history of nicotine dependence: Secondary | ICD-10-CM | POA: Insufficient documentation

## 2019-10-14 DIAGNOSIS — E0590 Thyrotoxicosis, unspecified without thyrotoxic crisis or storm: Secondary | ICD-10-CM | POA: Insufficient documentation

## 2019-10-14 DIAGNOSIS — R001 Bradycardia, unspecified: Secondary | ICD-10-CM | POA: Diagnosis not present

## 2019-10-14 DIAGNOSIS — I5032 Chronic diastolic (congestive) heart failure: Secondary | ICD-10-CM | POA: Insufficient documentation

## 2019-10-14 DIAGNOSIS — Z79899 Other long term (current) drug therapy: Secondary | ICD-10-CM | POA: Insufficient documentation

## 2019-10-14 DIAGNOSIS — Z7901 Long term (current) use of anticoagulants: Secondary | ICD-10-CM | POA: Insufficient documentation

## 2019-10-14 DIAGNOSIS — I480 Paroxysmal atrial fibrillation: Secondary | ICD-10-CM | POA: Insufficient documentation

## 2019-10-14 DIAGNOSIS — D6869 Other thrombophilia: Secondary | ICD-10-CM | POA: Insufficient documentation

## 2019-10-14 DIAGNOSIS — I272 Pulmonary hypertension, unspecified: Secondary | ICD-10-CM | POA: Diagnosis not present

## 2019-10-14 DIAGNOSIS — Z8249 Family history of ischemic heart disease and other diseases of the circulatory system: Secondary | ICD-10-CM | POA: Insufficient documentation

## 2019-10-14 NOTE — Progress Notes (Signed)
Primary Care Physician: No primary care provider on file. Primary Cardiologist: Dr Percival Spanish Primary Electrophysiologist: none Referring Physician: Melina Copa PA   Lynn Hayden is a 79 y.o. female with a history of paroxysmal atrial fibrillation and asymmetric septal hypertrophy who presents for consultation in the [facility]. The patient was initially diagnosed with atrial fibrillation 08/25/19 after presenting to the ED with symptoms of weakness and palpitations. She converted to SR on diltiazem drip. Patient is on Eliquis for a CHADS2VASC score of 3. Patient was seen 09/30/19 and found to be in afib RVR again and was sent to the ED for DCCV. Unfortunately, she failed to convert and was admitted. Her TSH was noted to be <0.01 and she was started on methimazole. She converted to SR without DCCV. Patient reports she has felt well since her hospitalization and is back to her normal activity. She denies any further heart racing or palpitations. She denies bleeding issues on anticoagulation.   Today, she denies symptoms of palpitations, chest pain, shortness of breath, orthopnea, PND, lower extremity edema, dizziness, presyncope, syncope, snoring, daytime somnolence, bleeding, or neurologic sequela. The patient is tolerating medications without difficulties and is otherwise without complaint today.    Atrial Fibrillation Risk Factors:  she does not have symptoms or diagnosis of sleep apnea. she does not have a history of rheumatic fever. she does not have a history of alcohol use.   she has a BMI of Body mass index is 18.81 kg/m.Marland Kitchen Filed Weights   10/14/19 0945  Weight: 49.7 kg    Family History  Problem Relation Age of Onset  . Vascular Disease Mother   . Diabetes Father   . Dementia Father   . Lymphoma Sister      Atrial Fibrillation Management history:  Previous antiarrhythmic drugs: none Previous cardioversions: 09/30/19 Previous ablations:  none CHADS2VASC score: 3 Anticoagulation history: Eliquis   Past Medical History:  Diagnosis Date  . Arthritis   . Chronic diarrhea   . Chronic diastolic CHF (congestive heart failure) ([place])   . Hemorrhoid   . Hyperthyroidism   . Hypertrophic cardiomegaly    a. history of hypertrophic cardiomyopathy (mild septal focal hypertrophy) by echo in 2015 but this has not been seen on more recent echoes.  . IBS (irritable bowel syndrome)   . Insomnia   . Miscarriage   . Osteoporosis   . Persistent atrial fibrillation ([place])   . Pulmonary hypertension ([place])   . Thyromegaly   . Vitamin D deficiency    Past Surgical History:  Procedure Laterality Date  . CATARACT EXTRACTION    . DILATION AND CURETTAGE OF UTERUS    . EAR CYST EXCISION  11/09/2011   Procedure: CYST REMOVAL;  Surgeon: Tennis Must, MD;  Location: [facility];  Service: Orthopedics;  Laterality: Right;  right index excision cyst/foreign body and debridement DIP joint  . GANGLION CYST EXCISION     rt wrist  . GANGLION CYST EXCISION    . TONSILLECTOMY    . WISDOM TOOTH EXTRACTION      Current Outpatient Medications  Medication Sig Dispense Refill  . apixaban (ELIQUIS) 5 MG TABS tablet Take 1 tablet (5 mg total) by mouth 2 (two) times daily. 180 tablet 3  . atenolol (TENORMIN) 50 MG tablet Take 1 tablet (50 mg total) by mouth 2 (two) times daily. 60 tablet 5  . Calcium Carb-Cholecalciferol [phone] MG-UNIT TABS Take 2 tablets by mouth daily.     Marland Kitchen  diphenoxylate-atropine (LOMOTIL) 2.5-0.025 MG per tablet Take 1 tablet by mouth as needed for diarrhea or loose stools. 30 tablet 4  . furosemide (LASIX) 20 MG tablet Take 1 tablet (20 mg total) by mouth daily as needed (for fluid retention, or weight gain of 3-5lb). 30 tablet 3  . methimazole (TAPAZOLE) 10 MG tablet Take 1 tablet (10 mg total) by mouth 2 (two) times daily. 60 tablet 1  . potassium chloride (KLOR-CON) 10 MEQ tablet Take 1 tablet (10 mEq total) by mouth  daily as needed (Take 1 tablet when taking a dose of Lasix/furosemide.). 30 tablet 3  . temazepam (RESTORIL) 15 MG capsule Take 15 mg by mouth at bedtime as needed for sleep.    . Vitamin D, Cholecalciferol, 25 MCG (1000 UT) CAPS Take 1,000 Units by mouth daily.     No current facility-administered medications for this encounter.    Allergies  Allergen Reactions  . Amoxicillin Rash    Social History   Socioeconomic History  . Marital status: Married    Spouse name: Not on file  . Number of children: 3  . Years of education: Not on file  . Highest education level: Not on file  Occupational History    Comment: retired  Tobacco Use  . Smoking status: Former Smoker    Quit date: 11/06/1988    Years since quitting: 30.9  . Smokeless tobacco: Never Used  Substance and Sexual Activity  . Alcohol use: Yes    Comment: rare  . Drug use: No  . Sexual activity: Not Currently  Other Topics Concern  . Not on file  Social History Narrative   Lives with husband.  Has 2 biological children & 1 adopted child.  Takes care of disables son.  Still works and does all the housework   Social Determinants of Corporate investment banker Strain:   . Difficulty of Paying Living Expenses:   Food Insecurity:   . Worried About Programme researcher, broadcasting/film/video in the Last Year:   . Barista in the Last Year:   Transportation Needs:   . Freight forwarder (Medical):   Marland Kitchen Lack of Transportation (Non-Medical):   Physical Activity:   . Days of Exercise per Week:   . Minutes of Exercise per Session:   Stress:   . Feeling of Stress :   Social Connections:   . Frequency of Communication with Friends and Family:   . Frequency of Social Gatherings with Friends and Family:   . Attends Religious Services:   . Active Member of Clubs or Organizations:   . Attends Banker Meetings:   Marland Kitchen Marital Status:   Intimate Partner Violence:   . Fear of Current or Ex-Partner:   . Emotionally Abused:   Marland Kitchen  Physically Abused:   . Sexually Abused:      ROS- All systems are reviewed and negative except as per the HPI above.  Physical Exam: Vitals:   10/14/19 0945  BP: 130/70  Pulse: (!) 52  Weight: 49.7 kg  Height: 5\' 4"  (1.626 m)    GEN- The patient is well appearing elderly female, alert and oriented x 3 today.   Head- normocephalic, atraumatic Eyes-  Sclera clear, conjunctiva pink Ears- hearing intact Oropharynx- clear Neck- supple  Lungs- Clear to ausculation bilaterally, normal work of breathing Heart- Regular rate and rhythm, bradycardia, no murmurs, rubs or gallops  GI- soft, NT, ND, + BS Extremities- no clubbing, cyanosis, or edema MS- no  significant deformity or atrophy Skin- no rash or lesion Psych- euthymic mood, full affect Neuro- strength and sensation are intact  Wt Readings from Last 3 Encounters:  10/14/19 49.7 kg  10/07/19 48.2 kg  09/30/19 52.7 kg    EKG today demonstrates SB HR 52, LAD, PR 142, QRS 72, QTc 379  Echo 10/01/19 demonstrated  1. Left ventricular ejection fraction, by estimation, is 60 to 65%. The  left ventricle has normal function. The left ventricle has no regional  wall motion abnormalities. Unable to assess LV diastolic filling due to  underlying atrial flutter.  2. Right ventricular systolic function is mildly reduced. The right  ventricular size is mildly enlarged. There is moderately elevated  pulmonary artery systolic pressure. The estimated right ventricular  systolic pressure is 61.3 mmHg.  3. Left atrial size was severely dilated.  4. Right atrial size was severely dilated.  5. The mitral valve is normal in structure. Mild to moderate mitral valve  regurgitation. No evidence of mitral stenosis.  6. Tricuspid valve regurgitation is moderate to severe.  7. The aortic valve is tricuspid. Aortic valve regurgitation is mild.  Mild to moderate aortic valve sclerosis/calcification is present, without  any evidence of aortic  stenosis.  8. The inferior vena cava is dilated in size with >50% respiratory  variability, suggesting right atrial pressure of 8 mmHg.   Epic records are reviewed at length today  CHA2DS2-VASc Score = 3  The patient's score is based upon: CHF History: 0 HTN History: 0 Age : 2 Diabetes History: 0 Stroke History: 0 Vascular Disease History: 0 Gender: 1      ASSESSMENT AND PLAN: 1. Paroxysmal Atrial Fibrillation (ICD10:  I48.0) The patient's CHA2DS2-VASc score is 3, indicating a 3.2% annual risk of stroke.   General education about afib provided and questions answered. We also discussed her stroke risk and the risks and benefits of anticoagulation.  Suspect 2/2 hyperthyroidism.  Continue atenolol 50 mg BID Continue Eliquis 5 mg BID  2. Secondary Hypercoagulable State (ICD10:  D68.69) The patient is at significant risk for stroke/thromboembolism based upon her CHA2DS2-VASc Score of 3.  Continue Apixaban (Eliquis).   3. Bradycardia Patient is asymptomatic. Will continue present dose of BB for now. Could consider decreasing atenolol if needed.    Follow up with Dr Antoine Poche as scheduled. AF clinic as needed.    Jorja Loa PA-C [facility] [address] [place], [place] [address] [phone] 10/14/2019 10:07 AM

## 2019-10-22 ENCOUNTER — Other Ambulatory Visit: Payer: Self-pay

## 2019-10-22 ENCOUNTER — Encounter: Payer: Self-pay | Admitting: Internal Medicine

## 2019-10-22 ENCOUNTER — Ambulatory Visit (INDEPENDENT_AMBULATORY_CARE_PROVIDER_SITE_OTHER): Payer: Medicare HMO | Admitting: Internal Medicine

## 2019-10-22 VITALS — BP 130/70 | HR 54 | Ht 64.0 in | Wt 109.0 lb

## 2019-10-22 DIAGNOSIS — E0500 Thyrotoxicosis with diffuse goiter without thyrotoxic crisis or storm: Secondary | ICD-10-CM | POA: Diagnosis not present

## 2019-10-22 DIAGNOSIS — Z7189 Other specified counseling: Secondary | ICD-10-CM | POA: Insufficient documentation

## 2019-10-22 LAB — T4, FREE: Free T4: 1.29 ng/dL (ref 0.60–1.60)

## 2019-10-22 LAB — T3, FREE: T3, Free: 4.1 pg/mL (ref 2.3–4.2)

## 2019-10-22 LAB — TSH: TSH: <0.01 u[IU]/mL — ABNORMAL LOW (ref 0.35–4.50)

## 2019-10-22 NOTE — Patient Instructions (Addendum)
Please stop at the lab.  Please continue: - Methimazole 10 mg 2x a day - Atenolol 50 mg 2x a day  Please come back for a follow-up appointment in 3 months.   Hyperthyroidism  Hyperthyroidism is when the thyroid gland is too active (overactive). The thyroid gland is a small gland located in the lower front part of the neck, just in front of the windpipe (trachea). This gland makes hormones that help control how the body uses food for energy (metabolism) as well as how the heart and brain function. These hormones also play a role in keeping your bones strong. When the thyroid is overactive, it produces too much of a hormone called thyroxine. What are the causes? This condition may be caused by:  Graves' disease. This is a disorder in which the body's disease-fighting system (immune system) attacks the thyroid gland. This is the most common cause.  Inflammation of the thyroid gland.  A tumor in the thyroid gland.  Use of certain medicines, including: ? Prescription thyroid hormone replacement. ? Herbal supplements that mimic thyroid hormones. ? Amiodarone therapy.  Solid or fluid-filled lumps within your thyroid gland (thyroid nodules).  Taking in a large amount of iodine from foods or medicines. What increases the risk? You are more likely to develop this condition if:  You are female.  You have a family history of thyroid conditions.  You smoke tobacco.  You use a medicine called lithium.  You take medicines that affect the immune system (immunosuppressants). What are the signs or symptoms? Symptoms of this condition include:  Nervousness.  Inability to tolerate heat.  Unexplained weight loss.  Diarrhea.  Change in the texture of hair or skin.  Heart skipping beats or making extra beats.  Rapid heart rate.  Loss of menstruation.  Shaky hands.  Fatigue.  Restlessness.  Sleep problems.  Enlarged thyroid gland or a lump in the thyroid (nodule). You may  also have symptoms of Graves' disease, which may include:  Protruding eyes.  Dry eyes.  Red or swollen eyes.  Problems with vision. How is this diagnosed? This condition may be diagnosed based on:  Your symptoms and medical history.  A physical exam.  Blood tests.  Thyroid ultrasound. This test involves using sound waves to produce images of the thyroid gland.  A thyroid scan. A radioactive substance is injected into a vein, and images show how much iodine is present in the thyroid.  Radioactive iodine uptake test (RAIU). A small amount of radioactive iodine is given by mouth to see how much iodine the thyroid absorbs after a certain amount of time. How is this treated? Treatment depends on the cause and severity of the condition. Treatment may include:  Medicines to reduce the amount of thyroid hormone your body makes.  Radioactive iodine treatment (radioiodine therapy). This involves swallowing a small dose of radioactive iodine, in capsule or liquid form, to kill thyroid cells.  Surgery to remove part or all of your thyroid gland. You may need to take thyroid hormone replacement medicine for the rest of your life after thyroid surgery.  Medicines to help manage your symptoms. Follow these instructions at home:   Take over-the-counter and prescription medicines only as told by your health care provider.  Do not use any products that contain nicotine or tobacco, such as cigarettes and e-cigarettes. If you need help quitting, ask your health care provider.  Follow any instructions from your health care provider about diet. You may be instructed to limit  foods that contain iodine.  Keep all follow-up visits as told by your health care provider. This is important. ? You will need to have blood tests regularly so that your health care provider can monitor your condition. Contact a health care provider if:  Your symptoms do not get better with treatment.  You have a  fever.  You are taking thyroid hormone replacement medicine and you: ? Have symptoms of depression. ? Feel like you are tired all the time. ? Gain weight. Get help right away if:  You have chest pain.  You have decreased alertness or a change in your awareness.  You have abdominal pain.  You feel dizzy.  You have a rapid heartbeat.  You have an irregular heartbeat.  You have difficulty breathing. Summary  The thyroid gland is a small gland located in the lower front part of the neck, just in front of the windpipe (trachea).  Hyperthyroidism is when the thyroid gland is too active (overactive) and produces too much of a hormone called thyroxine.  The most common cause is Graves' disease, a disorder in which your immune system attacks the thyroid gland.  Hyperthyroidism can cause various symptoms, such as unexplained weight loss, nervousness, inability to tolerate heat, or changes in your heartbeat.  Treatment may include medicine to reduce the amount of thyroid hormone your body makes, radioiodine therapy, surgery, or medicines to manage symptoms. This information is not intended to replace advice given to you by your health care provider. Make sure you discuss any questions you have with your health care provider. Document Revised: 05/04/2017 Document Reviewed: 05/02/2017 Elsevier Patient Education  2020 [employer].

## 2019-10-22 NOTE — Progress Notes (Addendum)
Patient ID: Lynn Hayden, female   DOB: 1940-08-21, 79 y.o.   MRN: [id number]   This visit occurred during the SARS-CoV-2 public health emergency.  Safety protocols were in place, including screening questions prior to the visit, additional usage of staff PPE, and extensive cleaning of exam room while observing appropriate contact time as indicated for disinfecting solutions.   HPI  Lynn Hayden is a 79 y.o.-year-old female, referred by her cardiologist, Dr. Antoine Poche, for evaluation and management of thyrotoxicosis.  Patient has a history of A. fib and was admitted with RVR twice in the last 2 months: 08/25/2018 and 09/30/2019.  During her last admission, she was found to be thyrotoxic.   On 10/07/2019, She was started on: - Methimazole 10 mg 2x a day - Atenolol 50 mg 2x a day  Retrospectively, she had SOB, palpitations for at least 6 months. She feels MUCH better now, w/o SOB, can finally walk and perform other daily activities well.  I reviewed pt's thyroid tests: Lab Results  Component Value Date   TSH <0.010 (L) 09/30/2019   TSH 1.84 12/21/2014   TSH 1.92 07/01/2014   FREET4 2.09 (H) 10/06/2019   FREET4 3.62 (H) 10/01/2019   FREET4 0.89 07/01/2014   T3FREE 3.6 10/06/2019   Graves' antibodies were elevated: Lab Results  Component Value Date   TSI 7.24 (H) 10/01/2019  07/01/2014: TPO Abs 1 (<9)  Thyroid ultrasound (05/26/2014) checked due to history of enlarged thyroid was normal: -Normal gland size -No thyroid nodules -No lymphadenopathy  Pt denies: - feeling nodules in neck - hoarseness - dysphagia - choking - SOB with lying down  She mentions: - + some fatigue, + insomnia - no excessive sweating/heat intolerance - no tremors - no anxiety - + palpitations >> resolved - no increased hyperdefecation, but had D-IBS - on Lomotil - +  weight loss - 5 lbs in last year (not per review of chart) - + hair loss  Pt does not have a FH of thyroid ds. No FH of thyroid  cancer. Sister with lymphoma. No h/o radiation tx to head or neck.  No seaweed or kelp, no recent contrast studies. No steroid use. No herbal supplements. No Biotin use.  Pt. also has a history of osteoporosis. On calcium and vitamin D. Not on antiresorptive medicines. Due for a DXA next year.  This is managed by PCP.  ROS: Constitutional: + see HPI Eyes: no blurry vision, no xerophthalmia ENT: no sore throat, + see HPI, + decreased hearing, + tinnitus Cardiovascular: no CP/+ resolved SOB/+ resolved palpitations/leg swelling Respiratory: no cough/+ resolved SOB Gastrointestinal: no N/V/+ D/no C Musculoskeletal: no muscle/joint aches Skin: no rashes Neurological: no tremors/numbness/tingling/dizziness Psychiatric: no depression/anxiety  Past Medical History:  Diagnosis Date  . Arthritis   . Chronic diarrhea   . Chronic diastolic CHF (congestive heart failure) (HCC)   . Hemorrhoid   . Hyperthyroidism   . Hypertrophic cardiomegaly    a. history of hypertrophic cardiomyopathy (mild septal focal hypertrophy) by echo in 2015 but this has not been seen on more recent echoes.  . IBS (irritable bowel syndrome)   . Insomnia   . Miscarriage   . Osteoporosis   . Persistent atrial fibrillation (HCC)   . Pulmonary hypertension (HCC)   . Thyromegaly   . Vitamin D deficiency    Past Surgical History:  Procedure Laterality Date  . CATARACT EXTRACTION    . DILATION AND CURETTAGE OF UTERUS    . EAR CYST EXCISION  11/09/2011   Procedure: CYST REMOVAL;  Surgeon: Tami Ribas, MD;  Location: [facility];  Service: Orthopedics;  Laterality: Right;  right index excision cyst/foreign body and debridement DIP joint  . GANGLION CYST EXCISION     rt wrist  . GANGLION CYST EXCISION    . TONSILLECTOMY    . WISDOM TOOTH EXTRACTION     Social History   Socioeconomic History  . Marital status: Married    Spouse name: Not on file  . Number of children: 3  . Years of education: Not on  file  . Highest education level: Not on file  Occupational History    Comment: retired  Tobacco Use  . Smoking status: Former Smoker    Quit date: 11/06/1988    Years since quitting: 30.9  . Smokeless tobacco: Never Used  Substance and Sexual Activity  . Alcohol use: Yes    Comment: rare  . Drug use: No  . Sexual activity: Not Currently  Other Topics Concern  . Not on file  Social History Narrative   Lives with husband.  Has 2 biological children & 1 adopted child.  Takes care of disables son.  Still works and does all the housework   Social Determinants of Corporate investment banker Strain:   . Difficulty of Paying Living Expenses:   Food Insecurity:   . Worried About Programme researcher, broadcasting/film/video in the Last Year:   . Barista in the Last Year:   Transportation Needs:   . Freight forwarder (Medical):   Marland Kitchen Lack of Transportation (Non-Medical):   Physical Activity:   . Days of Exercise per Week:   . Minutes of Exercise per Session:   Stress:   . Feeling of Stress :   Social Connections:   . Frequency of Communication with Friends and Family:   . Frequency of Social Gatherings with Friends and Family:   . Attends Religious Services:   . Active Member of Clubs or Organizations:   . Attends Banker Meetings:   Marland Kitchen Marital Status:   Intimate Partner Violence:   . Fear of Current or Ex-Partner:   . Emotionally Abused:   Marland Kitchen Physically Abused:   . Sexually Abused:    Current Outpatient Medications on File Prior to Visit  Medication Sig Dispense Refill  . apixaban (ELIQUIS) 5 MG TABS tablet Take 1 tablet (5 mg total) by mouth 2 (two) times daily. 180 tablet 3  . atenolol (TENORMIN) 50 MG tablet Take 1 tablet (50 mg total) by mouth 2 (two) times daily. 60 tablet 5  . Calcium Carb-Cholecalciferol [phone] MG-UNIT TABS Take 2 tablets by mouth daily.     . diphenoxylate-atropine (LOMOTIL) 2.5-0.025 MG per tablet Take 1 tablet by mouth as needed for diarrhea or loose  stools. 30 tablet 4  . methimazole (TAPAZOLE) 10 MG tablet Take 1 tablet (10 mg total) by mouth 2 (two) times daily. 60 tablet 1  . temazepam (RESTORIL) 15 MG capsule Take 15 mg by mouth at bedtime as needed for sleep.    . Vitamin D, Cholecalciferol, 25 MCG (1000 UT) CAPS Take 1,000 Units by mouth daily.    . furosemide (LASIX) 20 MG tablet Take 1 tablet (20 mg total) by mouth daily as needed (for fluid retention, or weight gain of 3-5lb). (Patient not taking: Reported on 10/22/2019) 30 tablet 3  . potassium chloride (KLOR-CON) 10 MEQ tablet Take 1 tablet (10 mEq total) by mouth daily  as needed (Take 1 tablet when taking a dose of Lasix/furosemide.). (Patient not taking: Reported on 10/22/2019) 30 tablet 3   No current facility-administered medications on file prior to visit.   Allergies  Allergen Reactions  . Amoxicillin Rash   Family History  Problem Relation Age of Onset  . Vascular Disease Mother   . Diabetes Father   . Dementia Father   . Lymphoma Sister     PE: BP 130/70   Pulse (!) 54   Ht 5\' 4"  (1.626 m)   Wt 109 lb (49.4 kg)   SpO2 98%   BMI 18.71 kg/m  Wt Readings from Last 3 Encounters:  10/22/19 109 lb (49.4 kg)  10/14/19 109 lb 9.6 oz (49.7 kg)  10/07/19 106 lb 4.2 oz (48.2 kg)   Constitutional: Thin, in NAD Eyes: PERRLA, EOMI, no exophthalmos, no lid lag, no stare ENT: moist mucous membranes, no thyromegaly, no thyroid bruits, no cervical lymphadenopathy Cardiovascular: RRR, No MRG Respiratory: CTA B Gastrointestinal: abdomen soft, NT, ND, BS+ Musculoskeletal: no deformities, strength intact in all 4 Skin: moist, warm, no rashes Neurological: no tremor with outstretched hands, DTR normal in all 4  ASSESSMENT: 1.  Graves' disease  PLAN:  1. Patient with a recently found low TSH and elevated free T4 and free T3 during admission for A. fib with RVR, with thyrotoxic sxs: Palpitations, shortness of breath, mild weight loss, fatigue, insomnia.  She also has  diarrhea but this is longstanding, in the setting of IBS.   - She was started on methimazole and atenolol after her last admission, approximately 2 weeks ago.  She started to feel better right away, with resolved palpitations and shortness of breath.  She also can perform her daily activities for now, previously, it was very difficult. - she does not appear to have exogenous causes for the low TSH.  - We discussed that possible causes of thyrotoxicosis are:  12/07/19 Graves ds  (most likely etiology in the setting of elevated TSI antibodies) . Thyroiditis . toxic multinodular goiter/ toxic adenoma (I cannot feel nodules at palpation of her thyroid). - will check the TSH, fT3 and fT4 and repeat thyroid stimulating antibodies today - I do not see the need for a thyroid uptake and scan for now since the etiology of her thyrotoxicosis is quite obvious - we discussed about possible modalities of treatment for the above conditions, to include methimazole use, radioactive iodine ablation or (last resort) surgery.  For now, we will continue methimazole, but I believe we may need to decrease the dose of methimazole after the new results are back. - No need to repeat a thyroid ultrasound.  She had no thyroid nodules on the ultrasound from 2016-we reviewed the results together. - continue beta blockers at this time, per cardiology - no signs of Graves' ophthalmopathy: she does not have any double vision, blurry vision, eye pain, chemosis. - RTC in 3 months, but likely sooner for repeat labs  Needs Rx for MMI 5, in case we decrease the dose.  She currently has 10 mg tablets at home.  Component     Latest Ref Rng & Units 10/22/2019  TSH     0.35 - 4.50 uIU/mL <0.01 (L)  T4,Free(Direct)     0.60 - 1.60 ng/dL 10/24/2019  Triiodothyronine,Free,Serum     2.3 - 4.2 pg/mL 4.1   Thyroid tests are better. TSH is still low, but this would be the last time to improve. We will decrease the dose of  methimazole to 5 mg twice a  day and we will repeat the labs in 1 month.  Component     Latest Ref Rng & Units 10/22/2019  TSI     <140 % baseline 516 (H)   Her TSI is elevated, confirming Graves' disease.  Philemon Kingdom, MD PhD [facility] Endocrinology

## 2019-10-22 NOTE — Progress Notes (Signed)
Cardiology Office Note   Date:  10/22/2019   ID:  Lynn Hayden, Lynn Hayden September 03, 1940, MRN [id number]  PCP:  [name], MD  Cardiologist:   Rollene Rotunda, MD   No chief complaint on file.     History of Present Illness: Lynn Hayden is a 79 y.o. female who presents for follow up of persistent atrial fib. She was hospitalized for this and was found to have hyperthyroidism.  She was treated for this and was going to have cardioversion but converted eventually no her own.    Since I last saw her she has done much better.  She feels better than she has in a year.  She has not had any palpitations. The patient denies any new symptoms such as chest discomfort, neck or arm discomfort. There has been no new shortness of breath, PND or orthopnea. There have been no reported presyncope or syncope.    Past Medical History:  Diagnosis Date  . Arthritis   . Chronic diarrhea   . Chronic diastolic CHF (congestive heart failure) (HCC)   . Hemorrhoid   . Hyperthyroidism   . Hypertrophic cardiomegaly    a. history of hypertrophic cardiomyopathy (mild septal focal hypertrophy) by echo in 2015 but this has not been seen on more recent echoes.  . IBS (irritable bowel syndrome)   . Insomnia   . Miscarriage   . Osteoporosis   . Persistent atrial fibrillation (HCC)   . Pulmonary hypertension (HCC)   . Thyromegaly   . Vitamin D deficiency     Past Surgical History:  Procedure Laterality Date  . CATARACT EXTRACTION    . DILATION AND CURETTAGE OF UTERUS    . EAR CYST EXCISION  11/09/2011   Procedure: CYST REMOVAL;  Surgeon: Tami Ribas, MD;  Location: [facility];  Service: Orthopedics;  Laterality: Right;  right index excision cyst/foreign body and debridement DIP joint  . GANGLION CYST EXCISION     rt wrist  . GANGLION CYST EXCISION    . TONSILLECTOMY    . WISDOM TOOTH EXTRACTION       Current Outpatient Medications  Medication Sig Dispense Refill  . apixaban  (ELIQUIS) 5 MG TABS tablet Take 1 tablet (5 mg total) by mouth 2 (two) times daily. 180 tablet 3  . atenolol (TENORMIN) 50 MG tablet Take 1 tablet (50 mg total) by mouth 2 (two) times daily. 60 tablet 5  . Calcium Carb-Cholecalciferol [phone] MG-UNIT TABS Take 2 tablets by mouth daily.     . diphenoxylate-atropine (LOMOTIL) 2.5-0.025 MG per tablet Take 1 tablet by mouth as needed for diarrhea or loose stools. 30 tablet 4  . furosemide (LASIX) 20 MG tablet Take 1 tablet (20 mg total) by mouth daily as needed (for fluid retention, or weight gain of 3-5lb). (Patient not taking: Reported on 10/22/2019) 30 tablet 3  . methimazole (TAPAZOLE) 10 MG tablet Take 1 tablet (10 mg total) by mouth 2 (two) times daily. 60 tablet 1  . potassium chloride (KLOR-CON) 10 MEQ tablet Take 1 tablet (10 mEq total) by mouth daily as needed (Take 1 tablet when taking a dose of Lasix/furosemide.). (Patient not taking: Reported on 10/22/2019) 30 tablet 3  . temazepam (RESTORIL) 15 MG capsule Take 15 mg by mouth at bedtime as needed for sleep.    . Vitamin D, Cholecalciferol, 25 MCG (1000 UT) CAPS Take 1,000 Units by mouth daily.     No current facility-administered medications for this visit.  Allergies:   Amoxicillin   ROS:  Please see the history of present illness.   Otherwise, review of systems are positive for none.   All other systems are reviewed and negative.    PHYSICAL EXAM: VS:  There were no vitals taken for this visit. , BMI There is no height or weight on file to calculate BMI. GENERAL:  Well appearing NECK:  No jugular venous distention, waveform within normal limits, carotid upstroke brisk and symmetric, no bruits, no thyromegaly LUNGS:  Clear to auscultation bilaterally CHEST:  Unremarkable HEART:  PMI not displaced or sustained,S1 and S2 within normal limits, no S3, no S4, no clicks, no rubs, no murmurs ABD:  Flat, positive bowel sounds normal in frequency in pitch, no bruits, no rebound, no  guarding, no midline pulsatile mass, no hepatomegaly, no splenomegaly EXT:  2 plus pulses throughout, no edema, no cyanosis no clubbing   EKG:  EKG is ordered today. The ekg ordered today demonstrates sinus bradycardia, rate 51, axis within normal limits, intervals within normal limits, no acute ST-T wave changes.   Recent Labs: 09/30/2019: B Natriuretic Peptide 433.8 10/05/2019: ALT 18; Hemoglobin 12.2; Platelets 209 10/07/2019: BUN 17; Creatinine, Ser 0.73; Magnesium 2.0; Potassium 4.6; Sodium 136 10/22/2019: TSH <0.01    Lipid Panel No results found for: CHOL, TRIG, HDL, CHOLHDL, VLDL, LDLCALC, LDLDIRECT    Wt Readings from Last 3 Encounters:  10/22/19 109 lb (49.4 kg)  10/14/19 109 lb 9.6 oz (49.7 kg)  10/07/19 106 lb 4.2 oz (48.2 kg)      Other studies Reviewed: Additional studies/ records that were reviewed today include: None. Review of the above records demonstrates:     ASSESSMENT AND PLAN:  ATRIAL FIB:   Patient is now in sinus rhythm.  She is having her thyroid managed.  She is going to continue with anticoagulation.  For now I am going to continue her beta-blocker at the current dose although if she tells me she has any problems with low heart rates I might reduce it before I see her next.  HYPERTHYROIDISM:   She is seeing Dr.Gherghe.  Her TSH was still low yesterday but T3-T4 were normal.  I will defer to her management.  COVID EDUCATION: She has had her vaccine.   Current medicines are reviewed at length with the patient today.  The patient does not have concerns regarding medicines.  The following changes have been made:  no change  Labs/ tests ordered today include: None No orders of the defined types were placed in this encounter.    Disposition:   FU with me in six months.     Signed, Minus Breeding, MD  10/22/2019 5:22 PM    [facility]

## 2019-10-23 ENCOUNTER — Ambulatory Visit: Payer: Medicare HMO | Admitting: Cardiology

## 2019-10-23 ENCOUNTER — Encounter: Payer: Self-pay | Admitting: Cardiology

## 2019-10-23 VITALS — BP 121/68 | HR 53 | Temp 97.3°F | Resp 18 | Ht 64.0 in | Wt 110.6 lb

## 2019-10-23 DIAGNOSIS — I4819 Other persistent atrial fibrillation: Secondary | ICD-10-CM | POA: Diagnosis not present

## 2019-10-23 DIAGNOSIS — E0590 Thyrotoxicosis, unspecified without thyrotoxic crisis or storm: Secondary | ICD-10-CM

## 2019-10-23 DIAGNOSIS — Z7189 Other specified counseling: Secondary | ICD-10-CM

## 2019-10-23 NOTE — Patient Instructions (Signed)
Medication Instructions:  NO CHANGES *If you need a refill on your cardiac medications before your next appointment, please call your pharmacy*  Lab Work: NONE ORDERED THIS VISIT  Testing/Procedures: NONE ORDERED THIS VISIT  Follow-Up: At [facility], you and your health needs are our priority.  As part of our continuing mission to provide you with exceptional heart care, we have created designated Provider Care Teams.  These Care Teams include your primary Cardiologist (physician) and Advanced Practice Providers (APPs -  Physician Assistants and Nurse Practitioners) who all work together to provide you with the care you need, when you need it.  Your next appointment:   6 month(s)  The format for your next appointment:   In Person  Provider:   Rollene Rotunda, MD

## 2019-10-24 MED ORDER — METHIMAZOLE 5 MG PO TABS: 5.0000 mg | ORAL_TABLET | Freq: Two times a day (BID) | ORAL | 5 refills | Status: DC

## 2019-10-26 LAB — THYROID STIMULATING IMMUNOGLOBULIN: TSI: 516 % baseline — ABNORMAL HIGH (ref <140)

## 2019-10-29 ENCOUNTER — Other Ambulatory Visit: Payer: Self-pay | Admitting: Physician Assistant

## 2019-11-26 ENCOUNTER — Other Ambulatory Visit: Payer: Self-pay | Admitting: Internal Medicine

## 2019-11-26 ENCOUNTER — Other Ambulatory Visit: Payer: Self-pay

## 2019-11-26 ENCOUNTER — Encounter: Payer: Self-pay | Admitting: Internal Medicine

## 2019-11-26 ENCOUNTER — Other Ambulatory Visit (INDEPENDENT_AMBULATORY_CARE_PROVIDER_SITE_OTHER): Payer: Medicare HMO

## 2019-11-26 DIAGNOSIS — E0500 Thyrotoxicosis with diffuse goiter without thyrotoxic crisis or storm: Secondary | ICD-10-CM | POA: Diagnosis not present

## 2019-11-26 LAB — TSH: TSH: <0.01 u[IU]/mL — ABNORMAL LOW (ref 0.35–4.50)

## 2019-11-26 LAB — T3, FREE: T3, Free: 2 pg/mL — ABNORMAL LOW (ref 2.3–4.2)

## 2019-11-26 LAB — T4, FREE: Free T4: 0.55 ng/dL — ABNORMAL LOW (ref 0.60–1.60)

## 2019-11-26 MED ORDER — METHIMAZOLE 5 MG PO TABS: 5.0000 mg | ORAL_TABLET | Freq: Every day | ORAL | 5 refills | Status: DC

## 2019-11-27 DIAGNOSIS — H6980 Other specified disorders of Eustachian tube, unspecified ear: Secondary | ICD-10-CM | POA: Diagnosis not present

## 2019-11-27 DIAGNOSIS — H6123 Impacted cerumen, bilateral: Secondary | ICD-10-CM | POA: Diagnosis not present

## 2019-11-28 ENCOUNTER — Ambulatory Visit: Payer: Medicare HMO | Admitting: Cardiology

## 2019-12-30 ENCOUNTER — Other Ambulatory Visit: Payer: Self-pay | Admitting: Physician Assistant

## 2020-01-22 ENCOUNTER — Other Ambulatory Visit: Payer: Self-pay | Admitting: Internal Medicine

## 2020-01-22 ENCOUNTER — Other Ambulatory Visit (INDEPENDENT_AMBULATORY_CARE_PROVIDER_SITE_OTHER): Payer: Medicare HMO

## 2020-01-22 ENCOUNTER — Other Ambulatory Visit: Payer: Self-pay

## 2020-01-22 DIAGNOSIS — E0500 Thyrotoxicosis with diffuse goiter without thyrotoxic crisis or storm: Secondary | ICD-10-CM | POA: Diagnosis not present

## 2020-01-22 LAB — T3, FREE: T3, Free: 2.4 pg/mL (ref 2.3–4.2)

## 2020-01-22 LAB — T4, FREE: Free T4: 0.49 ng/dL — ABNORMAL LOW (ref 0.60–1.60)

## 2020-01-22 LAB — TSH: TSH: 7.93 u[IU]/mL — ABNORMAL HIGH (ref 0.35–4.50)

## 2020-01-22 MED ORDER — METHIMAZOLE 5 MG PO TABS: 2.5000 mg | ORAL_TABLET | Freq: Every day | ORAL | 5 refills | Status: DC

## 2020-02-12 ENCOUNTER — Ambulatory Visit: Payer: Medicare HMO | Admitting: Internal Medicine

## 2020-02-12 ENCOUNTER — Encounter: Payer: Self-pay | Admitting: Internal Medicine

## 2020-02-12 ENCOUNTER — Other Ambulatory Visit: Payer: Self-pay

## 2020-02-12 VITALS — BP 140/70 | HR 60 | Ht 64.0 in | Wt 120.0 lb

## 2020-02-12 DIAGNOSIS — E0500 Thyrotoxicosis with diffuse goiter without thyrotoxic crisis or storm: Secondary | ICD-10-CM

## 2020-02-12 LAB — T4, FREE: Free T4: 0.91 ng/dL (ref 0.60–1.60)

## 2020-02-12 LAB — TSH: TSH: 0.35 u[IU]/mL (ref 0.35–4.50)

## 2020-02-12 LAB — T3, FREE: T3, Free: 2.8 pg/mL (ref 2.3–4.2)

## 2020-02-12 NOTE — Patient Instructions (Addendum)
Please continue the current dose of methimazole for now.  Please stop at the lab.  Try to contact cardiology to see if you can decrease the Atenolol dose.  Please come back for a follow-up appointment in 3 months.

## 2020-02-12 NOTE — Progress Notes (Addendum)
Patient ID: Lynn Hayden, female   DOB: 10-14-1940, 79 y.o.   MRN: [id number]   This visit occurred during the SARS-CoV-2 public health emergency.  Safety protocols were in place, including screening questions prior to the visit, additional usage of staff PPE, and extensive cleaning of exam room while observing appropriate contact time as indicated for disinfecting solutions.   HPI  Lynn Hayden is a 79 y.o.-year-old female, referred by her cardiologist, Dr. Antoine Poche, for evaluation and management of thyrotoxicosis.  Reviewed and addended history: Patient has a history of A. fib and was admitted with RVR twice in the last 2 months: 08/25/2018 and 09/30/2019.  During her admission from 09/2019, she was found to be thyrotoxic.   On 10/07/2019, She was started on: - Methimazole 10 mg 2x a day - Atenolol 50 mg 2x a day  10/22/2019: Decreased methimazole to 5 mg twice daily  11/26/2019: Decreased methimazole to 5 mg daily  01/12/2020: Decreased methimazole to 2.5 mg daily  Retrospectively, she had SOB, palpitations for at least 6 months.   She felt much better at last visit w/o SOB, can finally walk and perform other daily activities well.  At this visit, she feels more fatigued.  Reviewed her TFTs: Lab Results  Component Value Date   TSH 7.93 (H) 01/22/2020   TSH <0.01 (L) 11/26/2019   TSH <0.01 (L) 10/22/2019   TSH <0.010 (L) 09/30/2019   TSH 1.84 12/21/2014   TSH 1.92 07/01/2014   FREET4 0.49 (L) 01/22/2020   FREET4 0.55 (L) 11/26/2019   FREET4 1.29 10/22/2019   FREET4 2.09 (H) 10/06/2019   FREET4 3.62 (H) 10/01/2019   FREET4 0.89 07/01/2014   T3FREE 2.4 01/22/2020   T3FREE 2.0 (L) 11/26/2019   T3FREE 4.1 10/22/2019   T3FREE 3.6 10/06/2019   Her Graves' antibodies were elevated, but improving: Lab Results  Component Value Date   TSI 516 (H) 10/22/2019   TSI 7.24 (H) 10/01/2019  07/01/2014: TPO Abs 1 (<9)  Thyroid ultrasound (05/26/2014) checked due to history of enlarged  thyroid was normal: -Normal gland size -No thyroid nodules -No lymphadenopathy  Pt denies: - feeling nodules in neck - hoarseness - dysphagia - choking - SOB with lying down She has to clear her throat more frequently. . Pt does not have a FH of thyroid ds. No FH of thyroid cancer. No h/o radiation tx to head or neck.  No seaweed or kelp. No recent contrast studies. No herbal supplements. No Biotin use. No recent steroids use.  Daily  She has a history of IBS-diarrhea.  Pt. also has a history of osteoporosis. On calcium and vitamin D. Not on antiresorptive medicines. Due for a DXA next year.  This is managed by PCP.  ROS: Constitutional: + weight gain (previously lost 5 pounds in the previous year)/no weight loss, + fatigue, no subjective hyperthermia, no subjective hypothermia Eyes: no blurry vision, no xerophthalmia ENT: no sore throat, + see HPI Cardiovascular: no CP/no SOB/no palpitations/no leg swelling Respiratory: no cough/no SOB/no wheezing Gastrointestinal: no N/no V/no D/no C/no acid reflux Musculoskeletal: no muscle aches/no joint aches Skin: no rashes, + hair loss Neurological: no tremors/no numbness/no tingling/no dizziness  I reviewed pt's medications, allergies, PMH, social hx, family hx, and changes were documented in the history of present illness. Otherwise, unchanged from my initial visit note.  Past Medical History:  Diagnosis Date  . Arthritis   . Chronic diarrhea   . Chronic diastolic CHF (congestive heart failure) (HCC)   .  Hemorrhoid   . Hyperthyroidism   . Hypertrophic cardiomegaly    a. history of hypertrophic cardiomyopathy (mild septal focal hypertrophy) by echo in 2015 but this has not been seen on more recent echoes.  . IBS (irritable bowel syndrome)   . Insomnia   . Miscarriage   . Osteoporosis   . Persistent atrial fibrillation (HCC)   . Pulmonary hypertension (HCC)   . Thyromegaly   . Vitamin D deficiency    Past Surgical History:   Procedure Laterality Date  . CATARACT EXTRACTION    . DILATION AND CURETTAGE OF UTERUS    . EAR CYST EXCISION  11/09/2011   Procedure: CYST REMOVAL;  Surgeon: Tami Ribas, MD;  Location: [facility];  Service: Orthopedics;  Laterality: Right;  right index excision cyst/foreign body and debridement DIP joint  . GANGLION CYST EXCISION     rt wrist  . GANGLION CYST EXCISION    . TONSILLECTOMY    . WISDOM TOOTH EXTRACTION     Social History   Socioeconomic History  . Marital status: Married    Spouse name: Not on file  . Number of children: 3  . Years of education: Not on file  . Highest education level: Not on file  Occupational History    Comment: retired  Tobacco Use  . Smoking status: Former Smoker    Quit date: 11/06/1988    Years since quitting: 31.2  . Smokeless tobacco: Never Used  Substance and Sexual Activity  . Alcohol use: Yes    Comment: rare  . Drug use: No  . Sexual activity: Not Currently  Other Topics Concern  . Not on file  Social History Narrative   Lives with husband.  Has 2 biological children & 1 adopted child.  Takes care of disables son.  Still works and does all the housework   Social Determinants of Corporate investment banker Strain:   . Difficulty of Paying Living Expenses: Not on file  Food Insecurity:   . Worried About Programme researcher, broadcasting/film/video in the Last Year: Not on file  . Ran Out of Food in the Last Year: Not on file  Transportation Needs:   . Lack of Transportation (Medical): Not on file  . Lack of Transportation (Non-Medical): Not on file  Physical Activity:   . Days of Exercise per Week: Not on file  . Minutes of Exercise per Session: Not on file  Stress:   . Feeling of Stress : Not on file  Social Connections:   . Frequency of Communication with Friends and Family: Not on file  . Frequency of Social Gatherings with Friends and Family: Not on file  . Attends Religious Services: Not on file  . Active Member of Clubs or  Organizations: Not on file  . Attends Banker Meetings: Not on file  . Marital Status: Not on file  Intimate Partner Violence:   . Fear of Current or Ex-Partner: Not on file  . Emotionally Abused: Not on file  . Physically Abused: Not on file  . Sexually Abused: Not on file   Current Outpatient Medications on File Prior to Visit  Medication Sig Dispense Refill  . apixaban (ELIQUIS) 5 MG TABS tablet Take 1 tablet (5 mg total) by mouth 2 (two) times daily. 180 tablet 3  . atenolol (TENORMIN) 50 MG tablet Take 1 tablet (50 mg total) by mouth 2 (two) times daily. 60 tablet 5  . Calcium Carb-Cholecalciferol [phone] MG-UNIT TABS  Take 2 tablets by mouth daily.     . diphenoxylate-atropine (LOMOTIL) 2.5-0.025 MG per tablet Take 1 tablet by mouth as needed for diarrhea or loose stools. 30 tablet 4  . furosemide (LASIX) 20 MG tablet Take 1 tablet (20 mg total) by mouth daily as needed (for fluid retention, or weight gain of 3-5lb). 90 tablet 3  . methimazole (TAPAZOLE) 5 MG tablet Take 0.5 tablets (2.5 mg total) by mouth daily. 45 tablet 5  . potassium chloride (KLOR-CON) 10 MEQ tablet TAKE 1 TABLET BY MOUTH DAILY AS NEEDED (TAKE 1 TABLET WHEN TAKING A DOSE OF LASIX/FUROSEMIDE.). 90 tablet 3  . temazepam (RESTORIL) 15 MG capsule Take 15 mg by mouth at bedtime as needed for sleep.    . Vitamin D, Cholecalciferol, 25 MCG (1000 UT) CAPS Take 1,000 Units by mouth daily.     No current facility-administered medications on file prior to visit.   Allergies  Allergen Reactions  . Amoxicillin Rash   Family History  Problem Relation Age of Onset  . Vascular Disease Mother   . Diabetes Father   . Dementia Father   . Lymphoma Sister     PE: BP 140/70   Pulse 60   Ht 5\' 4"  (1.626 m)   Wt 120 lb (54.4 kg)   SpO2 98%   BMI 20.60 kg/m  Wt Readings from Last 3 Encounters:  02/12/20 120 lb (54.4 kg)  10/23/19 110 lb 9.6 oz (50.2 kg)  10/22/19 109 lb (49.4 kg)   Constitutional: thin,  in NAD Eyes: PERRLA, EOMI, no exophthalmos ENT: moist mucous membranes, no thyromegaly, no cervical lymphadenopathy Cardiovascular: RRR, No MRG Respiratory: CTA B Gastrointestinal: abdomen soft, NT, ND, BS+ Musculoskeletal: no deformities, strength intact in all 4 Skin: moist, warm, no rashes Neurological: no tremor with outstretched hands, DTR normal in all 4  ASSESSMENT: 1.  Graves' disease  PLAN:  1. Patient with a relatively recently found low TSH and elevated free T4 and free T3 during admission for A. fib with RVR, with thyrotoxic symptoms initially: Palpitations, shortness of breath, mild weight loss, fatigue, insomnia.  She also had diarrhea but this is longstanding in the setting of IBS.  She was started on methimazole and atenolol in 09/2019, after which she started to feel better right away, with resolved palpitation and shortness of breath.  At last visit, we were able to decrease the methimazole dose as her TFTs improved already.  We continued to check her TFTs and decrease her methimazole dose gradually to 2.5 mg daily when a TSH returned higher than normal (last dose change last month). -We diagnosed her with Graves' disease based on elevated TSI antibodies -However, at this visit, she describes that she is feeling more fatigued and she has to clear her voice more frequently.  She also complains of 10 pound weight gain. -At this visit, we discussed about checking a TSH, free T4, free T3 and may need to stop methimazole if these are normal or if the TSH is elevated.   -We discussed that her fatigue and weight gain could also be related to atenolol.  At today's visit her pulse is low, at 60.  I advised her to contact cardiology and see if she can decrease the dose. -No need for RAI treatment or surgery for now for her.  We may need to increase the dose of methimazole back if her thyrotoxicosis returns. -Also no need to repeat a thyroid ultrasound.  There were no thyroid nodules on  her ultrasound from 2016. -No Graves' ophthalmopathy signs: No double vision, blurry vision, eye pain, chemosis -I will see her back in 3 months but probably sooner for labs.  Component     Latest Ref Rng & Units 02/12/2020  TSH     0.35 - 4.50 uIU/mL 0.35  T4,Free(Direct)     0.60 - 1.60 ng/dL 1.610.91  Triiodothyronine,Free,Serum     2.3 - 4.2 pg/mL 2.8  Thyroid tests are normal but the TSH is at the lower limit of normal. For now, I would continue the same dose of methimazole and repeat her tests in 1 month.  Carlus Pavlovristina Jewelle Whitner, MD PhD [facility]

## 2020-02-20 DIAGNOSIS — H04123 Dry eye syndrome of bilateral lacrimal glands: Secondary | ICD-10-CM | POA: Diagnosis not present

## 2020-03-04 DIAGNOSIS — Z23 Encounter for immunization: Secondary | ICD-10-CM | POA: Diagnosis not present

## 2020-03-04 DIAGNOSIS — R69 Illness, unspecified: Secondary | ICD-10-CM | POA: Diagnosis not present

## 2020-03-17 ENCOUNTER — Other Ambulatory Visit (INDEPENDENT_AMBULATORY_CARE_PROVIDER_SITE_OTHER): Payer: Medicare HMO

## 2020-03-17 ENCOUNTER — Other Ambulatory Visit: Payer: Self-pay

## 2020-03-17 DIAGNOSIS — E0500 Thyrotoxicosis with diffuse goiter without thyrotoxic crisis or storm: Secondary | ICD-10-CM | POA: Diagnosis not present

## 2020-03-17 LAB — TSH: TSH: 0.03 u[IU]/mL — ABNORMAL LOW (ref 0.35–4.50)

## 2020-03-17 LAB — T4, FREE: Free T4: 0.94 ng/dL (ref 0.60–1.60)

## 2020-03-17 LAB — T3, FREE: T3, Free: 3.4 pg/mL (ref 2.3–4.2)

## 2020-03-18 ENCOUNTER — Other Ambulatory Visit: Payer: Self-pay | Admitting: Internal Medicine

## 2020-03-18 MED ORDER — METHIMAZOLE 5 MG PO TABS: 5.0000 mg | ORAL_TABLET | Freq: Every day | ORAL | 5 refills | Status: DC

## 2020-03-27 ENCOUNTER — Other Ambulatory Visit: Payer: Self-pay | Admitting: Physician Assistant

## 2020-04-21 NOTE — Progress Notes (Signed)
Cardiology Office Note   Date:  04/22/2020    ID:  Jasma, Seevers [birth year], MRN [id number]   PCP:  [name], MD  Cardiologist:   Rollene Rotunda, MD   Chief Complaint  Patient presents with  . Atrial Fibrillation      History of Present Illness: Lynn Hayden is a 79 y.o. female who presents for follow up of persistent atrial fib. She was hospitalized for this and was found to have hyperthyroidism.  She was treated for this and was going to have cardioversion but converted eventually no her own.    Since I last saw her she had tragedy in her family.  Her 29 year old granddaughter died in a motorcycle accident.  During the stress of all of this she did feel some rapid heart rates.  However, she is not sure that she has been in fibrillation.  She has not had any presyncope or syncope.  She denies any chest pressure, neck or arm discomfort.  She has had no shortness of breath, PND or orthopnea.  She has there have been no weight gain or edema.  Past Medical History:  Diagnosis Date  . Arthritis   . Chronic diarrhea   . Chronic diastolic CHF (congestive heart failure) (HCC)   . Hemorrhoid   . Hyperthyroidism   . Hypertrophic cardiomegaly    a. history of hypertrophic cardiomyopathy (mild septal focal hypertrophy) by echo in 2015 but this has not been seen on more recent echoes.  . IBS (irritable bowel syndrome)   . Insomnia   . Miscarriage   . Osteoporosis   . Persistent atrial fibrillation (HCC)   . Pulmonary hypertension (HCC)   . Thyromegaly   . Vitamin D deficiency     Past Surgical History:  Procedure Laterality Date  . CATARACT EXTRACTION    . DILATION AND CURETTAGE OF UTERUS    . EAR CYST EXCISION  11/09/2011   Procedure: CYST REMOVAL;  Surgeon: Tami Ribas, MD;  Location: [facility];  Service: Orthopedics;  Laterality: Right;  right index excision cyst/foreign body and debridement DIP joint  . GANGLION CYST EXCISION     rt wrist   . GANGLION CYST EXCISION    . TONSILLECTOMY    . WISDOM TOOTH EXTRACTION       Current Outpatient Medications  Medication Sig Dispense Refill  . apixaban (ELIQUIS) 5 MG TABS tablet Take 1 tablet (5 mg total) by mouth 2 (two) times daily. 180 tablet 3  . atenolol (TENORMIN) 50 MG tablet Take 1 tablet (50 mg total) by mouth daily. 90 tablet 3  . Calcium Carb-Cholecalciferol [phone] MG-UNIT TABS Take 2 tablets by mouth daily.     . diphenoxylate-atropine (LOMOTIL) 2.5-0.025 MG per tablet Take 1 tablet by mouth as needed for diarrhea or loose stools. 30 tablet 4  . furosemide (LASIX) 20 MG tablet Take 1 tablet (20 mg total) by mouth daily as needed (for fluid retention, or weight gain of 3-5lb). 90 tablet 3  . methimazole (TAPAZOLE) 5 MG tablet Take 1 tablet (5 mg total) by mouth daily. 45 tablet 5  . potassium chloride (KLOR-CON) 10 MEQ tablet TAKE 1 TABLET BY MOUTH DAILY AS NEEDED (TAKE 1 TABLET WHEN TAKING A DOSE OF LASIX/FUROSEMIDE.). 90 tablet 3  . temazepam (RESTORIL) 15 MG capsule Take 15 mg by mouth at bedtime as needed for sleep.    . Vitamin D, Cholecalciferol, 25 MCG (1000 UT) CAPS Take 1,000 Units by mouth  daily.     No current facility-administered medications for this visit.    Allergies:   Amoxicillin   ROS:  Please see the history of present illness.   Otherwise, review of systems are positive for none.   All other systems are reviewed and negative.    PHYSICAL EXAM: VS:  BP 121/76   Pulse (!) 48   Temp (!) 96.3 F (35.7 C)   Ht 5\' 4"  (1.626 m)   Wt 123 lb 9.6 oz (56.1 kg)   SpO2 96%   BMI 21.22 kg/m  , BMI Body mass index is 21.22 kg/m. GENERAL:  Well appearing NECK:  No jugular venous distention, waveform within normal limits, carotid upstroke brisk and symmetric, no bruits, no thyromegaly LUNGS:  Clear to auscultation bilaterally CHEST:  Unremarkable HEART:  PMI not displaced or sustained,S1 and S2 within normal limits, no S3, no S4, no clicks, no rubs, no  murmurs ABD:  Flat, positive bowel sounds normal in frequency in pitch, no bruits, no rebound, no guarding, no midline pulsatile mass, no hepatomegaly, no splenomegaly EXT:  2 plus pulses throughout, no edema, no cyanosis no clubbing  EKG:  EKG is  ordered today. The ekg ordered today demonstrates sinus bradycardia, rate 48, axis within normal limits, intervals within normal limits, no acute ST-T wave changes.   Recent Labs: 09/30/2019: B Natriuretic Peptide 433.8 10/05/2019: ALT 18; Hemoglobin 12.2; Platelets 209 10/07/2019: BUN 17; Creatinine, Ser 0.73; Magnesium 2.0; Potassium 4.6; Sodium 136 03/17/2020: TSH 0.03    Lipid Panel No results found for: CHOL, TRIG, HDL, CHOLHDL, VLDL, LDLCALC, LDLDIRECT    Wt Readings from Last 3 Encounters:  04/22/20 123 lb 9.6 oz (56.1 kg)  02/12/20 120 lb (54.4 kg)  10/23/19 110 lb 9.6 oz (50.2 kg)      Other studies Reviewed: Additional studies/ records that were reviewed today include: None. Review of the above records demonstrates:  NA   ASSESSMENT AND PLAN:  ATRIAL FIB:     She seems to be maintaining sinus rhythm.  She is in sinus bradycardia.  She is going to reduce her atenolol to 50 mg once daily.  She will remain on the Eliquis.  She is going to get a pulse oximeter and and Alive Cor.  If when I see her in May after her 80th birthday she has had absolutely no sign of fibrillation I might discontinue the anticoagulation.  At the least I will need to turn down to the 2.5 mg twice daily at that point.   HYPERTHYROIDISM:    This is being regulated by Dr.Gherghe.    COVID EDUCATION: She has had her vaccine.   Current medicines are reviewed at length with the patient today.  The patient does not have concerns regarding medicines.  The following changes have been made:  no change  Labs/ tests ordered today include: None  Orders Placed This Encounter  Procedures  . EKG 12-Lead     Disposition:   FU with me in six months.      Signed, June, MD  04/22/2020 10:48 AM    [facility]

## 2020-04-22 ENCOUNTER — Ambulatory Visit: Payer: Medicare HMO | Admitting: Cardiology

## 2020-04-22 ENCOUNTER — Encounter: Payer: Self-pay | Admitting: Cardiology

## 2020-04-22 ENCOUNTER — Other Ambulatory Visit: Payer: Self-pay

## 2020-04-22 VITALS — BP 121/76 | HR 48 | Temp 96.3°F | Ht 64.0 in | Wt 123.6 lb

## 2020-04-22 DIAGNOSIS — I480 Paroxysmal atrial fibrillation: Secondary | ICD-10-CM | POA: Diagnosis not present

## 2020-04-22 MED ORDER — ATENOLOL 50 MG PO TABS: 50.0000 mg | ORAL_TABLET | Freq: Every day | ORAL | 3 refills | Status: AC

## 2020-04-22 NOTE — Patient Instructions (Signed)
Medication Instructions:  Decrease Atenolol to 50mg  daily *If you need a refill on your cardiac medications before your next appointment, please call your pharmacy*  Lab Work: None ordered this visit  Testing/Procedures: None ordered this visit  Follow-Up: At [facility], you and your health needs are our priority.  As part of our continuing mission to provide you with exceptional heart care, we have created designated Provider Care Teams.  These Care Teams include your primary Cardiologist (physician) and Advanced Practice Providers (APPs -  Physician Assistants and Nurse Practitioners) who all work together to provide you with the care you need, when you need it.  Your next appointment:   6 month(s)  The format for your next appointment:   In Person  Provider:   [facility], MD  Other Instructions Purchase a Pulse Oximeter and Alive Cor

## 2020-05-11 ENCOUNTER — Encounter: Payer: Self-pay | Admitting: Internal Medicine

## 2020-05-12 ENCOUNTER — Encounter: Payer: Self-pay | Admitting: Internal Medicine

## 2020-05-12 ENCOUNTER — Ambulatory Visit (INDEPENDENT_AMBULATORY_CARE_PROVIDER_SITE_OTHER): Payer: Medicare HMO | Admitting: Internal Medicine

## 2020-05-12 ENCOUNTER — Other Ambulatory Visit: Payer: Self-pay

## 2020-05-12 VITALS — BP 128/80 | HR 60 | Ht 64.0 in | Wt 126.6 lb

## 2020-05-12 DIAGNOSIS — E0500 Thyrotoxicosis with diffuse goiter without thyrotoxic crisis or storm: Secondary | ICD-10-CM | POA: Diagnosis not present

## 2020-05-12 LAB — TSH: TSH: 6.28 u[IU]/mL — ABNORMAL HIGH (ref 0.35–4.50)

## 2020-05-12 LAB — T3, FREE: T3, Free: 2.3 pg/mL (ref 2.3–4.2)

## 2020-05-12 NOTE — Progress Notes (Signed)
Patient ID: Lynn Hayden, female   DOB: 1941-01-18, 79 y.o.   MRN: [id number]   This visit occurred during the SARS-CoV-2 public health emergency.  Safety protocols were in place, including screening questions prior to the visit, additional usage of staff PPE, and extensive cleaning of exam room while observing appropriate contact time as indicated for disinfecting solutions.   HPI  Lynn Hayden is a 79 y.o.-year-old female, initially referred by her cardiologist, Dr. Antoine Poche, returning for follow-up for thyrotoxicosis.  Last visit 3 months ago.  Reviewed and addended history: Patient has a history of A. fib and was admitted with RVR twice in the last 2 months: 08/25/2018 and 09/30/2019.  During her admission from 09/2019, she was found to be thyrotoxic. Retrospectively, she had SOB, palpitations for at least 6 months.   On 10/07/2019, She was started on: - Methimazole 10 mg 2x a day - Atenolol 50 mg 2x a day  10/22/2019: Decreased methimazole to 5 mg twice daily  11/26/2019: Decreased methimazole to 5 mg daily  01/12/2020: Decreased methimazole to 2.5 mg daily  03/17/2020: Increased methimazole to 5 mg daily  She felt much better after starting methimazole, without shortness of breath.  At last visit, she felt more fatigued so I advised her to contact cardiology to see if she could decrease her atenolol dose. Now on Atenolol 50 mg 1x a day in am.  Reviewed her TFTs: Lab Results  Component Value Date   TSH 0.03 (L) 03/17/2020   TSH 0.35 02/12/2020   TSH 7.93 (H) 01/22/2020   TSH <0.01 (L) 11/26/2019   TSH <0.01 (L) 10/22/2019   TSH <0.010 (L) 09/30/2019   TSH 1.84 12/21/2014   TSH 1.92 07/01/2014   FREET4 0.94 03/17/2020   FREET4 0.91 02/12/2020   FREET4 0.49 (L) 01/22/2020   FREET4 0.55 (L) 11/26/2019   FREET4 1.29 10/22/2019   FREET4 2.09 (H) 10/06/2019   FREET4 3.62 (H) 10/01/2019   FREET4 0.89 07/01/2014   T3FREE 3.4 03/17/2020   T3FREE 2.8 02/12/2020   T3FREE 2.4  01/22/2020   T3FREE 2.0 (L) 11/26/2019   T3FREE 4.1 10/22/2019   T3FREE 3.6 10/06/2019   The Graves' antibodies were elevated, but improving: Lab Results  Component Value Date   TSI 516 (H) 10/22/2019   TSI 7.24 (H) 10/01/2019  07/01/2014: TPO Abs 1 (<9)  Thyroid ultrasound (05/26/2014) checked due to history of enlarged thyroid was normal: -Normal gland size -No thyroid nodules -No lymphadenopathy  Pt denies: - feeling nodules in neck - hoarseness - dysphagia - choking - SOB with lying down  No FH of thyroid disease or thyroid cancer. No h/o radiation tx to head or neck.  No seaweed or kelp. No recent contrast studies. No herbal supplements. No Biotin use. No recent steroids use.   She has IBS with diarrhea.  She also has a history of osteoporosis. On calcium and vitamin D 1000 units . Not on antiresorptive medicines. Due for a DXA next year.  This is managed by PCP.  ROS: Constitutional: + weight gain/no weight loss, no fatigue, no subjective hyperthermia, no subjective hypothermia Eyes: no blurry vision, no xerophthalmia ENT: no sore throat, + see HPI Cardiovascular: no CP/no SOB/no palpitations/no leg swelling Respiratory: no cough/no SOB/no wheezing Gastrointestinal: no N/no V/no D/no C/no acid reflux Musculoskeletal: no muscle aches/no joint aches Skin: no rashes, no hair loss Neurological: no tremors/no numbness/no tingling/no dizziness  I reviewed pt's medications, allergies, PMH, social hx, family hx, and changes were  documented in the history of present illness. Otherwise, unchanged from my initial visit note.  Past Medical History:  Diagnosis Date  . Arthritis   . Chronic diarrhea   . Chronic diastolic CHF (congestive heart failure) (HCC)   . Hemorrhoid   . Hyperthyroidism   . Hypertrophic cardiomegaly    a. history of hypertrophic cardiomyopathy (mild septal focal hypertrophy) by echo in 2015 but this has not been seen on more recent echoes.  . IBS  (irritable bowel syndrome)   . Insomnia   . Miscarriage   . Osteoporosis   . Persistent atrial fibrillation (HCC)   . Pulmonary hypertension (HCC)   . Thyromegaly   . Vitamin D deficiency    Past Surgical History:  Procedure Laterality Date  . CATARACT EXTRACTION    . DILATION AND CURETTAGE OF UTERUS    . EAR CYST EXCISION  11/09/2011   Procedure: CYST REMOVAL;  Surgeon: Tami Ribas, MD;  Location: [facility];  Service: Orthopedics;  Laterality: Right;  right index excision cyst/foreign body and debridement DIP joint  . GANGLION CYST EXCISION     rt wrist  . GANGLION CYST EXCISION    . TONSILLECTOMY    . WISDOM TOOTH EXTRACTION     Social History   Socioeconomic History  . Marital status: Married    Spouse name: Not on file  . Number of children: 3  . Years of education: Not on file  . Highest education level: Not on file  Occupational History    Comment: retired  Tobacco Use  . Smoking status: Former Smoker    Quit date: 11/06/1988    Years since quitting: 31.5  . Smokeless tobacco: Never Used  Substance and Sexual Activity  . Alcohol use: Yes    Comment: rare  . Drug use: No  . Sexual activity: Not Currently  Other Topics Concern  . Not on file  Social History Narrative   Lives with husband.  Has 2 biological children & 1 adopted child.  Takes care of disables son.  Still works and does all the housework   Social Determinants of Corporate investment banker Strain:   . Difficulty of Paying Living Expenses: Not on file  Food Insecurity:   . Worried About Programme researcher, broadcasting/film/video in the Last Year: Not on file  . Ran Out of Food in the Last Year: Not on file  Transportation Needs:   . Lack of Transportation (Medical): Not on file  . Lack of Transportation (Non-Medical): Not on file  Physical Activity:   . Days of Exercise per Week: Not on file  . Minutes of Exercise per Session: Not on file  Stress:   . Feeling of Stress : Not on file  Social  Connections:   . Frequency of Communication with Friends and Family: Not on file  . Frequency of Social Gatherings with Friends and Family: Not on file  . Attends Religious Services: Not on file  . Active Member of Clubs or Organizations: Not on file  . Attends Banker Meetings: Not on file  . Marital Status: Not on file  Intimate Partner Violence:   . Fear of Current or Ex-Partner: Not on file  . Emotionally Abused: Not on file  . Physically Abused: Not on file  . Sexually Abused: Not on file   Current Outpatient Medications on File Prior to Visit  Medication Sig Dispense Refill  . apixaban (ELIQUIS) 5 MG TABS tablet Take 1  tablet (5 mg total) by mouth 2 (two) times daily. 180 tablet 3  . atenolol (TENORMIN) 50 MG tablet Take 1 tablet (50 mg total) by mouth daily. 90 tablet 3  . Calcium Carb-Cholecalciferol [phone] MG-UNIT TABS Take 2 tablets by mouth daily.     . diphenoxylate-atropine (LOMOTIL) 2.5-0.025 MG per tablet Take 1 tablet by mouth as needed for diarrhea or loose stools. 30 tablet 4  . furosemide (LASIX) 20 MG tablet Take 1 tablet (20 mg total) by mouth daily as needed (for fluid retention, or weight gain of 3-5lb). 90 tablet 3  . methimazole (TAPAZOLE) 5 MG tablet Take 1 tablet (5 mg total) by mouth daily. 45 tablet 5  . potassium chloride (KLOR-CON) 10 MEQ tablet TAKE 1 TABLET BY MOUTH DAILY AS NEEDED (TAKE 1 TABLET WHEN TAKING A DOSE OF LASIX/FUROSEMIDE.). 90 tablet 3  . temazepam (RESTORIL) 15 MG capsule Take 15 mg by mouth at bedtime as needed for sleep.    . Vitamin D, Cholecalciferol, 25 MCG (1000 UT) CAPS Take 1,000 Units by mouth daily.     No current facility-administered medications on file prior to visit.   Allergies  Allergen Reactions  . Amoxicillin Rash   Family History  Problem Relation Age of Onset  . Vascular Disease Mother   . Diabetes Father   . Dementia Father   . Lymphoma Sister     PE: BP 128/80   Pulse 60   Ht 5\' 4"  (1.626 m)    Wt 126 lb 9.6 oz (57.4 kg)   SpO2 97%   BMI 21.73 kg/m  Wt Readings from Last 3 Encounters:  05/12/20 126 lb 9.6 oz (57.4 kg)  04/22/20 123 lb 9.6 oz (56.1 kg)  02/12/20 120 lb (54.4 kg)   Constitutional: normal weight, in NAD Eyes: PERRLA, EOMI, no exophthalmos ENT: moist mucous membranes, no thyromegaly, no cervical lymphadenopathy Cardiovascular: RRR, No MRG Respiratory: CTA B Gastrointestinal: abdomen soft, NT, ND, BS+ Musculoskeletal: no deformities, strength intact in all 4 Skin: moist, warm, no rashes Neurological: no tremor with outstretched hands, DTR normal in all 4  ASSESSMENT: 1.  Graves' disease  PLAN:  1. Patient with history of thyrotoxicosis (low TSH, high free thyroid hormones) found during an admission for A. fib with RVR, with thyrotoxic symptoms: Mild weight loss, palpitations, shortness of breath, fatigue, insomnia, diarrhea (but this was longstanding in the setting of IBS). TSI antibodies were elevated, pointing towards a diagnosis of Graves' disease.  She was started on methimazole treatment and atenolol in 09/2019, after which she started to feel better right away, with resolved palpitations and shortness of breath.  Afterwards, we were able to decrease the methimazole dose gradually to 2.5 mg daily.  We continued the same dose in 02/2020 but TSH was decreased in 03/2020 so increase methimazole to 5 mg daily at that time. -she tolerates methimazole well, without side effects -she continues on 5 mg of methimazole daily -We did discuss about definitive treatment Graves' disease to include RAI treatment ablation or surgery, however, as sheis responding well to methimazole, we decided to continue this -At today's visit, she does not complain of thyrotoxic symptoms.  She had weight gain of 10 pounds and fatigue at last visit and her pulse was in the 60s so I advised her to contact cardiology and see if she could decrease her atenolol dose. They did and she is now on  50 mg daily. The dose may be adjusted again at next OV in 10/2020. We  did discuss about the fact that Atenolol can cause fatigue and wt gain, also. -Of note, she had a thyroid ultrasound in 2015 and there were no thyroid nodules at that time -Also, she does not have any signs of Graves' ophthalmopathy: double vision, blurry vision, eye pain, chemosis -We will recheck TFTs today and will adjust the methimazole dose accordingly -I will see her back in 6 months, but likely sooner for labs.  Component     Latest Ref Rng & Units 05/12/2020  TSH     0.35 - 4.50 uIU/mL 6.28 (H)  T4,Free(Direct)     0.60 - 1.60 ng/dL 5.36  Triiodothyronine,Free,Serum     2.3 - 4.2 pg/mL 2.3   TSH elevated.  We will need to try to decrease the methimazole to 2.5 mg daily and repeat her test in 4 weeks.  Carlus Pavlov, MD PhD [facility]

## 2020-05-12 NOTE — Patient Instructions (Addendum)
Please continue the methimazole at 5 mg daily.  Please stop at the lab.  Please come back for a follow-up appointment in 6 months.

## 2020-05-13 DIAGNOSIS — H903 Sensorineural hearing loss, bilateral: Secondary | ICD-10-CM | POA: Diagnosis not present

## 2020-05-13 DIAGNOSIS — H6123 Impacted cerumen, bilateral: Secondary | ICD-10-CM | POA: Diagnosis not present

## 2020-05-13 DIAGNOSIS — H6980 Other specified disorders of Eustachian tube, unspecified ear: Secondary | ICD-10-CM | POA: Diagnosis not present

## 2020-05-13 LAB — T4, FREE: Free T4: 0.67 ng/dL (ref 0.60–1.60)

## 2020-05-14 MED ORDER — METHIMAZOLE 5 MG PO TABS: 2.5000 mg | ORAL_TABLET | Freq: Every day | ORAL | 5 refills | Status: DC

## 2020-06-11 DIAGNOSIS — M810 Age-related osteoporosis without current pathological fracture: Secondary | ICD-10-CM | POA: Diagnosis not present

## 2020-06-11 DIAGNOSIS — G4700 Insomnia, unspecified: Secondary | ICD-10-CM | POA: Diagnosis not present

## 2020-06-11 DIAGNOSIS — Z0000 Encounter for general adult medical examination without abnormal findings: Secondary | ICD-10-CM | POA: Diagnosis not present

## 2020-06-11 DIAGNOSIS — E0500 Thyrotoxicosis with diffuse goiter without thyrotoxic crisis or storm: Secondary | ICD-10-CM | POA: Diagnosis not present

## 2020-06-11 DIAGNOSIS — I480 Paroxysmal atrial fibrillation: Secondary | ICD-10-CM | POA: Diagnosis not present

## 2020-06-11 DIAGNOSIS — K589 Irritable bowel syndrome without diarrhea: Secondary | ICD-10-CM | POA: Diagnosis not present

## 2020-06-11 DIAGNOSIS — Z7901 Long term (current) use of anticoagulants: Secondary | ICD-10-CM | POA: Diagnosis not present

## 2020-06-16 ENCOUNTER — Other Ambulatory Visit: Payer: Self-pay | Admitting: Internal Medicine

## 2020-06-16 ENCOUNTER — Other Ambulatory Visit: Payer: Self-pay

## 2020-06-16 ENCOUNTER — Other Ambulatory Visit (INDEPENDENT_AMBULATORY_CARE_PROVIDER_SITE_OTHER): Payer: Medicare HMO

## 2020-06-16 DIAGNOSIS — E0500 Thyrotoxicosis with diffuse goiter without thyrotoxic crisis or storm: Secondary | ICD-10-CM | POA: Diagnosis not present

## 2020-06-16 LAB — T3, FREE: T3, Free: 3.2 pg/mL (ref 2.3–4.2)

## 2020-06-16 LAB — T4, FREE: Free T4: 1.07 ng/dL (ref 0.60–1.60)

## 2020-06-16 LAB — TSH: TSH: 0.94 u[IU]/mL (ref 0.35–4.50)

## 2020-06-18 DIAGNOSIS — Z1211 Encounter for screening for malignant neoplasm of colon: Secondary | ICD-10-CM | POA: Diagnosis not present

## 2020-06-28 DIAGNOSIS — M810 Age-related osteoporosis without current pathological fracture: Secondary | ICD-10-CM | POA: Diagnosis not present

## 2020-08-18 ENCOUNTER — Other Ambulatory Visit: Payer: Self-pay

## 2020-08-18 ENCOUNTER — Other Ambulatory Visit (INDEPENDENT_AMBULATORY_CARE_PROVIDER_SITE_OTHER): Payer: Medicare HMO

## 2020-08-18 DIAGNOSIS — E0500 Thyrotoxicosis with diffuse goiter without thyrotoxic crisis or storm: Secondary | ICD-10-CM | POA: Diagnosis not present

## 2020-08-18 LAB — T4, FREE: Free T4: 0.84 ng/dL (ref 0.60–1.60)

## 2020-08-18 LAB — T3, FREE: T3, Free: 2.9 pg/mL (ref 2.3–4.2)

## 2020-08-18 LAB — TSH: TSH: 0.46 u[IU]/mL (ref 0.35–4.50)

## 2020-08-19 ENCOUNTER — Encounter: Payer: Self-pay | Admitting: Internal Medicine

## 2020-08-19 ENCOUNTER — Other Ambulatory Visit: Payer: Self-pay | Admitting: Internal Medicine

## 2020-08-19 DIAGNOSIS — E0500 Thyrotoxicosis with diffuse goiter without thyrotoxic crisis or storm: Secondary | ICD-10-CM

## 2020-08-23 ENCOUNTER — Other Ambulatory Visit: Payer: Self-pay

## 2020-08-23 MED ORDER — APIXABAN 5 MG PO TABS: 5.0000 mg | ORAL_TABLET | Freq: Two times a day (BID) | ORAL | 3 refills | Status: DC

## 2020-08-26 DIAGNOSIS — Z79899 Other long term (current) drug therapy: Secondary | ICD-10-CM | POA: Diagnosis not present

## 2020-08-27 DIAGNOSIS — I480 Paroxysmal atrial fibrillation: Secondary | ICD-10-CM | POA: Diagnosis not present

## 2020-08-27 DIAGNOSIS — G4700 Insomnia, unspecified: Secondary | ICD-10-CM | POA: Diagnosis not present

## 2020-08-27 DIAGNOSIS — H04123 Dry eye syndrome of bilateral lacrimal glands: Secondary | ICD-10-CM | POA: Diagnosis not present

## 2020-08-27 DIAGNOSIS — M810 Age-related osteoporosis without current pathological fracture: Secondary | ICD-10-CM | POA: Diagnosis not present

## 2020-09-10 DIAGNOSIS — M810 Age-related osteoporosis without current pathological fracture: Secondary | ICD-10-CM | POA: Diagnosis not present

## 2020-09-17 DIAGNOSIS — M810 Age-related osteoporosis without current pathological fracture: Secondary | ICD-10-CM | POA: Diagnosis not present

## 2020-09-17 DIAGNOSIS — I480 Paroxysmal atrial fibrillation: Secondary | ICD-10-CM | POA: Diagnosis not present

## 2020-09-17 DIAGNOSIS — G4700 Insomnia, unspecified: Secondary | ICD-10-CM | POA: Diagnosis not present

## 2020-09-22 DIAGNOSIS — H6123 Impacted cerumen, bilateral: Secondary | ICD-10-CM | POA: Diagnosis not present

## 2020-09-29 ENCOUNTER — Other Ambulatory Visit (INDEPENDENT_AMBULATORY_CARE_PROVIDER_SITE_OTHER): Payer: Medicare HMO

## 2020-09-29 ENCOUNTER — Other Ambulatory Visit: Payer: Self-pay

## 2020-09-29 DIAGNOSIS — E0500 Thyrotoxicosis with diffuse goiter without thyrotoxic crisis or storm: Secondary | ICD-10-CM | POA: Diagnosis not present

## 2020-09-29 LAB — T3, FREE: T3, Free: 3.1 pg/mL (ref 2.3–4.2)

## 2020-09-29 LAB — TSH: TSH: 0.47 u[IU]/mL (ref 0.35–4.50)

## 2020-09-29 LAB — T4, FREE: Free T4: 0.97 ng/dL (ref 0.60–1.60)

## 2020-10-04 NOTE — Progress Notes (Signed)
Cardiology Office Note   Date:  10/06/2020    ID:  Lynn, Hayden 12-Jun-1940, MRN [id number]   PCP:  Lynn Rua, MD  Cardiologist:   Rollene Rotunda, MD   Chief Complaint  Patient presents with  . Atrial Fibrillation      History of Present Illness: Lynn Hayden is a 80 y.o. female who presents for follow up of persistent atrial fib. She was hospitalized for this and was found to have hyperthyroidism.  She was treated for this and was going to have cardioversion but converted eventually no her own.    Since I last saw her she has had no further suggestions of atrial fibrillation.  He does not feel palpitations.  She checks her pulse ox and her heart rate stays in the 60s typically.  She did feel her fibrillation when she was in it previously.  She thinks she would be able to feel it now.  She likes being in the yard and being active.  She denies any new cardiovascular symptoms such as chest pressure, neck or arm discomfort.  She has had no new shortness of breath, PND or orthopnea.  He has had no weight gain or edema.   Past Medical History:  Diagnosis Date  . Arthritis   . Chronic diarrhea   . Chronic diastolic CHF (congestive heart failure) (HCC)   . Hemorrhoid   . Hyperthyroidism   . Hypertrophic cardiomegaly    a. history of hypertrophic cardiomyopathy (mild septal focal hypertrophy) by echo in 2015 but this has not been seen on more recent echoes.  . IBS (irritable bowel syndrome)   . Insomnia   . Miscarriage   . Osteoporosis   . Persistent atrial fibrillation (HCC)   . Pulmonary hypertension (HCC)   . Thyromegaly   . Vitamin D deficiency     Past Surgical History:  Procedure Laterality Date  . CATARACT EXTRACTION    . DILATION AND CURETTAGE OF UTERUS    . EAR CYST EXCISION  11/09/2011   Procedure: CYST REMOVAL;  Surgeon: Lynn Ribas, MD;  Location: [facility];  Service: Orthopedics;  Laterality: Right;  right index excision  cyst/foreign body and debridement DIP joint  . GANGLION CYST EXCISION     rt wrist  . GANGLION CYST EXCISION    . TONSILLECTOMY    . WISDOM TOOTH EXTRACTION       Current Outpatient Medications  Medication Sig Dispense Refill  . atenolol (TENORMIN) 50 MG tablet Take 1 tablet (50 mg total) by mouth daily. 90 tablet 3  . Calcium Carb-Cholecalciferol [phone] MG-UNIT TABS Take 2 tablets by mouth daily.     Marland Kitchen denosumab (PROLIA) 60 MG/ML SOSY injection Inject 60 mg into the skin every 6 (six) months.    . diphenoxylate-atropine (LOMOTIL) 2.5-0.025 MG per tablet Take 1 tablet by mouth as needed for diarrhea or loose stools. 30 tablet 4  . furosemide (LASIX) 20 MG tablet Take 1 tablet (20 mg total) by mouth daily as needed (for fluid retention, or weight gain of 3-5lb). 90 tablet 3  . methimazole (TAPAZOLE) 5 MG tablet Take 0.5 tablets (2.5 mg total) by mouth daily. (Patient taking differently: Take 2.5 mg by mouth daily. Takes Every Other Day) 45 tablet 5  . potassium chloride (KLOR-CON) 10 MEQ tablet TAKE 1 TABLET BY MOUTH DAILY AS NEEDED (TAKE 1 TABLET WHEN TAKING A DOSE OF LASIX/FUROSEMIDE.). 90 tablet 3  . temazepam (RESTORIL) 15 MG capsule Take  15 mg by mouth at bedtime as needed for sleep.    . Vitamin D, Cholecalciferol, 25 MCG (1000 UT) CAPS Take 1,000 Units by mouth daily.     No current facility-administered medications for this visit.    Allergies:   Amoxicillin   ROS:  Please see the history of present illness.   Otherwise, review of systems are positive for none.   All other systems are reviewed and negative.    PHYSICAL EXAM: VS:  BP 140/80 (BP Location: Left Arm, Patient Position: Sitting, Cuff Size: Normal)   Pulse 60   Ht 5\' 4"  (1.626 m)   Wt 129 lb 12.8 oz (58.9 kg)   BMI 22.28 kg/m  , BMI Body mass index is 22.28 kg/m. GENERAL:  Well appearing NECK:  No jugular venous distention, waveform within normal limits, carotid upstroke brisk and symmetric, no bruits, no  thyromegaly LUNGS:  Clear to auscultation bilaterally CHEST:  Unremarkable HEART:  PMI not displaced or sustained,S1 and S2 within normal limits, no S3, no S4, no clicks, no rubs, no murmurs ABD:  Flat, positive bowel sounds normal in frequency in pitch, no bruits, no rebound, no guarding, no midline pulsatile mass, no hepatomegaly, no splenomegaly EXT:  2 plus pulses throughout, no edema, no cyanosis no clubbing   EKG:  EKG is  ordered today. The ekg ordered today demonstrates sinus rhythm, rate 60, axis within normal limits, intervals within normal limits, no acute ST-T wave changes.   Recent Labs: 09/29/2020: TSH 0.47    Lipid Panel No results found for: CHOL, TRIG, HDL, CHOLHDL, VLDL, LDLCALC, LDLDIRECT    Wt Readings from Last 3 Encounters:  10/06/20 129 lb 12.8 oz (58.9 kg)  05/12/20 126 lb 9.6 oz (57.4 kg)  04/22/20 123 lb 9.6 oz (56.1 kg)      Other studies Reviewed: Additional studies/ records that were reviewed today include: Labs. Review of the above records demonstrates:     ASSESSMENT AND PLAN:  ATRIAL FIB:      Today we had a discussion about her fibrillation which was most definitely related to her hyperthyroidism.  This is now corrected and managed.  She is not feeling any fibrillation and has had no recurrent documentation since she converted.  I think it is prudent for her not to be on anticoagulation any longer as she is low risk for thromboembolism without evidence of ongoing fibrillation.  She will stop the Eliquis.   HYPERTHYROIDISM:     This is being regulated by Dr.Gherghe.    HTN: Her blood pressure is mildly elevated.  Of asked her to keep a bit of a blood pressure diary on it.  Given this I would like to continue the atenolol at current dose.   Current medicines are reviewed at length with the patient today.  The patient does not have concerns regarding medicines.  The following changes have been made: As above  Labs/ tests ordered today include:  None  Orders Placed This Encounter  Procedures  . EKG 12-Lead     Disposition:   FU with me as needed   Signed, 04/24/20, MD  10/06/2020 10:24 AM    [facility]

## 2020-10-06 ENCOUNTER — Other Ambulatory Visit: Payer: Self-pay

## 2020-10-06 ENCOUNTER — Encounter: Payer: Self-pay | Admitting: Cardiology

## 2020-10-06 ENCOUNTER — Ambulatory Visit: Payer: Medicare HMO | Admitting: Cardiology

## 2020-10-06 VITALS — BP 140/80 | HR 60 | Ht 64.0 in | Wt 129.8 lb

## 2020-10-06 DIAGNOSIS — E0590 Thyrotoxicosis, unspecified without thyrotoxic crisis or storm: Secondary | ICD-10-CM

## 2020-10-06 DIAGNOSIS — I480 Paroxysmal atrial fibrillation: Secondary | ICD-10-CM

## 2020-10-06 NOTE — Patient Instructions (Signed)
Medication Instructions:  Stop Eliquis 5 mg  *If you need a refill on your cardiac medications before your next appointment, please call your pharmacy*   Lab Work: None   Testing/Procedures: None   Follow-Up: At [facility], you and your health needs are our priority.  As part of our continuing mission to provide you with exceptional heart care, we have created designated Provider Care Teams.  These Care Teams include your primary Cardiologist (physician) and Advanced Practice Providers (APPs -  Physician Assistants and Nurse Practitioners) who all work together to provide you with the care you need, when you need it.  We recommend signing up for the patient portal called "MyChart".  Sign up information is provided on this After Visit Summary.  MyChart is used to connect with patients for Virtual Visits (Telemedicine).  Patients are able to view lab/test results, encounter notes, upcoming appointments, etc.  Non-urgent messages can be sent to your provider as well.   To learn more about what you can do with MyChart, go to [email or web address].    Your next appointment:   As needed  The format for your next appointment:   In Person  Provider:   Rollene Rotunda, MD

## 2020-11-10 ENCOUNTER — Ambulatory Visit (INDEPENDENT_AMBULATORY_CARE_PROVIDER_SITE_OTHER): Payer: Medicare HMO | Admitting: Internal Medicine

## 2020-11-10 ENCOUNTER — Encounter: Payer: Self-pay | Admitting: Internal Medicine

## 2020-11-10 ENCOUNTER — Other Ambulatory Visit: Payer: Self-pay

## 2020-11-10 VITALS — BP 128/78 | HR 58 | Ht 64.0 in | Wt 126.4 lb

## 2020-11-10 DIAGNOSIS — E0500 Thyrotoxicosis with diffuse goiter without thyrotoxic crisis or storm: Secondary | ICD-10-CM | POA: Diagnosis not present

## 2020-11-10 DIAGNOSIS — I480 Paroxysmal atrial fibrillation: Secondary | ICD-10-CM | POA: Diagnosis not present

## 2020-11-10 LAB — TSH: TSH: 0.6 u[IU]/mL (ref 0.35–4.50)

## 2020-11-10 LAB — T3, FREE: T3, Free: 3 pg/mL (ref 2.3–4.2)

## 2020-11-10 LAB — T4, FREE: Free T4: 0.92 ng/dL (ref 0.60–1.60)

## 2020-11-10 NOTE — Progress Notes (Signed)
Patient ID: Lynn Hayden, female   DOB: [birth year], 80 y.o.   MRN: [id number]   This visit occurred during the SARS-CoV-2 public health emergency.  Safety protocols were in place, including screening questions prior to the visit, additional usage of staff PPE, and extensive cleaning of exam room while observing appropriate contact time as indicated for disinfecting solutions.   HPI  Lynn Hayden is a 80 y.o.-year-old female, initially referred by her cardiologist, Dr. Antoine Poche, returning for follow-up for thyrotoxicosis.  Last visit 6 months ago.  Interim history: She feels more fatigued (in the last 10 years). She continues to have diarrhea and hot flushes, but these are not worse.  Reviewed and addended history: Patient has a history of A. fib and was admitted with RVR twice in the last 2 months: 08/25/2018 and 09/30/2019.  During her admission from 09/2019, she was found to be thyrotoxic. Retrospectively, she had SOB, palpitations for at least 6 months.   On 10/07/2019, She was started on: - Methimazole 10 mg 2x a day - Atenolol 50 mg 2x a day  10/22/2019: Decreased methimazole to 5 mg twice daily  11/26/2019: Decreased methimazole to 5 mg daily  01/12/2020: Decreased methimazole to 2.5 mg daily  03/17/2020: Increased methimazole to 5 mg daily  She felt much better after starting methimazole, without shortness of breath.  02/12/2020: She felt more fatigued so I advised her to contact cardiology to see if she could decrease her atenolol dose>> dose was decreased atenolol 50 mg 1x a day in am.  05/12/2020: Decreased methimazole to 2.5 mg daily  Reviewed her TFTs: Lab Results  Component Value Date   TSH 0.47 09/29/2020   TSH 0.46 08/18/2020   TSH 0.94 06/16/2020   TSH 6.28 (H) 05/12/2020   TSH 0.03 (L) 03/17/2020   TSH 0.35 02/12/2020   TSH 7.93 (H) 01/22/2020   TSH <0.01 (L) 11/26/2019   TSH <0.01 (L) 10/22/2019   TSH <0.010 (L) 09/30/2019   FREET4 0.97 09/29/2020   FREET4 0.84  08/18/2020   FREET4 1.07 06/16/2020   FREET4 0.67 05/12/2020   FREET4 0.94 03/17/2020   FREET4 0.91 02/12/2020   FREET4 0.49 (L) 01/22/2020   FREET4 0.55 (L) 11/26/2019   FREET4 1.29 10/22/2019   FREET4 2.09 (H) 10/06/2019   T3FREE 3.1 09/29/2020   T3FREE 2.9 08/18/2020   T3FREE 3.2 06/16/2020   T3FREE 2.3 05/12/2020   T3FREE 3.4 03/17/2020   T3FREE 2.8 02/12/2020   T3FREE 2.4 01/22/2020   T3FREE 2.0 (L) 11/26/2019   T3FREE 4.1 10/22/2019   T3FREE 3.6 10/06/2019   The Graves' antibodies were elevated, but improving: Lab Results  Component Value Date   TSI 516 (H) 10/22/2019   TSI 7.24 (H) 10/01/2019  07/01/2014: TPO Abs 1 (<9)  Thyroid ultrasound (05/26/2014) checked due to history of enlarged thyroid was normal: -Normal gland size -No thyroid nodules -No lymphadenopathy  Pt denies: - feeling nodules in neck - hoarseness - dysphagia - choking - SOB with lying down  No FH of thyroid disease or thyroid cancer. No h/o radiation tx to head or neck.  No seaweed or kelp. No recent contrast studies. No herbal supplements. No Biotin use. No recent steroids use.   She has IBS with diarrhea.  She also has a history of osteoporosis. On calcium and vitamin D 1000 units . Not on antiresorptive medicines. Due for a DXA next year.  This is managed by PCP.  ROS: Constitutional: no weight gain/no weight loss, +  fatigue, no subjective hyperthermia, no subjective hypothermia Eyes: no blurry vision, no xerophthalmia ENT: no sore throat, + see HPI Cardiovascular: no CP/no SOB/no palpitations/no leg swelling Respiratory: no cough/no SOB/no wheezing Gastrointestinal: no N/no V/+ D/no C/no acid reflux Musculoskeletal: no muscle aches/no joint aches Skin: no rashes, no hair loss Neurological: no tremors/no numbness/no tingling/no dizziness  I reviewed pt's medications, allergies, PMH, social hx, family hx, and changes were documented in the history of present illness. Otherwise,  unchanged from my initial visit note.  Past Medical History:  Diagnosis Date   Arthritis    Chronic diarrhea    Chronic diastolic CHF (congestive heart failure) (HCC)    Hemorrhoid    Hyperthyroidism    Hypertrophic cardiomegaly    a. history of hypertrophic cardiomyopathy (mild septal focal hypertrophy) by echo in 2015 but this has not been seen on more recent echoes.   IBS (irritable bowel syndrome)    Insomnia    Miscarriage    Osteoporosis    Persistent atrial fibrillation (HCC)    Pulmonary hypertension (HCC)    Thyromegaly    Vitamin D deficiency    Past Surgical History:  Procedure Laterality Date   CATARACT EXTRACTION     DILATION AND CURETTAGE OF UTERUS     EAR CYST EXCISION  11/09/2011   Procedure: CYST REMOVAL;  Surgeon: Tami Ribas, MD;  Location: [facility];  Service: Orthopedics;  Laterality: Right;  right index excision cyst/foreign body and debridement DIP joint   GANGLION CYST EXCISION     rt wrist   GANGLION CYST EXCISION     TONSILLECTOMY     WISDOM TOOTH EXTRACTION     Social History   Socioeconomic History   Marital status: Married    Spouse name: Not on file   Number of children: 3   Years of education: Not on file   Highest education level: Not on file  Occupational History    Comment: retired  Tobacco Use   Smoking status: Former Smoker    Quit date: 11/06/1988    Years since quitting: 32.0   Smokeless tobacco: Never Used  Substance and Sexual Activity   Alcohol use: Yes    Comment: rare   Drug use: No   Sexual activity: Not Currently  Other Topics Concern   Not on file  Social History Narrative   Lives with husband.  Has 2 biological children & 1 adopted child.  Takes care of disables son.  Still works and does all the housework   Social Determinants of Corporate investment banker Strain: Not on file  Food Insecurity: Not on file  Transportation Needs: Not on file  Physical Activity: Not on file  Stress: Not on file   Social Connections: Not on file  Intimate Partner Violence: Not on file   Current Outpatient Medications on File Prior to Visit  Medication Sig Dispense Refill   atenolol (TENORMIN) 50 MG tablet Take 1 tablet (50 mg total) by mouth daily. 90 tablet 3   Calcium Carb-Cholecalciferol [phone] MG-UNIT TABS Take 2 tablets by mouth daily.      denosumab (PROLIA) 60 MG/ML SOSY injection Inject 60 mg into the skin every 6 (six) months.     diphenoxylate-atropine (LOMOTIL) 2.5-0.025 MG per tablet Take 1 tablet by mouth as needed for diarrhea or loose stools. 30 tablet 4   furosemide (LASIX) 20 MG tablet Take 1 tablet (20 mg total) by mouth daily as needed (for fluid retention, or weight  gain of 3-5lb). 90 tablet 3   methimazole (TAPAZOLE) 5 MG tablet Take 0.5 tablets (2.5 mg total) by mouth daily. (Patient taking differently: Take 2.5 mg by mouth daily. Takes Every Other Day) 45 tablet 5   potassium chloride (KLOR-CON) 10 MEQ tablet TAKE 1 TABLET BY MOUTH DAILY AS NEEDED (TAKE 1 TABLET WHEN TAKING A DOSE OF LASIX/FUROSEMIDE.). 90 tablet 3   temazepam (RESTORIL) 15 MG capsule Take 15 mg by mouth at bedtime as needed for sleep.     Vitamin D, Cholecalciferol, 25 MCG (1000 UT) CAPS Take 1,000 Units by mouth daily.     No current facility-administered medications on file prior to visit.   Allergies  Allergen Reactions   Amoxicillin Rash   Family History  Problem Relation Age of Onset   Vascular Disease Mother    Diabetes Father    Dementia Father    Lymphoma Sister    PE: BP 128/78 (BP Location: Right Arm, Patient Position: Sitting, Cuff Size: Normal)   Pulse (!) 58   Ht 5\' 4"  (1.626 m)   Wt 126 lb 6.4 oz (57.3 kg)   SpO2 97%   BMI 21.70 kg/m  Wt Readings from Last 3 Encounters:  11/10/20 126 lb 6.4 oz (57.3 kg)  10/06/20 129 lb 12.8 oz (58.9 kg)  05/12/20 126 lb 9.6 oz (57.4 kg)   Constitutional: normal weight, in NAD Eyes: PERRLA, EOMI, no exophthalmos ENT: moist mucous membranes,  no thyromegaly, no cervical lymphadenopathy Cardiovascular: RRR, No MRG Respiratory: CTA B Gastrointestinal: abdomen soft, NT, ND, BS+ Musculoskeletal: no deformities, strength intact in all 4 Skin: moist, warm, no rashes Neurological: no tremor with outstretched hands, DTR normal in all 4  ASSESSMENT: 1.  Graves' disease  2.  Paroxysmal atrial fibrillation  PLAN:  1. Patient with history of thyrotoxicosis (low TSH, high free hormones) found during an admission for A. fib with RVR.  She also had thyrotoxic symptoms: Mild weight loss, palpitations, shortness of breath, fatigue, insomnia, diarrhea (but this was longstanding in the setting of IBS).  TSI antibodies were elevated, pointing towards a diagnosis of Graves' disease.  She was started on methimazole and atenolol 09/2019, after which she started to feel better right away, with resolved palpitations and shortness of breath.  Afterwards, we were able to decrease the methimazole dose gradually to 2.5 mg daily.  In 03/2020, we increased the dose to 5 mg daily, however,In 05/2020, we decreased it back to 2.5 mg daily and the tests remained controlled afterwards. -Reviewed together her latest TFTs from 09/2020 and these were normal -She tolerates methimazole well, without side effects -She continues on 2.5 methimazole daily -We again discussed about definitive treatment for Graves' disease to include RAI treatment ablation or surgery, however, she responded well to methimazole so for now we will continue this -At the end of last year, she did not have thyrotoxic signs or symptoms and even had a weight gain of 10 pounds.  She had some fatigue and pulse was in the 60s so I advised her to contact cardiology to see if she could decrease her atenolol dose.  Afterwards, her dose was decreased to 50 mg daily.  Her weight was stable afterwards.  However, she still has some fatigue. -Of note, she had a thyroid ultrasound in 2016 and there were no new  thyroid nodules at that time -No thyromegaly or thyroid nodules palpated on today's exam -No signs of Graves' ophthalmopathy including blurry vision, double vision, eye pain, chemosis -We will  check her TFTs today and adjust the methimazole dose accordingly.  We discussed that we may need to decrease it to 2.5 mg every other day before stopping it again. -I will see her back in 6 months, possibly sooner for labs  2.  Paroxysmal atrial fibrillation -She denies any palpitations  -NSR on auscultation -at this visit, she tells me that she was discharged from cardiology after last month's visit -We discussed that her pulse today is again quite low, at 58 >> I advised her to try to decrease the beta-blocker dose further, to 25 mg daily.  Advised her that if she starts feeling palpitations or if her pulse at rest is higher than 90, she will need to increase the dose back to 50 mg daily.  Component     Latest Ref Rng & Units 11/10/2020  TSH     0.35 - 4.50 uIU/mL 0.60  T4,Free(Direct)     0.60 - 1.60 ng/dL 2.63  Triiodothyronine,Free,Serum     2.3 - 4.2 pg/mL 3.0  TSI     <140 % baseline 284 (H)  TFTs are normal, while TSI antibodies have improved.  Due to the still elevated TSI antibodies, I would suggest to continue the same dose of methimazole for now.  Carlus Pavlov, MD PhD [facility] Endocrinology

## 2020-11-10 NOTE — Patient Instructions (Addendum)
Please continue the methimazole at 2.5 mg daily.  Try to reduce Atenolol to 25 mg daily.  Please stop at the lab.  Please come back for a follow-up appointment in 6 months.

## 2020-11-12 LAB — THYROID STIMULATING IMMUNOGLOBULIN: TSI: 284 % baseline — ABNORMAL HIGH (ref <140)

## 2020-12-22 DIAGNOSIS — H6123 Impacted cerumen, bilateral: Secondary | ICD-10-CM | POA: Diagnosis not present

## 2021-01-04 DIAGNOSIS — G4700 Insomnia, unspecified: Secondary | ICD-10-CM | POA: Diagnosis not present

## 2021-01-04 DIAGNOSIS — M810 Age-related osteoporosis without current pathological fracture: Secondary | ICD-10-CM | POA: Diagnosis not present

## 2021-01-04 DIAGNOSIS — I480 Paroxysmal atrial fibrillation: Secondary | ICD-10-CM | POA: Diagnosis not present

## 2021-01-17 DIAGNOSIS — Z961 Presence of intraocular lens: Secondary | ICD-10-CM | POA: Diagnosis not present

## 2021-01-17 DIAGNOSIS — H04123 Dry eye syndrome of bilateral lacrimal glands: Secondary | ICD-10-CM | POA: Diagnosis not present

## 2021-01-17 DIAGNOSIS — H524 Presbyopia: Secondary | ICD-10-CM | POA: Diagnosis not present

## 2021-02-15 DIAGNOSIS — Z23 Encounter for immunization: Secondary | ICD-10-CM | POA: Diagnosis not present

## 2021-03-30 DIAGNOSIS — H6123 Impacted cerumen, bilateral: Secondary | ICD-10-CM | POA: Diagnosis not present

## 2021-04-12 DIAGNOSIS — M810 Age-related osteoporosis without current pathological fracture: Secondary | ICD-10-CM | POA: Diagnosis not present

## 2021-05-11 ENCOUNTER — Encounter: Payer: Self-pay | Admitting: Internal Medicine

## 2021-05-11 ENCOUNTER — Other Ambulatory Visit: Payer: Self-pay

## 2021-05-11 ENCOUNTER — Ambulatory Visit: Payer: Medicare HMO | Admitting: Internal Medicine

## 2021-05-11 VITALS — BP 122/80 | HR 54 | Ht 64.0 in | Wt 126.8 lb

## 2021-05-11 DIAGNOSIS — E0500 Thyrotoxicosis with diffuse goiter without thyrotoxic crisis or storm: Secondary | ICD-10-CM

## 2021-05-11 DIAGNOSIS — R079 Chest pain, unspecified: Secondary | ICD-10-CM

## 2021-05-11 LAB — TSH: TSH: 2.48 u[IU]/mL (ref 0.35–5.50)

## 2021-05-11 LAB — T4, FREE: Free T4: 0.9 ng/dL (ref 0.60–1.60)

## 2021-05-11 LAB — T3, FREE: T3, Free: 3 pg/mL (ref 2.3–4.2)

## 2021-05-11 MED ORDER — METHIMAZOLE 5 MG PO TABS: 2.5000 mg | ORAL_TABLET | Freq: Every day | ORAL | 3 refills | Status: DC

## 2021-05-11 NOTE — Progress Notes (Signed)
Patient ID: Lynn Hayden, female   DOB: 1941-03-22, 80 y.o.   MRN: [id number]   This visit occurred during the SARS-CoV-2 public health emergency.  Safety protocols were in place, including screening questions prior to the visit, additional usage of staff PPE, and extensive cleaning of exam room while observing appropriate contact time as indicated for disinfecting solutions.   HPI  Lynn Hayden is a 80 y.o.-year-old female, initially referred by her cardiologist, Dr. Antoine Poche, returning for follow-up for thyrotoxicosis.  Last visit 6 months ago.  Interim history: She continues to have fatigue, which is chronic for her.   She also continues to have diarrhea-on Lomotil also chronic.  No hot flushes. No palpitations (after increasing Atenolol back), anxiety, tremors.  She has chronic insomnia - on Temazepan for 10 years.  Reviewed history: Patient has a history of A. fib and was admitted with RVR twice in the last 2 months: 08/25/2018 and 09/30/2019.  During her admission from 09/2019, she was found to be thyrotoxic. Retrospectively, she had SOB, palpitations for at least 6 months.   On 10/07/2019, She was started on: - Methimazole 10 mg 2x a day - Atenolol 50 mg 2x a day  10/22/2019: Decreased methimazole to 5 mg twice daily  11/26/2019: Decreased methimazole to 5 mg daily  01/12/2020: Decreased methimazole to 2.5 mg daily  03/17/2020: Increased methimazole to 5 mg daily  She felt much better after starting methimazole, without shortness of breath.  02/12/2020: She felt more fatigued so I advised her to contact cardiology to see if she could decrease her atenolol dose>> dose was decreased atenolol 50 mg 1x a day in am.  05/12/2020: Decreased methimazole to 2.5 mg daily  11/2020: Advised her to decrease atenolol to 25 mg daily, but she developed palpitations and increased it back to 50 mg daily.  Reviewed her TFTs: Lab Results  Component Value Date   TSH 0.60 11/10/2020   TSH 0.47  09/29/2020   TSH 0.46 08/18/2020   TSH 0.94 06/16/2020   TSH 6.28 (H) 05/12/2020   TSH 0.03 (L) 03/17/2020   TSH 0.35 02/12/2020   TSH 7.93 (H) 01/22/2020   TSH <0.01 (L) 11/26/2019   TSH <0.01 (L) 10/22/2019   FREET4 0.92 11/10/2020   FREET4 0.97 09/29/2020   FREET4 0.84 08/18/2020   FREET4 1.07 06/16/2020   FREET4 0.67 05/12/2020   FREET4 0.94 03/17/2020   FREET4 0.91 02/12/2020   FREET4 0.49 (L) 01/22/2020   FREET4 0.55 (L) 11/26/2019   FREET4 1.29 10/22/2019   T3FREE 3.0 11/10/2020   T3FREE 3.1 09/29/2020   T3FREE 2.9 08/18/2020   T3FREE 3.2 06/16/2020   T3FREE 2.3 05/12/2020   T3FREE 3.4 03/17/2020   T3FREE 2.8 02/12/2020   T3FREE 2.4 01/22/2020   T3FREE 2.0 (L) 11/26/2019   T3FREE 4.1 10/22/2019   The Graves' antibodies were elevated, but improving: Lab Results  Component Value Date   TSI 284 (H) 11/10/2020   TSI 516 (H) 10/22/2019   TSI 7.24 (H) 10/01/2019  07/01/2014: TPO Abs 1 (<9)  Thyroid ultrasound (05/26/2014) checked due to history of enlarged thyroid was normal: -Normal gland size -No thyroid nodules -No lymphadenopathy  Pt denies: - feeling nodules in neck - hoarseness - dysphagia - choking - SOB with lying down  No FH of thyroid disease or thyroid cancer. No h/o radiation tx to head or neck.  No Biotin use. No recent steroids use.   She has IBS with diarrhea.  She also has a  history of osteoporosis. On denosumab.  This is managed by PCP.  ROS: + see HPI  I reviewed pt's medications, allergies, PMH, social hx, family hx, and changes were documented in the history of present illness. Otherwise, unchanged from my initial visit note.  Past Medical History:  Diagnosis Date   Arthritis    Chronic diarrhea    Chronic diastolic CHF (congestive heart failure) (HCC)    Hemorrhoid    Hyperthyroidism    Hypertrophic cardiomegaly    a. history of hypertrophic cardiomyopathy (mild septal focal hypertrophy) by echo in 2015 but this has not been  seen on more recent echoes.   IBS (irritable bowel syndrome)    Insomnia    Miscarriage    Osteoporosis    Persistent atrial fibrillation (HCC)    Pulmonary hypertension (HCC)    Thyromegaly    Vitamin D deficiency    Past Surgical History:  Procedure Laterality Date   CATARACT EXTRACTION     DILATION AND CURETTAGE OF UTERUS     EAR CYST EXCISION  11/09/2011   Procedure: CYST REMOVAL;  Surgeon: Tami Ribas, MD;  Location: [facility];  Service: Orthopedics;  Laterality: Right;  right index excision cyst/foreign body and debridement DIP joint   GANGLION CYST EXCISION     rt wrist   GANGLION CYST EXCISION     TONSILLECTOMY     WISDOM TOOTH EXTRACTION     Social History   Socioeconomic History   Marital status: Married    Spouse name: Not on file   Number of children: 3   Years of education: Not on file   Highest education level: Not on file  Occupational History    Comment: retired  Tobacco Use   Smoking status: Former    Types: Cigarettes    Quit date: 11/06/1988    Years since quitting: 32.5   Smokeless tobacco: Never  Substance and Sexual Activity   Alcohol use: Yes    Comment: rare   Drug use: No   Sexual activity: Not Currently  Other Topics Concern   Not on file  Social History Narrative   Lives with husband.  Has 2 biological children & 1 adopted child.  Takes care of disables son.  Still works and does all the housework   Social Determinants of Corporate investment banker Strain: Not on file  Food Insecurity: Not on file  Transportation Needs: Not on file  Physical Activity: Not on file  Stress: Not on file  Social Connections: Not on file  Intimate Partner Violence: Not on file   Current Outpatient Medications on File Prior to Visit  Medication Sig Dispense Refill   atenolol (TENORMIN) 50 MG tablet Take 1 tablet (50 mg total) by mouth daily. 90 tablet 3   Calcium Carb-Cholecalciferol [phone] MG-UNIT TABS Take 2 tablets by mouth daily.       denosumab (PROLIA) 60 MG/ML SOSY injection Inject 60 mg into the skin every 6 (six) months.     diphenoxylate-atropine (LOMOTIL) 2.5-0.025 MG per tablet Take 1 tablet by mouth as needed for diarrhea or loose stools. 30 tablet 4   furosemide (LASIX) 20 MG tablet Take 1 tablet (20 mg total) by mouth daily as needed (for fluid retention, or weight gain of 3-5lb). 90 tablet 3   methimazole (TAPAZOLE) 5 MG tablet Take 0.5 tablets (2.5 mg total) by mouth daily. (Patient taking differently: Take 2.5 mg by mouth daily. Takes Every Other Day) 45 tablet 5  potassium chloride (KLOR-CON) 10 MEQ tablet TAKE 1 TABLET BY MOUTH DAILY AS NEEDED (TAKE 1 TABLET WHEN TAKING A DOSE OF LASIX/FUROSEMIDE.). 90 tablet 3   temazepam (RESTORIL) 15 MG capsule Take 15 mg by mouth at bedtime as needed for sleep.     Vitamin D, Cholecalciferol, 25 MCG (1000 UT) CAPS Take 1,000 Units by mouth daily.     No current facility-administered medications on file prior to visit.   Allergies  Allergen Reactions   Amoxicillin Rash   Family History  Problem Relation Age of Onset   Vascular Disease Mother    Diabetes Father    Dementia Father    Lymphoma Sister    PE: BP 122/80 (BP Location: Right Arm, Patient Position: Sitting, Cuff Size: Normal)   Pulse (!) 54   Ht 5\' 4"  (1.626 m)   Wt 126 lb 12.8 oz (57.5 kg)   SpO2 94%   BMI 21.77 kg/m  Wt Readings from Last 3 Encounters:  05/11/21 126 lb 12.8 oz (57.5 kg)  11/10/20 126 lb 6.4 oz (57.3 kg)  10/06/20 129 lb 12.8 oz (58.9 kg)   Constitutional: normal weight, in NAD Eyes: PERRLA, EOMI, no exophthalmos ENT: moist mucous membranes, no thyromegaly, no cervical lymphadenopathy Cardiovascular: Irregularly irregular rhythm, RR, No MRG Respiratory: CTA B Musculoskeletal: no deformities, strength intact in all 4 Skin: moist, warm, no rashes Neurological: no tremor with outstretched hands, DTR normal in all 4  ASSESSMENT: 1.  Graves' disease  2.  Exertional chest  pain  PLAN:  1. Patient with history of thyrotoxicosis (low TSH, high free thyroid hormones) found during an admission for A. fib with RVR.  She also had thyrotoxic symptoms: Mild weight loss, palpitations, shortness of breath, fatigue, insomnia, diarrhea-however, this was longstanding, in the setting of IBS.  TSI antibodies were elevated, pointing towards a diagnosis of Graves' disease.  She was started on methimazole and atenolol in 09/2019, after which she started to feel better right away.  Her palpitations resolved, as did her shortness of breath.  Afterwards, we were able to decrease the methimazole further, to 2.5 mg daily.  In 03/2020, we increased the dose to 5 mg daily, however, 2 months later, in 05/2020, we decreased it back to 2.5 mg daily and tests remain controlled afterwards.  At last visit, in 11/2020, TFTs were still normal so we continued the same dose of methimazole. -She is taking 2.5 mg methimazole daily and she tolerates this well, without any side effects. -We did discuss in the past about definitive treatment for Graves' disease to include RAI treatment ablation or surgery, however, she responded well to methimazole so we are continuing this for now. -At the end of 2021, she had a weight gain of 10 pounds and low pulse in the 60s so we discussed about contacting cardiology to see if we could decrease her atenolol dose.  Her dose was subsequently decreased to 50 mg daily and her weight normalized.  At last visit, she still had some fatigue and we discussed about possibly reducing the dose of atenolol even further, to 25 mg daily.  She tried this and developed palpitations so she is currently back on the 50 mg daily.  Weight is stable.  -At this visit she has no neck compression symptoms.  She had a thyroid ultrasound in 2016 and there were no new thyroid nodules at that time. -She has no thyromegaly or thyroid nodules palpating on today's neck exam -No signs of Graves' ophthalmopathy  including  blurry vision, double vision, eye pain, chemosis -At today's visit we will check her TFTs and adjust the methimazole dose accordingly.  We discussed that we may need to decrease the dose to 2.5 mg every other day before stopping it again to avoid Graves' recurrence. -I will see her back in 6 months but possibly sooner for labs  2.  Exertional chest pain -At this visit, patient has an irregularly irregular rhythm on auscultation.  No palpitations after she increase the metoprolol back to 50 mg daily.  She has bradycardia today, at 54 bpm.  She was previously on blood thinners, but not anymore. -She tells me that she has some shortness of breath but also chest pain with exertion, for example walking or vacuuming.  This is bothersome enough that she has to stop her activity.  She tells me that she does not have an appointment scheduled with cardiology.  She last saw Dr. Antoine Poche in 10/2020.  I advised her to call his office and let him know about the chest pain.  She may need to have another office visit and possibly further investigation.  She agrees to do so.  Needs MMI refill.  Component     Latest Ref Rng & Units 05/11/2021  TSH     0.35 - 5.50 uIU/mL 2.48  T4,Free(Direct)     0.60 - 1.60 ng/dL 3.01  Triiodothyronine,Free,Serum     2.3 - 4.2 pg/mL 3.0  Thyroid tests are normal.  For now, I would suggest to continue the same dose of methimazole and we will recheck her TFTs at next visit.  Carlus Pavlov, MD PhD [facility] Endocrinology

## 2021-05-11 NOTE — Patient Instructions (Signed)
Please continue methimazole 2.5 mg daily.  Please stop at the lab.  Please come back for a follow-up appointment in 6 months. 

## 2021-06-15 DIAGNOSIS — G4700 Insomnia, unspecified: Secondary | ICD-10-CM | POA: Diagnosis not present

## 2021-06-15 DIAGNOSIS — I480 Paroxysmal atrial fibrillation: Secondary | ICD-10-CM | POA: Diagnosis not present

## 2021-06-15 DIAGNOSIS — M810 Age-related osteoporosis without current pathological fracture: Secondary | ICD-10-CM | POA: Diagnosis not present

## 2021-06-15 DIAGNOSIS — K589 Irritable bowel syndrome without diarrhea: Secondary | ICD-10-CM | POA: Diagnosis not present

## 2021-06-15 DIAGNOSIS — I700 Atherosclerosis of aorta: Secondary | ICD-10-CM | POA: Diagnosis not present

## 2021-06-15 DIAGNOSIS — Z0000 Encounter for general adult medical examination without abnormal findings: Secondary | ICD-10-CM | POA: Diagnosis not present

## 2021-06-25 DIAGNOSIS — M810 Age-related osteoporosis without current pathological fracture: Secondary | ICD-10-CM | POA: Diagnosis not present

## 2021-06-25 DIAGNOSIS — I480 Paroxysmal atrial fibrillation: Secondary | ICD-10-CM | POA: Diagnosis not present

## 2021-06-25 DIAGNOSIS — G4700 Insomnia, unspecified: Secondary | ICD-10-CM | POA: Diagnosis not present

## 2021-06-30 DIAGNOSIS — H6123 Impacted cerumen, bilateral: Secondary | ICD-10-CM | POA: Diagnosis not present

## 2021-06-30 DIAGNOSIS — H6980 Other specified disorders of Eustachian tube, unspecified ear: Secondary | ICD-10-CM | POA: Diagnosis not present

## 2021-10-05 DIAGNOSIS — H6123 Impacted cerumen, bilateral: Secondary | ICD-10-CM | POA: Diagnosis not present

## 2021-10-11 DIAGNOSIS — M25561 Pain in right knee: Secondary | ICD-10-CM | POA: Diagnosis not present

## 2021-10-14 DIAGNOSIS — Z79899 Other long term (current) drug therapy: Secondary | ICD-10-CM | POA: Diagnosis not present

## 2021-10-20 DIAGNOSIS — M810 Age-related osteoporosis without current pathological fracture: Secondary | ICD-10-CM | POA: Diagnosis not present

## 2021-10-27 DIAGNOSIS — M25561 Pain in right knee: Secondary | ICD-10-CM | POA: Diagnosis not present

## 2021-10-29 IMAGING — DX DG CHEST 1V PORT
1 series · 1 of 1 positions shown · non-contrast
Comparison: August 25, 2019.

CLINICAL DATA: Dyspnea on exertion.

EXAM:
PORTABLE CHEST 1 VIEW

[chest ap]
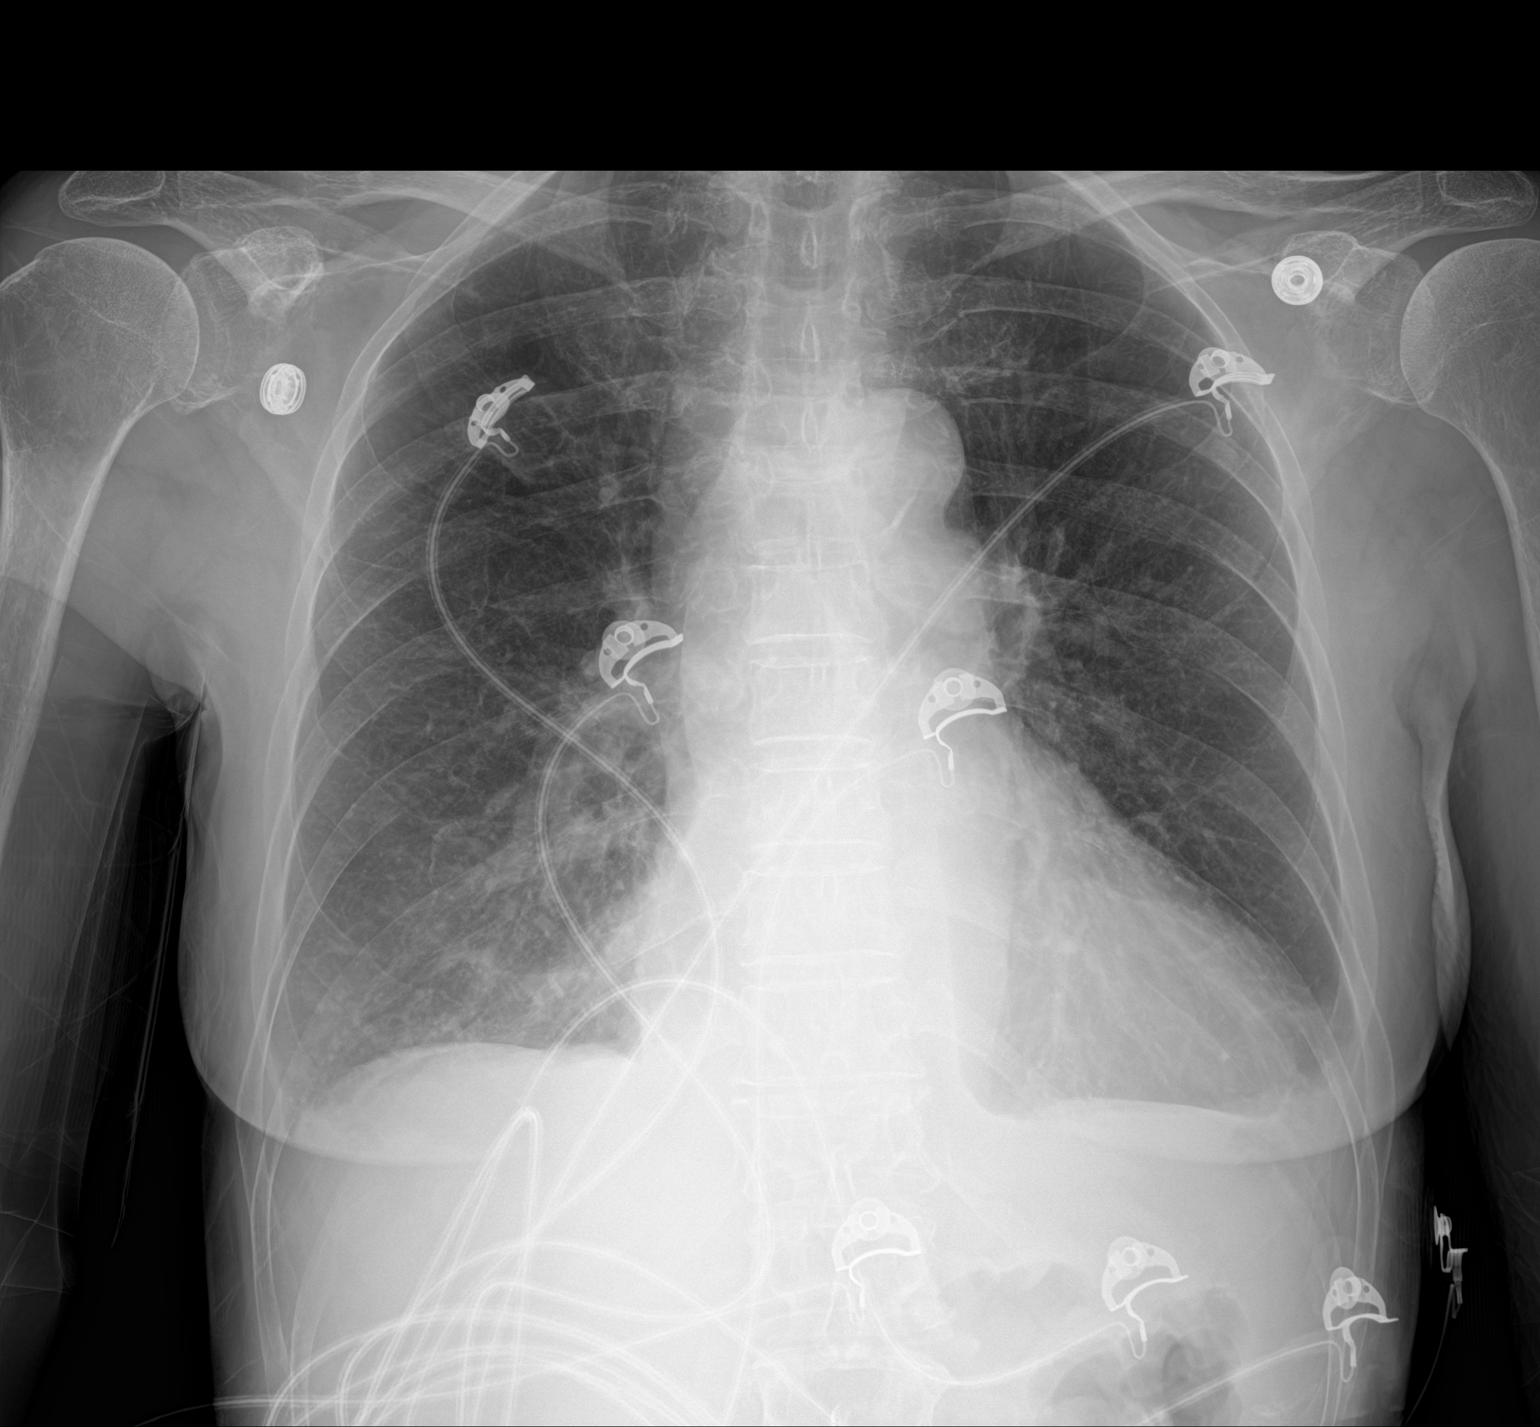

[1 of 1 positions shown; findings below may reference images not displayed]

FINDINGS: Stable cardiomediastinal silhouette. No pneumothorax is noted.
Minimal bibasilar subsegmental atelectasis is noted. Small left
pleural effusion is noted. Bony thorax is unremarkable.
IMPRESSION: Minimal bibasilar subsegmental atelectasis. Small left pleural
effusion.

## 2021-11-10 ENCOUNTER — Encounter: Payer: Self-pay | Admitting: Internal Medicine

## 2021-11-10 ENCOUNTER — Ambulatory Visit: Payer: Medicare HMO | Admitting: Internal Medicine

## 2021-11-10 VITALS — BP 128/84 | HR 51 | Ht 64.0 in | Wt 127.6 lb

## 2021-11-10 DIAGNOSIS — E0500 Thyrotoxicosis with diffuse goiter without thyrotoxic crisis or storm: Secondary | ICD-10-CM

## 2021-11-10 LAB — T4, FREE: Free T4: 0.91 ng/dL (ref 0.60–1.60)

## 2021-11-10 LAB — T3, FREE: T3, Free: 3.1 pg/mL (ref 2.3–4.2)

## 2021-11-10 LAB — TSH: TSH: 3.74 u[IU]/mL (ref 0.35–5.50)

## 2021-11-10 MED ORDER — METHIMAZOLE 5 MG PO TABS: 2.5000 mg | ORAL_TABLET | ORAL | 3 refills | Status: DC

## 2021-11-10 NOTE — Progress Notes (Signed)
Patient ID: [name], female   DOB: 1940-07-01, 81 y.o.   MRN: [id number]   HPI  Lynn Hayden is a 81 y.o.-year-old female, initially referred by her cardiologist, Dr. Antoine Poche, returning for follow-up for thyrotoxicosis.  Last visit 6 months ago.  Interim history: She continues to have fatigue, but improved.   She also continues to have diarrhea-on Lomotil also chronic. No palpitations, anxiety, tremors.  No hot flashes. She has chronic insomnia - on Temazepan for >10 years.  Reviewed history: Patient has a history of A. fib and was admitted with RVR twice in the last 2 months: 08/25/2018 and 09/30/2019.  During her admission from 09/2019, she was found to be thyrotoxic. Retrospectively, she had SOB, palpitations for at least 6 months.   On 10/07/2019, She was started on: - Methimazole 10 mg 2x a day - Atenolol 50 mg 2x a day  10/22/2019: Decreased methimazole to 5 mg twice daily  11/26/2019: Decreased methimazole to 5 mg daily  01/12/2020: Decreased methimazole to 2.5 mg daily  03/17/2020: Increased methimazole to 5 mg daily  She felt much better after starting methimazole, without shortness of breath.  02/12/2020: She felt more fatigued so I advised her to contact cardiology to see if she could decrease her atenolol dose>> dose was decreased atenolol 50 mg 1x a day in am.  05/12/2020: Decreased methimazole to 2.5 mg daily  11/2020: Advised her to decrease atenolol to 25 mg daily, but she developed palpitations and increased it back to 50 mg daily.  Reviewed her TFTs: Lab Results  Component Value Date   TSH 2.48 05/11/2021   TSH 0.60 11/10/2020   TSH 0.47 09/29/2020   TSH 0.46 08/18/2020   TSH 0.94 06/16/2020   TSH 6.28 (H) 05/12/2020   TSH 0.03 (L) 03/17/2020   TSH 0.35 02/12/2020   TSH 7.93 (H) 01/22/2020   TSH <0.01 (L) 11/26/2019   FREET4 0.90 05/11/2021   FREET4 0.92 11/10/2020   FREET4 0.97 09/29/2020   FREET4 0.84 08/18/2020   FREET4 1.07 06/16/2020   FREET4  0.67 05/12/2020   FREET4 0.94 03/17/2020   FREET4 0.91 02/12/2020   FREET4 0.49 (L) 01/22/2020   FREET4 0.55 (L) 11/26/2019   T3FREE 3.0 05/11/2021   T3FREE 3.0 11/10/2020   T3FREE 3.1 09/29/2020   T3FREE 2.9 08/18/2020   T3FREE 3.2 06/16/2020   T3FREE 2.3 05/12/2020   T3FREE 3.4 03/17/2020   T3FREE 2.8 02/12/2020   T3FREE 2.4 01/22/2020   T3FREE 2.0 (L) 11/26/2019   The Graves' antibodies were elevated, but improving: Lab Results  Component Value Date   TSI 284 (H) 11/10/2020   TSI 516 (H) 10/22/2019   TSI 7.24 (H) 10/01/2019  07/01/2014: TPO Abs 1 (<9)  Thyroid ultrasound (05/26/2014) checked due to history of enlarged thyroid was normal: -Normal gland size -No thyroid nodules -No lymphadenopathy  Pt denies: - feeling nodules in neck - hoarseness - dysphagia - choking  No FH of thyroid disease or thyroid cancer. No h/o radiation tx to head or neck. No Biotin use. No recent steroids use.   She has IBS with diarrhea. She also has a history of osteoporosis. On denosumab.  This is managed by PCP.  ROS: + see HPI  I reviewed pt's medications, allergies, PMH, social hx, family hx, and changes were documented in the history of present illness. Otherwise, unchanged from my initial visit note.  Past Medical History:  Diagnosis Date   Arthritis    Chronic diarrhea  Chronic diastolic CHF (congestive heart failure) (HCC)    Hemorrhoid    Hyperthyroidism    Hypertrophic cardiomegaly    a. history of hypertrophic cardiomyopathy (mild septal focal hypertrophy) by echo in 2015 but this has not been seen on more recent echoes.   IBS (irritable bowel syndrome)    Insomnia    Miscarriage    Osteoporosis    Persistent atrial fibrillation (HCC)    Pulmonary hypertension (HCC)    Thyromegaly    Vitamin D deficiency    Past Surgical History:  Procedure Laterality Date   CATARACT EXTRACTION     DILATION AND CURETTAGE OF UTERUS     EAR CYST EXCISION  11/09/2011    Procedure: CYST REMOVAL;  Surgeon: Tami Ribas, MD;  Location: [facility];  Service: Orthopedics;  Laterality: Right;  right index excision cyst/foreign body and debridement DIP joint   GANGLION CYST EXCISION     rt wrist   GANGLION CYST EXCISION     TONSILLECTOMY     WISDOM TOOTH EXTRACTION     Social History   Socioeconomic History   Marital status: Married    Spouse name: Not on file   Number of children: 3   Years of education: Not on file   Highest education level: Not on file  Occupational History    Comment: retired  Tobacco Use   Smoking status: Former    Types: Cigarettes    Quit date: 11/06/1988    Years since quitting: 33.0   Smokeless tobacco: Never  Substance and Sexual Activity   Alcohol use: Yes    Comment: rare   Drug use: No   Sexual activity: Not Currently  Other Topics Concern   Not on file  Social History Narrative   Lives with husband.  Has 2 biological children & 1 adopted child.  Takes care of disables son.  Still works and does all the housework   Social Determinants of Corporate investment banker Strain: Not on file  Food Insecurity: Not on file  Transportation Needs: Not on file  Physical Activity: Not on file  Stress: Not on file  Social Connections: Not on file  Intimate Partner Violence: Not on file   Current Outpatient Medications on File Prior to Visit  Medication Sig Dispense Refill   atenolol (TENORMIN) 50 MG tablet Take 1 tablet (50 mg total) by mouth daily. 90 tablet 3   Calcium Carb-Cholecalciferol [phone] MG-UNIT TABS Take 2 tablets by mouth daily.      denosumab (PROLIA) 60 MG/ML SOSY injection Inject 60 mg into the skin every 6 (six) months.     diphenoxylate-atropine (LOMOTIL) 2.5-0.025 MG per tablet Take 1 tablet by mouth as needed for diarrhea or loose stools. 30 tablet 4   furosemide (LASIX) 20 MG tablet Take 1 tablet (20 mg total) by mouth daily as needed (for fluid retention, or weight gain of 3-5lb). 90  tablet 3   methimazole (TAPAZOLE) 5 MG tablet Take 0.5 tablets (2.5 mg total) by mouth daily. 45 tablet 3   potassium chloride (KLOR-CON) 10 MEQ tablet TAKE 1 TABLET BY MOUTH DAILY AS NEEDED (TAKE 1 TABLET WHEN TAKING A DOSE OF LASIX/FUROSEMIDE.). 90 tablet 3   temazepam (RESTORIL) 15 MG capsule Take 15 mg by mouth at bedtime as needed for sleep.     Vitamin D, Cholecalciferol, 25 MCG (1000 UT) CAPS Take 1,000 Units by mouth daily.     No current facility-administered medications on file prior to  visit.   Allergies  Allergen Reactions   Amoxicillin Rash   Family History  Problem Relation Age of Onset   Vascular Disease Mother    Diabetes Father    Dementia Father    Lymphoma Sister    PE: BP 128/84 (BP Location: Right Arm, Patient Position: Sitting, Cuff Size: Normal)   Pulse (!) 51   Ht 5\' 4"  (1.626 m)   Wt 127 lb 9.6 oz (57.9 kg)   SpO2 99%   BMI 21.90 kg/m  Wt Readings from Last 3 Encounters:  11/10/21 127 lb 9.6 oz (57.9 kg)  05/11/21 126 lb 12.8 oz (57.5 kg)  11/10/20 126 lb 6.4 oz (57.3 kg)   Constitutional: normal weight, in NAD Eyes: EOMI, no exophthalmos ENT: moist mucous membranes, no thyromegaly, no cervical lymphadenopathy Cardiovascular: RRR, No MRG Respiratory: CTA B Musculoskeletal: no deformities Skin: moist, warm, no rashes Neurological: no tremor with outstretched hands, DTR normal in all 4  ASSESSMENT: 1.  Graves' disease  PLAN:  1. Patient with history of thyrotoxicosis (low TSH, high free thyroid hormones) found during an admission for A-fib with RVR.  She had thyrotoxic symptoms: Mild weight loss, palpitations, shortness of breath, fatigue, insomnia, diarrhea, however, this was longstanding, in the setting of IBS.  TSI antibodies were elevated, pointing towards a diagnosis of Graves' disease.  She was started on methimazole and atenolol in 09/2019, after which she started to feel better right away.  Palpitations resolved, as did her shortness of  breath.  Afterwards, we were able to decrease the methimazole dose to 2.5 mg daily.  In 03/2020, we had to increase the dose to 5 mg daily, and then decrease it back to 2.5 mg daily in 05/2020.  We continued the same dose of methimazole afterwards.  She has no side effects from methimazole.  She is also on atenolol 50 mg daily.  She tried to decrease the dose to 25 mg daily (due to weight gain) but she developed palpitations and went back on the dose. -Reviewed together her latest TFTs, which were normal in 05/2021. -Today's visit, she does not have thyrotoxic signs or symptoms: No tremors, weight loss, heat intolerance, anxiety. -She has no neck compression symptoms.  She had a thyroid ultrasound in 2015 and there were no nodules.  Also, no masses felt on today's neck exam. -No signs of active Graves' ophthalmopathy: Blurry vision, double vision, eye pain, chemosis -At today's visit, we will recheck her TFTs.  We discussed that we may need to decrease the dose of methimazole to 2.5 mg every other day before stopping it again, to avoid Graves' disease recurrence. -I will see her back in 1 year, but in 6 months or sooner for labs  Component     Latest Ref Rng 11/10/2021  TSH     0.35 - 5.50 uIU/mL 3.74   T4,Free(Direct)     0.60 - 1.60 ng/dL 1.610.91   Triiodothyronine,Free,Serum     2.3 - 4.2 pg/mL 3.1   TSH is higher >> we can try to decrease the MMI dose to 2.5 mg every other day and repeat the TFTs in 1.5 mo.  Lynn Pavlovristina Alegandro Macnaughton, MD PhD [facility]

## 2021-11-10 NOTE — Patient Instructions (Addendum)
Please continue methimazole 2.5 mg daily.  Please stop at the lab.  Please come back for a follow-up appointment in 1 year, but for labs in 05/2022.

## 2021-11-23 DIAGNOSIS — N390 Urinary tract infection, site not specified: Secondary | ICD-10-CM | POA: Diagnosis not present

## 2021-12-23 ENCOUNTER — Other Ambulatory Visit: Payer: Medicare HMO

## 2021-12-26 ENCOUNTER — Other Ambulatory Visit (INDEPENDENT_AMBULATORY_CARE_PROVIDER_SITE_OTHER): Payer: Medicare HMO

## 2021-12-26 DIAGNOSIS — E0500 Thyrotoxicosis with diffuse goiter without thyrotoxic crisis or storm: Secondary | ICD-10-CM

## 2021-12-26 LAB — TSH: TSH: 2.98 u[IU]/mL (ref 0.35–5.50)

## 2021-12-26 LAB — T4, FREE: Free T4: 0.83 ng/dL (ref 0.60–1.60)

## 2021-12-26 LAB — T3, FREE: T3, Free: 2.9 pg/mL (ref 2.3–4.2)

## 2022-01-11 DIAGNOSIS — T85698A Other mechanical complication of other specified internal prosthetic devices, implants and grafts, initial encounter: Secondary | ICD-10-CM | POA: Diagnosis not present

## 2022-01-11 DIAGNOSIS — H6123 Impacted cerumen, bilateral: Secondary | ICD-10-CM | POA: Diagnosis not present

## 2022-01-20 DIAGNOSIS — H26491 Other secondary cataract, right eye: Secondary | ICD-10-CM | POA: Diagnosis not present

## 2022-01-20 DIAGNOSIS — I480 Paroxysmal atrial fibrillation: Secondary | ICD-10-CM | POA: Diagnosis not present

## 2022-01-20 DIAGNOSIS — H524 Presbyopia: Secondary | ICD-10-CM | POA: Diagnosis not present

## 2022-01-20 DIAGNOSIS — G4700 Insomnia, unspecified: Secondary | ICD-10-CM | POA: Diagnosis not present

## 2022-01-20 DIAGNOSIS — M810 Age-related osteoporosis without current pathological fracture: Secondary | ICD-10-CM | POA: Diagnosis not present

## 2022-01-30 DIAGNOSIS — H26491 Other secondary cataract, right eye: Secondary | ICD-10-CM | POA: Diagnosis not present

## 2022-03-03 DIAGNOSIS — Z23 Encounter for immunization: Secondary | ICD-10-CM | POA: Diagnosis not present

## 2022-04-14 DIAGNOSIS — J019 Acute sinusitis, unspecified: Secondary | ICD-10-CM | POA: Diagnosis not present

## 2022-04-17 DIAGNOSIS — H6123 Impacted cerumen, bilateral: Secondary | ICD-10-CM | POA: Diagnosis not present

## 2022-05-09 ENCOUNTER — Other Ambulatory Visit: Payer: Self-pay | Admitting: Internal Medicine

## 2022-05-12 ENCOUNTER — Other Ambulatory Visit (INDEPENDENT_AMBULATORY_CARE_PROVIDER_SITE_OTHER): Payer: Medicare HMO

## 2022-05-12 ENCOUNTER — Other Ambulatory Visit: Payer: Self-pay | Admitting: Endocrinology

## 2022-05-12 ENCOUNTER — Other Ambulatory Visit: Payer: Medicare HMO

## 2022-05-12 DIAGNOSIS — E0500 Thyrotoxicosis with diffuse goiter without thyrotoxic crisis or storm: Secondary | ICD-10-CM | POA: Diagnosis not present

## 2022-05-12 LAB — TSH: TSH: 1.65 u[IU]/mL (ref 0.35–5.50)

## 2022-05-12 LAB — T4, FREE: Free T4: 0.75 ng/dL (ref 0.60–1.60)

## 2022-05-13 LAB — THYROTROPIN RECEPTOR AUTOABS: Thyrotropin Receptor Ab: <1.1 IU/L (ref 0.00–1.75)

## 2022-06-22 DIAGNOSIS — I700 Atherosclerosis of aorta: Secondary | ICD-10-CM | POA: Diagnosis not present

## 2022-06-22 DIAGNOSIS — Z1211 Encounter for screening for malignant neoplasm of colon: Secondary | ICD-10-CM | POA: Diagnosis not present

## 2022-06-22 DIAGNOSIS — E0500 Thyrotoxicosis with diffuse goiter without thyrotoxic crisis or storm: Secondary | ICD-10-CM | POA: Diagnosis not present

## 2022-06-22 DIAGNOSIS — Z79899 Other long term (current) drug therapy: Secondary | ICD-10-CM | POA: Diagnosis not present

## 2022-06-22 DIAGNOSIS — M810 Age-related osteoporosis without current pathological fracture: Secondary | ICD-10-CM | POA: Diagnosis not present

## 2022-06-22 DIAGNOSIS — Z136 Encounter for screening for cardiovascular disorders: Secondary | ICD-10-CM | POA: Diagnosis not present

## 2022-06-22 DIAGNOSIS — Z0000 Encounter for general adult medical examination without abnormal findings: Secondary | ICD-10-CM | POA: Diagnosis not present

## 2022-06-22 DIAGNOSIS — M179 Osteoarthritis of knee, unspecified: Secondary | ICD-10-CM | POA: Diagnosis not present

## 2022-06-22 DIAGNOSIS — Z13220 Encounter for screening for lipoid disorders: Secondary | ICD-10-CM | POA: Diagnosis not present

## 2022-06-22 DIAGNOSIS — G4700 Insomnia, unspecified: Secondary | ICD-10-CM | POA: Diagnosis not present

## 2022-06-26 DIAGNOSIS — Z1211 Encounter for screening for malignant neoplasm of colon: Secondary | ICD-10-CM | POA: Diagnosis not present

## 2022-06-30 ENCOUNTER — Encounter: Payer: Self-pay | Admitting: Internal Medicine

## 2022-06-30 DIAGNOSIS — M810 Age-related osteoporosis without current pathological fracture: Secondary | ICD-10-CM | POA: Diagnosis not present

## 2022-07-11 DIAGNOSIS — M810 Age-related osteoporosis without current pathological fracture: Secondary | ICD-10-CM | POA: Diagnosis not present

## 2022-07-24 DIAGNOSIS — H6123 Impacted cerumen, bilateral: Secondary | ICD-10-CM | POA: Diagnosis not present

## 2022-08-14 DIAGNOSIS — Z0100 Encounter for examination of eyes and vision without abnormal findings: Secondary | ICD-10-CM | POA: Diagnosis not present

## 2022-08-14 DIAGNOSIS — H04123 Dry eye syndrome of bilateral lacrimal glands: Secondary | ICD-10-CM | POA: Diagnosis not present

## 2022-08-14 DIAGNOSIS — H26491 Other secondary cataract, right eye: Secondary | ICD-10-CM | POA: Diagnosis not present

## 2022-08-14 DIAGNOSIS — H5212 Myopia, left eye: Secondary | ICD-10-CM | POA: Diagnosis not present

## 2022-08-14 DIAGNOSIS — Z961 Presence of intraocular lens: Secondary | ICD-10-CM | POA: Diagnosis not present

## 2022-10-16 DIAGNOSIS — H6123 Impacted cerumen, bilateral: Secondary | ICD-10-CM | POA: Diagnosis not present

## 2022-10-18 DIAGNOSIS — E0500 Thyrotoxicosis with diffuse goiter without thyrotoxic crisis or storm: Secondary | ICD-10-CM | POA: Diagnosis not present

## 2022-10-23 ENCOUNTER — Encounter: Payer: Self-pay | Admitting: Internal Medicine

## 2022-11-15 DIAGNOSIS — M25561 Pain in right knee: Secondary | ICD-10-CM | POA: Diagnosis not present

## 2022-12-29 DIAGNOSIS — H6123 Impacted cerumen, bilateral: Secondary | ICD-10-CM | POA: Diagnosis not present

## 2023-01-11 DIAGNOSIS — M810 Age-related osteoporosis without current pathological fracture: Secondary | ICD-10-CM | POA: Diagnosis not present

## 2023-02-01 ENCOUNTER — Institutional Professional Consult (permissible substitution) (INDEPENDENT_AMBULATORY_CARE_PROVIDER_SITE_OTHER): Payer: Medicare HMO | Admitting: Otolaryngology

## 2023-02-02 ENCOUNTER — Institutional Professional Consult (permissible substitution) (INDEPENDENT_AMBULATORY_CARE_PROVIDER_SITE_OTHER): Payer: Medicare HMO | Admitting: Otolaryngology

## 2023-02-12 ENCOUNTER — Ambulatory Visit (INDEPENDENT_AMBULATORY_CARE_PROVIDER_SITE_OTHER): Payer: Medicare HMO | Admitting: Otolaryngology

## 2023-02-12 ENCOUNTER — Encounter (INDEPENDENT_AMBULATORY_CARE_PROVIDER_SITE_OTHER): Payer: Self-pay | Admitting: Otolaryngology

## 2023-02-12 VITALS — BP 144/65 | HR 56 | Ht 64.0 in | Wt 124.0 lb

## 2023-02-12 DIAGNOSIS — H9193 Unspecified hearing loss, bilateral: Secondary | ICD-10-CM

## 2023-02-12 DIAGNOSIS — H6123 Impacted cerumen, bilateral: Secondary | ICD-10-CM | POA: Diagnosis not present

## 2023-02-12 DIAGNOSIS — H9313 Tinnitus, bilateral: Secondary | ICD-10-CM | POA: Diagnosis not present

## 2023-02-12 DIAGNOSIS — H6993 Unspecified Eustachian tube disorder, bilateral: Secondary | ICD-10-CM | POA: Diagnosis not present

## 2023-02-12 NOTE — Progress Notes (Signed)
ENT CONSULT:  Reason for Consult: muffled hearing tinnitus and recurrent cerumen impaction    HPI: Lynn Hayden is an 82 y.o. 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 Had Audio done 5 yrs ago and then ear tubes that have fallen out since.  She goes to [facility] for ear cleaning every 4 months and is frustrated that her ears get clogged so fast. She has had tinnitus for a long time.  Tinnitus is bilateral high-pitch steady sound and has had it for 30 yrs. Her sister had tinnitus since she was very young. She is not sure what her last hearing test showed Hx of Graves Dz - hx of admission 2/2 thyrotoxicosis on Atenolol and Methimazole.   Records Reviewed:  Note from PCP Lynn Hayden - telehealth  visit  Lynn Hayden - patient requested referral to [place] for hearing eval/hearing aid consultation and cerumen issue  thyroid lab results from 10/21/2022 and they are excellent Lynn Hayden - 10/23/22    Past Medical History:  Diagnosis Date   Arthritis    Chronic diarrhea    Chronic diastolic CHF (congestive heart failure) (HCC)    Hemorrhoid    Hyperthyroidism    Hypertrophic cardiomegaly    a. history of hypertrophic cardiomyopathy (mild septal focal hypertrophy) by echo in 2015 but this has not been seen on more recent echoes.   IBS (irritable bowel syndrome)    Insomnia    Miscarriage    Osteoporosis    Persistent atrial fibrillation (HCC)    Pulmonary hypertension (HCC)    Thyromegaly    Vitamin D deficiency     Past Surgical History:  Procedure Laterality Date   CATARACT EXTRACTION     DILATION AND CURETTAGE OF UTERUS     EAR CYST EXCISION  11/09/2011   Procedure: CYST REMOVAL;  Surgeon: Lynn Ribas, MD;  Location: [facility];  Service: Orthopedics;  Laterality: Right;  right index  excision cyst/foreign body and debridement DIP joint   GANGLION CYST EXCISION     rt wrist   GANGLION CYST EXCISION     TONSILLECTOMY     WISDOM TOOTH EXTRACTION      Family History  Problem Relation Age of Onset   Vascular Disease Mother    Diabetes Father    Dementia Father    Lymphoma Sister     Social History:  reports that she quit smoking about 34 years ago. Her smoking use included cigarettes. She has never used smokeless tobacco. She reports current alcohol use. She reports that she does not use drugs.  Allergies:  Allergies  Allergen Reactions   Amoxicillin Rash    Medications: I have reviewed the patient's current medications.  The PMH, PSH, Medications, Allergies, and SH were reviewed and updated.  ROS: Constitutional: Negative for fever, weight loss and weight gain. Cardiovascular: Negative for chest pain and dyspnea on exertion. Respiratory: Is not experiencing shortness of breath at rest. Gastrointestinal: Negative for nausea and vomiting. Neurological: Negative for headaches. Psychiatric: The patient is not nervous/anxious  Blood pressure (!) 144/65, pulse (!) 56, height 5\' 4"  (1.626 m), weight 124 lb (56.2 kg), SpO2 96%.  PHYSICAL EXAM:  Exam: General: Well-developed, well-nourished Respiratory Respiratory effort: Equal inspiration and expiration without stridor Cardiovascular Peripheral Vascular: Warm extremities with equal color/perfusion  Eyes: No nystagmus with equal extraocular motion bilaterally Neuro/Psych/Balance: Patient oriented to person, place, and time; Appropriate mood and affect; Gait is intact with no imbalance; Cranial nerves I-XII are intact Head and Face Inspection: Normocephalic and atraumatic without mass or lesion Palpation: Facial skeleton intact without bony stepoffs Salivary Glands: No mass or tenderness Facial Strength: Facial motility symmetric and full bilaterally ENT Pinna: External ear intact and fully  developed External canal: Canal is patent with intact skin Tympanic Membrane: Clear and mobile External Nose: No scar or anatomic deformity Internal Nose: Septum intact and midline. No edema, polyp, or rhinorrhea Lips, Teeth, and gums: Mucosa and teeth intact and viable TMJ: No pain to palpation with full mobility Oral cavity/oropharynx: No erythema or exudate, no lesions present Neck Neck and Trachea: Midline trachea without mass or lesion Thyroid: No mass or nodularity Lymphatics: No lymphadenopathy  Procedure: Procedure: Cerumen Removal, Bilateral (CPT [id number])  Diagnosis: cerumen impaction, bilateral   Informed consent: Timeout performed and informed consent was obtained.  Procedure: Operating microscope was employed to evaluate the ear(s).  Cerumen curette, speculum and suction were employed to clear the cerumen.   Findings: Normal appearing tympanic membranes without perforations, and external canals are normal after removal of cerumen.No middle ear fluid bilaterally.   Complications: None. Patient tolerated well.    Studies Reviewed:labs for Graves - negative thyrotropin receptor and normal TSH/T4  Assessment/Plan: Encounter Diagnoses  Name Primary?   Tinnitus of both ears Yes   Dysfunction of both eustachian tubes    Bilateral impacted cerumen    Decreased hearing of both ears     82 year old female with history of Graves' disease currently well-controlled on methimazole and atenolol, reported thyrotoxicosis requiring admission a couple years ago followed by endocrine, here for chronic tinnitus described as bilateral high-pitched sound nearly constant and recurrent cerumen impaction requiring ear cleanings every 3 to 4 months.  She also reports symptoms of muffled hearing and clogged.  Previously seen by [facility] physicians.  Denies ear pain.  Last audiogram 5 years ago after which she had bilateral ear tubes placed, that had fallen out since the procedure.  No other  ear surgeries.  Denies symptoms of nasal congestion or allergies.   On exam today following cerumen removal bilaterally, both TMs appear intact and there is no evidence of fluid in the middle ear.  The remainder of her exam was unremarkable, aside from palpable thyromegaly.  Records indicate that she had normal TFTs and regularly seeing endocrinology.   We discussed the need for hearing evaluation and will order audiogram, she will return after testing.   Thank you for allowing me to participate in the care of this patient. Please do not hesitate to contact me with any questions or concerns.   Ashok Croon, MD Otolaryngology [facility] Phone: [phone] Fax: [phone]    02/12/2023, 10:02 AM

## 2023-02-16 DIAGNOSIS — N390 Urinary tract infection, site not specified: Secondary | ICD-10-CM | POA: Diagnosis not present

## 2023-02-16 DIAGNOSIS — Z23 Encounter for immunization: Secondary | ICD-10-CM | POA: Diagnosis not present

## 2023-03-01 ENCOUNTER — Ambulatory Visit: Payer: Medicare HMO | Attending: Family Medicine | Admitting: Audiologist

## 2023-03-01 DIAGNOSIS — H903 Sensorineural hearing loss, bilateral: Secondary | ICD-10-CM | POA: Diagnosis not present

## 2023-03-01 DIAGNOSIS — H9313 Tinnitus, bilateral: Secondary | ICD-10-CM | POA: Insufficient documentation

## 2023-03-01 NOTE — Procedures (Signed)
Outpatient Audiology and [facility] [address] [place], [place]  [address] [phone]  AUDIOLOGICAL  EVALUATION  NAME: Lynn Hayden     DOB:   1940/08/26      MRN: [id number]                                                                                     DATE: 03/01/2023     REFERENT: [name], MD STATUS: Outpatient DIAGNOSIS: Sensorineural hearing loss bilaterally  History: Lynn Hayden was seen for an audiological evaluation due to difficulty hearing on and off when her ears are filled with wax.  She said some days her wax is bad others it is not.  Yesterday she was having difficulty hearing but today she feels she hears well.  She recently had wax removed with [facility].  She denies any pain in either ear today. Lynn Hayden has had a high-pitched ringing tinnitus for over 30 years.  She has not had any recent hearing tests. She has history of excessive noise exposure.  No other case history provided  Evaluation:  Otoscopy showed a clear view of the tympanic membranes, bilaterally.  No cerumen present in either canal Tympanometry results were consistent with shallow middle ear compliance bilaterally, consistent with possible middle ear dysfunction Audiometric testing was completed using Conventional Audiometry techniques with supra aural headphones. Test results are consistent with normal sloping to moderately severe sensorineural hearing loss bilaterally with a slight conductive component at 4 kHz in the right ear only. Speech Recognition Thresholds were obtained at 45 dB HL in the right ear and at 45dB HL in the left ear. Word Recognition Testing was completed at 85 dB HL and Lynn Hayden scored 100% in each ear with contralateral masking.   Results:  The test results were reviewed with Lynn Hayden.  She has a moderately severe high-frequency sensorineural hearing loss.  This is likely the reason for the ringing in both ears.  This is also why with congestion or wax impaction she feels  like her hearing is significantly impacted.  She is a good candidate for hearing aids.  The benefit of hearing aids were discussed at length and she was given a list of local providers and 2 copies of her audiogram.  She was also given a handout on [employer] to try and manage the cerumen buildup at home.   Recommendations: 1.   Recommend hearing aids for both ears due to high-frequency sensorineural hearing loss bilaterally   32 minutes spent testing and counseling on results.   If you have any questions please feel free to contact me at [phone].  Ammie Ferrier Audiologist, Au.D., CCC-A 03/01/2023  1:20 PM  Cc: [name], MD

## 2023-03-12 DIAGNOSIS — M170 Bilateral primary osteoarthritis of knee: Secondary | ICD-10-CM | POA: Diagnosis not present

## 2023-03-15 ENCOUNTER — Telehealth: Payer: Self-pay | Admitting: Internal Medicine

## 2023-03-15 NOTE — Telephone Encounter (Signed)
Left a detailed message,   J.Derelle Cockrell,RMA  

## 2023-03-15 NOTE — Telephone Encounter (Signed)
Ok to order TSH, Free T3 and Free T4?

## 2023-03-15 NOTE — Telephone Encounter (Signed)
J, I would prefer to check them at the time of her visit.  I see that this is coming up.

## 2023-03-15 NOTE — Telephone Encounter (Signed)
Patient is calling to set up follow up appointment with Dr. Elvera Lennox.  Patient is also requesting labs to be done on Thursday, 03/22/2023.

## 2023-03-22 ENCOUNTER — Encounter (INDEPENDENT_AMBULATORY_CARE_PROVIDER_SITE_OTHER): Payer: Self-pay | Admitting: Otolaryngology

## 2023-03-22 ENCOUNTER — Other Ambulatory Visit: Payer: Medicare HMO

## 2023-03-22 ENCOUNTER — Ambulatory Visit (INDEPENDENT_AMBULATORY_CARE_PROVIDER_SITE_OTHER): Payer: Medicare HMO | Admitting: Otolaryngology

## 2023-03-22 VITALS — BP 152/53 | HR 50 | Ht 64.0 in | Wt 124.8 lb

## 2023-03-22 DIAGNOSIS — J343 Hypertrophy of nasal turbinates: Secondary | ICD-10-CM

## 2023-03-22 DIAGNOSIS — H903 Sensorineural hearing loss, bilateral: Secondary | ICD-10-CM | POA: Diagnosis not present

## 2023-03-22 DIAGNOSIS — R0981 Nasal congestion: Secondary | ICD-10-CM

## 2023-03-22 DIAGNOSIS — J3089 Other allergic rhinitis: Secondary | ICD-10-CM

## 2023-03-22 DIAGNOSIS — H6993 Unspecified Eustachian tube disorder, bilateral: Secondary | ICD-10-CM

## 2023-03-22 DIAGNOSIS — J342 Deviated nasal septum: Secondary | ICD-10-CM | POA: Diagnosis not present

## 2023-03-22 DIAGNOSIS — H9313 Tinnitus, bilateral: Secondary | ICD-10-CM

## 2023-03-22 MED ORDER — MOMETASONE FUROATE 50 MCG/ACT NA SUSP: 2.0000 | Freq: Every day | NASAL | 12 refills | Status: DC

## 2023-03-22 MED ORDER — SALINE SPRAY 0.65 % NA SOLN: 1.0000 | NASAL | 5 refills | Status: DC | PRN

## 2023-03-22 MED ORDER — DESLORATADINE 5 MG PO TABS: 5.0000 mg | ORAL_TABLET | Freq: Every day | ORAL | 3 refills | Status: DC

## 2023-03-22 NOTE — Progress Notes (Signed)
ENT progress note:  Update 03/22/2023: She returns for follow-up after hearing test which showed bilateral sensorineural hearing loss which appears to be age-related and symmetric, middle ear function at the time of the test.  She continues to have sensation of clogged up ears mostly on the left side.   Initial evaluation  Reason for Consult: muffled hearing tinnitus and recurrent cerumen impaction    HPI: Lynn Hayden is an 82 y.o. 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 Had Audio done 5 yrs ago and then ear tubes that have fallen out since.  She goes to [facility] for ear cleaning every 4 months and is frustrated that her ears get clogged so fast. She has had tinnitus for a long time.  Tinnitus is bilateral high-pitch steady sound and has had it for 30 yrs. Her sister had tinnitus since she was very young. She is not sure what her last hearing test showed Hx of Graves Dz - hx of admission 2/2 thyrotoxicosis on Atenolol and Methimazole.   Records Reviewed:  Note from PCP Dr Lenise Arena - telehealth  visit  [name] - patient requested referral to [place] for hearing eval/hearing aid consultation and cerumen issue  thyroid lab results from 10/21/2022 and they are excellent [name] - 10/23/22    Past Medical History:  Diagnosis Date   Arthritis    Chronic diarrhea    Chronic diastolic CHF (congestive heart failure) (HCC)    Hemorrhoid    Hyperthyroidism    Hypertrophic cardiomegaly    a. history of hypertrophic cardiomyopathy (mild septal focal hypertrophy) by echo in 2015 but this has not been seen on more recent echoes.   IBS (irritable bowel syndrome)    Insomnia    Miscarriage    Osteoporosis    Persistent atrial fibrillation (HCC)    Pulmonary hypertension (HCC)    Thyromegaly    Vitamin D  deficiency     Past Surgical History:  Procedure Laterality Date   CATARACT EXTRACTION     DILATION AND CURETTAGE OF UTERUS     EAR CYST EXCISION  11/09/2011   Procedure: CYST REMOVAL;  Surgeon: Tami Ribas, MD;  Location: [facility];  Service: Orthopedics;  Laterality: Right;  right index excision cyst/foreign body and debridement DIP joint   GANGLION CYST EXCISION     rt wrist   GANGLION CYST EXCISION     TONSILLECTOMY     WISDOM TOOTH EXTRACTION      Family History  Problem Relation Age of Onset   Vascular Disease Mother    Diabetes Father    Dementia Father    Lymphoma Sister     Social History:  reports that she quit smoking about 34 years ago. Her smoking use included cigarettes. She has never used smokeless tobacco. She reports current alcohol use. She reports that she does not use drugs.  Allergies:  Allergies  Allergen Reactions   Amoxicillin Rash    Medications: I have reviewed the patient's current medications.  The PMH, PSH, Medications, Allergies, and SH were reviewed and updated.  ROS: Constitutional: Negative for fever, weight loss and weight gain. Cardiovascular: Negative for chest pain and dyspnea on exertion. Respiratory: Is not experiencing shortness of breath at rest. Gastrointestinal: Negative for nausea and vomiting. Neurological: Negative for  headaches. Psychiatric: The patient is not nervous/anxious  Blood pressure (!) 152/53, pulse (!) 50, height 5\' 4"  (1.626 m), weight 124 lb 12.8 oz (56.6 kg), SpO2 97%.  PHYSICAL EXAM:  Exam: General: Well-developed, well-nourished Respiratory Respiratory effort: Equal inspiration and expiration without stridor Cardiovascular Peripheral Vascular: Warm extremities with equal color/perfusion Eyes: No nystagmus with equal extraocular motion bilaterally Neuro/Psych/Balance: Patient oriented to person, place, and time; Appropriate mood and affect; Gait is intact with no imbalance; Cranial  nerves I-XII are intact Head and Face Inspection: Normocephalic and atraumatic without mass or lesion Palpation: Facial skeleton intact without bony stepoffs Salivary Glands: No mass or tenderness Facial Strength: Facial motility symmetric and full bilaterally ENT Pinna: External ear intact and fully developed External canal: Canal is patent with intact skin Tympanic Membrane: Clear and mobile External Nose: No scar or anatomic deformity Internal Nose: Septum intact and midline. No edema, polyp, or rhinorrhea Lips, Teeth, and gums: Mucosa and teeth intact and viable TMJ: No pain to palpation with full mobility Oral cavity/oropharynx: No erythema or exudate, no lesions present Neck Neck and Trachea: Midline trachea without mass or lesion Thyroid: No mass or nodularity Lymphatics: No lymphadenopathy  Procedure:    PROCEDURE NOTE: nasal endoscopy  Preoperative diagnosis: chronic nasal congestion symptoms, left-sided muffled hearing/suspected eustachian tube dysfunction  Postoperative diagnosis: same  Procedure: Diagnostic nasal endoscopy (36644)  Surgeon: Ashok Croon, M.D.  Anesthesia: Topical lidocaine and Afrin  H&P REVIEW: The patient's history and physical were reviewed today prior to procedure. All medications were reviewed and updated as well. Complications: None Condition is stable throughout exam Indications and consent: The patient presents with symptoms of chronic sinusitis not responding to previous therapies. All the risks, benefits, and potential complications were reviewed with the patient preoperatively and informed consent was obtained. The time out was completed with confirmation of the correct procedure.   Procedure: The patient was seated upright in the clinic. Topical lidocaine and Afrin were applied to the nasal cavity. After adequate anesthesia had occurred, the rigid nasal endoscope was passed into the nasal cavity. The nasal mucosa, turbinates, septum,  and sinus drainage pathways were visualized bilaterally. This revealed no purulence or significant secretions that might be cultured. There were no polyps or sites of significant inflammation. The mucosa was intact and there was no crusting present. The scope was then slowly withdrawn and the patient tolerated the procedure well. There were no complications or blood loss.    Studies Reviewed:labs for Graves - negative thyrotropin receptor and normal TSH/T4  Audiogram 03/01/2023   Assessment/Plan: Encounter Diagnoses  Name Primary?   Dysfunction of both eustachian tubes    Tinnitus of both ears    Sensorineural hearing loss (SNHL) of both ears Yes   Chronic nasal congestion    Environmental and seasonal allergies      82 year old female with history of Graves' disease currently well-controlled on methimazole and atenolol, reported thyrotoxicosis requiring admission a couple years ago followed by endocrine, here for chronic tinnitus described as bilateral high-pitched sound nearly constant and recurrent cerumen impaction requiring ear cleanings every 3 to 4 months.  She also reports symptoms of muffled hearing and clogged.  Previously seen by [facility] physicians.  Denies ear pain.  Last audiogram 5 years ago after which she had bilateral ear tubes placed, that had fallen out since the procedure.  No other ear surgeries.  Denies symptoms of nasal congestion or allergies.   On exam today following cerumen removal bilaterally, both TMs appear intact  and there is no evidence of fluid in the middle ear.  The remainder of her exam was unremarkable, aside from palpable thyromegaly.  Records indicate that she had normal TFTs and regularly seeing endocrinology.   We discussed the need for hearing evaluation and will order audiogram, she will return after testing.   Update 03/22/2023  Bilateral sensorineural hearing loss mild sloping to severe, symmetric, with age-related hearing loss -Proceed with  consultation for hearing aids -Annual hearing evaluation monitor  2.  Impaction requiring frequent ear cleanings -No evidence of cerumen impaction today  3.  Ear fullness sensation of muffled hearing, this could be related to intermittent eustachian tube dysfunction in the setting of nasal congestion -We performed nasal endoscopy today due to unilateral nature of her symptoms and there was no evidence of masses or lesions in nasopharynx, but she had mucosal edema along bilateral nasal passages and mild nasal septal deviation to the left as well as inferior turban hypertrophy -Nasonex 2 puffs bilateral nares twice daily and Clarinex 5 mg nightly  4.  Eustachian tube dysfunction -Management of nasal congestion as above   Ashok Croon, MD Otolaryngology [facility] Phone: [phone] Fax: [phone]    03/22/2023, 2:46 PM

## 2023-03-22 NOTE — Patient Instructions (Signed)

## 2023-03-27 DIAGNOSIS — M1711 Unilateral primary osteoarthritis, right knee: Secondary | ICD-10-CM | POA: Diagnosis not present

## 2023-03-30 ENCOUNTER — Ambulatory Visit: Payer: Medicare HMO | Admitting: Internal Medicine

## 2023-03-30 ENCOUNTER — Encounter: Payer: Self-pay | Admitting: Internal Medicine

## 2023-03-30 VITALS — BP 110/60 | HR 61 | Ht 64.0 in | Wt 126.0 lb

## 2023-03-30 DIAGNOSIS — E0500 Thyrotoxicosis with diffuse goiter without thyrotoxic crisis or storm: Secondary | ICD-10-CM

## 2023-03-30 LAB — T4, FREE: Free T4: 0.83 ng/dL (ref 0.60–1.60)

## 2023-03-30 LAB — TSH: TSH: 1.54 u[IU]/mL (ref 0.35–5.50)

## 2023-03-30 LAB — T3, FREE: T3, Free: 3.3 pg/mL (ref 2.3–4.2)

## 2023-03-30 MED ORDER — METHIMAZOLE 5 MG PO TABS: 2.5000 mg | ORAL_TABLET | Freq: Every day | ORAL | Status: DC

## 2023-03-30 NOTE — Progress Notes (Unsigned)
Patient ID: Lynn Hayden, female   DOB: Apr 05, 1941, 82 y.o.   MRN: [id number]   HPI  Lynn Hayden is a 82 y.o.-year-old female, initially referred by her cardiologist, Dr. Antoine Poche, returning for follow-up for thyrotoxicosis.  Last visit 1 year and 5 months ago.  Interim history:  She also continues to have diarrhea-on Lomotil also chronic. No palpitations, anxiety, tremors, hot flashes. She has chronic insomnia - on Temazepan for >10 years. She has some dizziness, not positional. She saw ENT >> started an allergy med. and a nasal spray.   Reviewed history: Patient has a history of A. fib and was admitted with RVR twice in the last 2 months: 08/25/2018 and 09/30/2019.  During her admission from 09/2019, she was found to be thyrotoxic. Retrospectively, she had SOB, palpitations for at least 6 months.   On 10/07/2019, She was started on: - Methimazole 10 mg 2x a day - Atenolol 50 mg 2x a day  10/22/2019: Decreased methimazole to 5 mg twice daily  11/26/2019: Decreased methimazole to 5 mg daily  01/12/2020: Decreased methimazole to 2.5 mg daily  03/17/2020: Increased methimazole to 5 mg daily  She felt much better after starting methimazole, without shortness of breath.  02/12/2020: She felt more fatigued so I advised her to contact cardiology to see if she could decrease her atenolol dose>> dose was decreased atenolol 50 mg 1x a day in am.  05/12/2020: Decreased methimazole to 2.5 mg daily  11/2020: Advised her to decrease atenolol to 25 mg daily, but she developed palpitations and increased it back to 50 mg daily.  11/2021: Decreased methimazole to 2.5 mg every other day.  Reviewed her TFTs: 10/18/2022: TSH 2.17 Lab Results  Component Value Date   TSH 1.65 05/12/2022   TSH 2.98 12/26/2021   TSH 3.74 11/10/2021   TSH 2.48 05/11/2021   TSH 0.60 11/10/2020   TSH 0.47 09/29/2020   TSH 0.46 08/18/2020   TSH 0.94 06/16/2020   TSH 6.28 (H) 05/12/2020   TSH 0.03 (L) 03/17/2020    FREET4 0.75 05/12/2022   FREET4 0.83 12/26/2021   FREET4 0.91 11/10/2021   FREET4 0.90 05/11/2021   FREET4 0.92 11/10/2020   FREET4 0.97 09/29/2020   FREET4 0.84 08/18/2020   FREET4 1.07 06/16/2020   FREET4 0.67 05/12/2020   FREET4 0.94 03/17/2020   T3FREE 2.9 12/26/2021   T3FREE 3.1 11/10/2021   T3FREE 3.0 05/11/2021   T3FREE 3.0 11/10/2020   T3FREE 3.1 09/29/2020   T3FREE 2.9 08/18/2020   T3FREE 3.2 06/16/2020   T3FREE 2.3 05/12/2020   T3FREE 3.4 03/17/2020   T3FREE 2.8 02/12/2020   The Graves' antibodies were elevated, but improved:  Lab Results  Component Value Date   TSI 284 (H) 11/10/2020   TSI 516 (H) 10/22/2019   TSI 7.24 (H) 10/01/2019  07/01/2014: TPO Abs 1 (<9)  Thyroid ultrasound (05/26/2014) checked due to history of enlarged thyroid was normal: -Normal gland size -No thyroid nodules -No lymphadenopathy  Pt denies: - feeling nodules in neck - hoarseness - dysphagia - choking  No FH of thyroid disease or thyroid cancer. No h/o radiation tx to head or neck. No Biotin use. No recent steroids use.   She has IBS with diarrhea. She also has a history of osteoporosis. On denosumab.  This is managed by PCP.  ROS: + see HPI  I reviewed pt's medications, allergies, PMH, social hx, family hx, and changes were documented in the history of present illness. Otherwise, unchanged from  my initial visit note.  Past Medical History:  Diagnosis Date   Arthritis    Chronic diarrhea    Chronic diastolic CHF (congestive heart failure) (HCC)    Hemorrhoid    Hyperthyroidism    Hypertrophic cardiomegaly    a. history of hypertrophic cardiomyopathy (mild septal focal hypertrophy) by echo in 2015 but this has not been seen on more recent echoes.   IBS (irritable bowel syndrome)    Insomnia    Miscarriage    Osteoporosis    Persistent atrial fibrillation (HCC)    Pulmonary hypertension (HCC)    Thyromegaly    Vitamin D deficiency    Past Surgical History:   Procedure Laterality Date   CATARACT EXTRACTION     DILATION AND CURETTAGE OF UTERUS     EAR CYST EXCISION  11/09/2011   Procedure: CYST REMOVAL;  Surgeon: Tami Ribas, MD;  Location: [facility];  Service: Orthopedics;  Laterality: Right;  right index excision cyst/foreign body and debridement DIP joint   GANGLION CYST EXCISION     rt wrist   GANGLION CYST EXCISION     TONSILLECTOMY     WISDOM TOOTH EXTRACTION     Social History   Socioeconomic History   Marital status: Married    Spouse name: Not on file   Number of children: 3   Years of education: Not on file   Highest education level: Not on file  Occupational History    Comment: retired  Tobacco Use   Smoking status: Former    Current packs/day: 0.00    Types: Cigarettes    Quit date: 11/06/1988    Years since quitting: 34.4   Smokeless tobacco: Never  Substance and Sexual Activity   Alcohol use: Yes    Comment: rare   Drug use: No   Sexual activity: Not Currently  Other Topics Concern   Not on file  Social History Narrative   Lives with husband.  Has 2 biological children & 1 adopted child.  Takes care of disables son.  Still works and does all the housework   Social Determinants of Corporate investment banker Strain: Not on file  Food Insecurity: Not on file  Transportation Needs: Not on file  Physical Activity: Not on file  Stress: Not on file  Social Connections: Unknown (10/14/2021)   Received from [facility], [facility]   Social Network    Social Network: Not on file  Intimate Partner Violence: Unknown (09/05/2021)   Received from [facility], [facility]   HITS    Physically Hurt: Not on file    Insult or Talk Down To: Not on file    Threaten Physical Harm: Not on file    Scream or Curse: Not on file   Current Outpatient Medications on File Prior to Visit  Medication Sig Dispense Refill   atenolol (TENORMIN) 50 MG tablet Take 1 tablet (50 mg total) by mouth daily. 90 tablet  3   Calcium Carb-Cholecalciferol [phone] MG-UNIT TABS Take 2 tablets by mouth daily.      denosumab (PROLIA) 60 MG/ML SOSY injection Inject 60 mg into the skin every 6 (six) months.     desloratadine (CLARINEX) 5 MG tablet Take 1 tablet (5 mg total) by mouth daily. 90 tablet 3   diphenoxylate-atropine (LOMOTIL) 2.5-0.025 MG per tablet Take 1 tablet by mouth as needed for diarrhea or loose stools. 30 tablet 4   furosemide (LASIX) 20 MG tablet Take 1 tablet (20  mg total) by mouth daily as needed (for fluid retention, or weight gain of 3-5lb). 90 tablet 3   meloxicam (MOBIC) 15 MG tablet meloxicam 15 mg tablet  Take 1 tablet daily with food for 5-7 days, then as needed     methimazole (TAPAZOLE) 5 MG tablet TAKE 1/2 TABLET BY MOUTH DAILY (Patient taking differently: Take 2.5 mg by mouth daily. Every other day) 45 tablet 0   mometasone (NASONEX) 50 MCG/ACT nasal spray Place 2 sprays into the nose daily. 17 g 12   sodium chloride (OCEAN) 0.65 % SOLN nasal spray Place 1 spray into both nostrils as needed. 30 mL 5   temazepam (RESTORIL) 15 MG capsule Take 15 mg by mouth at bedtime as needed for sleep.     Vitamin D, Cholecalciferol, 25 MCG (1000 UT) CAPS Take 1,000 Units by mouth daily.     No current facility-administered medications on file prior to visit.   Allergies  Allergen Reactions   Amoxicillin Rash   Family History  Problem Relation Age of Onset   Vascular Disease Mother    Diabetes Father    Dementia Father    Lymphoma Sister    PE: BP 110/60   Pulse 61   Ht 5\' 4"  (1.626 m)   Wt 126 lb (57.2 kg)   SpO2 96%   BMI 21.63 kg/m  Wt Readings from Last 10 Encounters:  03/30/23 126 lb (57.2 kg)  03/22/23 124 lb 12.8 oz (56.6 kg)  02/12/23 124 lb (56.2 kg)  11/10/21 127 lb 9.6 oz (57.9 kg)  05/11/21 126 lb 12.8 oz (57.5 kg)  11/10/20 126 lb 6.4 oz (57.3 kg)  10/06/20 129 lb 12.8 oz (58.9 kg)  05/12/20 126 lb 9.6 oz (57.4 kg)  04/22/20 123 lb 9.6 oz (56.1 kg)  02/12/20 120 lb  (54.4 kg)   Constitutional: normal weight, in NAD Eyes: EOMI, no exophthalmos ENT: no thyromegaly, no cervical lymphadenopathy Cardiovascular: RRR, No MRG Respiratory: CTA B Musculoskeletal: no deformities Skin: no rashes Neurological: no tremor with outstretched hands  ASSESSMENT: 1.  Graves' disease  PLAN:  1. Patient with history of thyrotoxicosis (low TSH, high free thyroid hormones) found during an admission for A-fib with RVR.  She had thyrotoxic symptoms: Mild weight loss, palpitations, shortness of breath, fatigue, insomnia, diarrhea, however, the diarrhea was longstanding, in the setting of IBS.  A TSI antibodies were elevated, pointing towards a diagnosis of Graves' disease.  She was started on methimazole and atenolol in 09/2019, after which she started to feel better right away.  Palpitations resolved, as did her shortness of breath.  Afterwards, we were able to decrease the methimazole dose to 2.5 mg daily.  We had to vary the dose afterwards, but at last visit, she was on 2.5 mg daily, dose decreased 05/2020.  I advised her to decrease the dose to 2.5 mg every other day in 11/2021 as her TSH was 3.74, higher in the normal range.  At that time, TRAb returned undetectable.  A repeat TSH was normal in 05/2022 and again in 10/2022. -She continues on atenolol 50 mg daily.  We tried to decrease the dose to 25 mg daily (due to weight gain) in 2022, but she developed palpitations and had to go back to the previous dose.  At today's visit she wonders whether she needs to continue on this for not.  From the thyroid point of view, she does not need this anymore, but due to her previous episode of A-fib with  higher and more recent intolerance to decreasing the dose, I recommended to check again with cardiology, Dr. Antoine Poche, who originally prescribed the medication.  -At today's visit, she has no thyrotoxic signs or symptoms.  No tremors, weight loss, heat intolerance, anxiety. -She has no neck  compression symptoms.  She had a thyroid ultrasound in 2015 and there were no nodules.  Also, no masses felt on palpation of her neck today -She has no active signs of Graves' ophthalmopathy: No blurry vision, double vision, eye pain, chemosis -We will recheck her TFTs along with TSI antibodies and see if we can stop methimazole.   -I will see her back in a year but in 6 months for labs  Needs refills.  Carlus Pavlov, MD PhD [facility]

## 2023-03-30 NOTE — Patient Instructions (Addendum)
Please continue methimazole 2.5 mg every other day.  Please stop at the lab.  Please come back for a follow-up appointment in 1 year.

## 2023-04-03 ENCOUNTER — Encounter: Payer: Self-pay | Admitting: Internal Medicine

## 2023-04-03 DIAGNOSIS — M1711 Unilateral primary osteoarthritis, right knee: Secondary | ICD-10-CM | POA: Diagnosis not present

## 2023-04-03 LAB — THYROID STIMULATING IMMUNOGLOBULIN: TSI: 93 %{baseline} (ref <140)

## 2023-04-04 ENCOUNTER — Other Ambulatory Visit: Payer: Self-pay | Admitting: Internal Medicine

## 2023-04-10 DIAGNOSIS — M1711 Unilateral primary osteoarthritis, right knee: Secondary | ICD-10-CM | POA: Diagnosis not present

## 2023-06-07 ENCOUNTER — Other Ambulatory Visit: Payer: Self-pay

## 2023-06-07 DIAGNOSIS — E0500 Thyrotoxicosis with diffuse goiter without thyrotoxic crisis or storm: Secondary | ICD-10-CM

## 2023-06-08 ENCOUNTER — Other Ambulatory Visit: Payer: Medicare HMO

## 2023-06-08 DIAGNOSIS — E0500 Thyrotoxicosis with diffuse goiter without thyrotoxic crisis or storm: Secondary | ICD-10-CM | POA: Diagnosis not present

## 2023-06-09 LAB — TSH: TSH: 1.24 m[IU]/L (ref 0.40–4.50)

## 2023-06-09 LAB — T4, FREE: Free T4: 1.2 ng/dL (ref 0.8–1.8)

## 2023-06-09 LAB — T3, FREE: T3, Free: 2.9 pg/mL (ref 2.3–4.2)

## 2023-06-11 ENCOUNTER — Other Ambulatory Visit: Payer: Self-pay | Admitting: Internal Medicine

## 2023-06-11 ENCOUNTER — Encounter: Payer: Self-pay | Admitting: Internal Medicine

## 2023-06-11 DIAGNOSIS — E0500 Thyrotoxicosis with diffuse goiter without thyrotoxic crisis or storm: Secondary | ICD-10-CM

## 2023-06-15 ENCOUNTER — Ambulatory Visit (INDEPENDENT_AMBULATORY_CARE_PROVIDER_SITE_OTHER): Payer: Medicare HMO | Admitting: Otolaryngology

## 2023-07-04 DIAGNOSIS — Z79899 Other long term (current) drug therapy: Secondary | ICD-10-CM | POA: Diagnosis not present

## 2023-07-04 DIAGNOSIS — Z1211 Encounter for screening for malignant neoplasm of colon: Secondary | ICD-10-CM | POA: Diagnosis not present

## 2023-07-04 DIAGNOSIS — G4700 Insomnia, unspecified: Secondary | ICD-10-CM | POA: Diagnosis not present

## 2023-07-04 DIAGNOSIS — Z0000 Encounter for general adult medical examination without abnormal findings: Secondary | ICD-10-CM | POA: Diagnosis not present

## 2023-07-04 DIAGNOSIS — E0500 Thyrotoxicosis with diffuse goiter without thyrotoxic crisis or storm: Secondary | ICD-10-CM | POA: Diagnosis not present

## 2023-07-04 DIAGNOSIS — M810 Age-related osteoporosis without current pathological fracture: Secondary | ICD-10-CM | POA: Diagnosis not present

## 2023-07-04 DIAGNOSIS — E785 Hyperlipidemia, unspecified: Secondary | ICD-10-CM | POA: Diagnosis not present

## 2023-07-12 DIAGNOSIS — Z1211 Encounter for screening for malignant neoplasm of colon: Secondary | ICD-10-CM | POA: Diagnosis not present

## 2023-08-16 DIAGNOSIS — H524 Presbyopia: Secondary | ICD-10-CM | POA: Diagnosis not present

## 2023-08-16 DIAGNOSIS — H26492 Other secondary cataract, left eye: Secondary | ICD-10-CM | POA: Diagnosis not present

## 2023-08-16 DIAGNOSIS — Z0100 Encounter for examination of eyes and vision without abnormal findings: Secondary | ICD-10-CM | POA: Diagnosis not present

## 2023-08-16 DIAGNOSIS — Z961 Presence of intraocular lens: Secondary | ICD-10-CM | POA: Diagnosis not present

## 2023-08-20 DIAGNOSIS — M5431 Sciatica, right side: Secondary | ICD-10-CM | POA: Diagnosis not present

## 2023-09-06 ENCOUNTER — Ambulatory Visit (INDEPENDENT_AMBULATORY_CARE_PROVIDER_SITE_OTHER): Payer: Medicare HMO | Admitting: Otolaryngology

## 2023-09-06 ENCOUNTER — Encounter (INDEPENDENT_AMBULATORY_CARE_PROVIDER_SITE_OTHER): Payer: Self-pay | Admitting: Otolaryngology

## 2023-09-06 VITALS — BP 145/70 | HR 53 | Ht 64.0 in | Wt 125.0 lb

## 2023-09-06 DIAGNOSIS — H6990 Unspecified Eustachian tube disorder, unspecified ear: Secondary | ICD-10-CM

## 2023-09-06 DIAGNOSIS — H9313 Tinnitus, bilateral: Secondary | ICD-10-CM | POA: Diagnosis not present

## 2023-09-06 DIAGNOSIS — H9193 Unspecified hearing loss, bilateral: Secondary | ICD-10-CM

## 2023-09-06 DIAGNOSIS — H6123 Impacted cerumen, bilateral: Secondary | ICD-10-CM

## 2023-09-06 DIAGNOSIS — H6122 Impacted cerumen, left ear: Secondary | ICD-10-CM

## 2023-09-06 DIAGNOSIS — J3089 Other allergic rhinitis: Secondary | ICD-10-CM

## 2023-09-06 DIAGNOSIS — H903 Sensorineural hearing loss, bilateral: Secondary | ICD-10-CM | POA: Diagnosis not present

## 2023-09-06 DIAGNOSIS — R0981 Nasal congestion: Secondary | ICD-10-CM

## 2023-09-06 DIAGNOSIS — H6993 Unspecified Eustachian tube disorder, bilateral: Secondary | ICD-10-CM

## 2023-09-06 MED ORDER — MOMETASONE FUROATE 50 MCG/ACT NA SUSP: 2.0000 | Freq: Every day | NASAL | 12 refills | Status: DC

## 2023-09-06 MED ORDER — LORATADINE 10 MG PO TABS: 10.0000 mg | ORAL_TABLET | Freq: Every day | ORAL | 11 refills | Status: DC

## 2023-09-06 NOTE — Patient Instructions (Signed)
 See information about Eustachian Tube Dysfunction below:    Overview The eustachian (say "you-STAY-shee-un") tubes connect the middle ear on each side to the back of the throat. They keep air pressure stable in the ears. If your eustachian tubes become blocked, the air pressure in your ears changes. A quick change in air pressure can cause eustachian tubes to close up. This might happen when an airplane changes altitude or when a scuba diver goes up or down underwater. And a cold can make the tubes swell and block the fluid in the middle ear from draining out. That can cause pain.  Eustachian tube problems often clear up on their own or after treating the cause of the blockage. If your tubes continue to be blocked, you may need surgery.  Follow-up care is a key part of your treatment and safety. Be sure to make and go to all appointments, and call your doctor or nurse advice line (811 in most provinces and territories) if you are having problems. It's also a good idea to know your test results and keep a list of the medicines you take.  How can you care for yourself at home? Try a simple exercise to help open blocked tubes. Close your mouth, hold your nose, and gently blow as if you are blowing your nose. Yawning and chewing gum also may help. You may hear or feel a "pop" when the tubes open. To ease ear pain, apply a warm face cloth or a heating pad set on low. There may be some drainage from the ear when the heat melts earwax. Put a cloth between the heat source and your skin. If your doctor prescribed antibiotics, take them as directed. Do not stop taking them just because you feel better. You need to take the full course of antibiotics. Be safe with medicines. Depending on the cause of the problem, your doctor may recommend over-the-counter medicine. For example, adults may try decongestants for cold symptoms or nasal spray steroids for allergies. Follow the instructions carefully.  [facility]  Nasal Saline Rinse   - start nasal saline rinses with [employer] available over the counter or online to help with nasal congestion

## 2023-09-06 NOTE — Progress Notes (Signed)
 ENT Progress Note:   Update 09/06/2023  Discussed the use of AI scribe software for clinical note transcription with the patient, who gave verbal consent to proceed.  History of Present Illness   Lynn Hayden is an 83 year old female previously seen for cerumen impaction and eustachian tube dysfunction who presents for f/u of tinnitus and chronic nasal congestion.  She experiences nasal congestion, resulting in ear fullness, muffled hearing, and the sensation of needing to pop her ears. She uses Flonase nasal spray and an allergy pill to manage these symptoms. A previous nasal endoscopy showed no blockage of the Eustachian tube opening.  Her tinnitus has worsened over time without improvement. She underwent a hearing test previously, and the importance of annual screenings for her hearing loss was discussed. She does not wear hearing aids, but her hearing test showed SNHL mild sloping to moderate severe  She experiences epistaxis in am sometimes, low-volume and self-resolving. She uses Flonase nasal spray currently.   Records Reviewed:  Initial Evaluation   Update 03/22/2023: She returns for follow-up after hearing test which showed bilateral sensorineural hearing loss which appears to be age-related and symmetric, middle ear function at the time of the test.  She continues to have sensation of clogged up ears mostly on the left side.   Initial evaluation  Reason for Consult: muffled hearing tinnitus and recurrent cerumen impaction    HPI: Lynn Hayden is an 83 y.o. 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 Had Audio done 5 yrs ago and then ear tubes that have fallen out since.  She goes to [facility] for ear cleaning every 4 months and is frustrated that her ears get clogged so fast. She has had tinnitus for  a long time.  Tinnitus is bilateral high-pitch steady sound and has had it for 30 yrs. Her sister had tinnitus since she was very young. She is not sure what her last hearing test showed Hx of Graves Dz - hx of admission 2/2 thyrotoxicosis on Atenolol and Methimazole.   Records Reviewed:  Note from PCP Dr Lenise Arena - telehealth  visit  Lynn Hayden - patient requested referral to [place] for hearing eval/hearing aid consultation and cerumen issue  thyroid lab results from 10/21/2022 and they are excellent [name] - 10/23/22    Past Medical History:  Diagnosis Date   Arthritis    Chronic diarrhea    Chronic diastolic CHF (congestive heart failure) (HCC)    Hemorrhoid    Hyperthyroidism    Hypertrophic cardiomegaly    a. history of hypertrophic cardiomyopathy (mild septal focal hypertrophy) by echo in 2015 but this has not been seen on more recent echoes.   IBS (irritable bowel syndrome)    Insomnia    Miscarriage    Osteoporosis    Persistent atrial fibrillation (HCC)    Pulmonary hypertension (HCC)    Thyromegaly    Vitamin D deficiency     Past Surgical History:  Procedure Laterality Date   CATARACT EXTRACTION     DILATION AND CURETTAGE OF UTERUS     EAR CYST EXCISION  11/09/2011   Procedure: CYST REMOVAL;  Surgeon: Tami Ribas, MD;  Location: [facility];  Service: Orthopedics;  Laterality: Right;  right index excision cyst/foreign body and debridement DIP joint  GANGLION CYST EXCISION     rt wrist   GANGLION CYST EXCISION     TONSILLECTOMY     WISDOM TOOTH EXTRACTION      Family History  Problem Relation Age of Onset   Vascular Disease Mother    Diabetes Father    Dementia Father    Lymphoma Sister     Social History:  reports that she quit smoking about 34 years ago. Her smoking use included cigarettes. She has never used smokeless tobacco. She reports current alcohol use. She reports that she does not use drugs.  Allergies:  Allergies   Allergen Reactions   Amoxicillin Rash    Medications: I have reviewed the patient's current medications.  The PMH, PSH, Medications, Allergies, and SH were reviewed and updated.  ROS: Constitutional: Negative for fever, weight loss and weight gain. Cardiovascular: Negative for chest pain and dyspnea on exertion. Respiratory: Is not experiencing shortness of breath at rest. Gastrointestinal: Negative for nausea and vomiting. Neurological: Negative for headaches. Psychiatric: The patient is not nervous/anxious  Blood pressure (!) 145/70, pulse (!) 53, height 5\' 4"  (1.626 m), weight 125 lb (56.7 kg), SpO2 97%.  PHYSICAL EXAM:  Exam: General: Well-developed, well-nourished Respiratory Respiratory effort: Equal inspiration and expiration without stridor Cardiovascular Peripheral Vascular: Warm extremities with equal color/perfusion Eyes: No nystagmus with equal extraocular motion bilaterally Neuro/Psych/Balance: Patient oriented to person, place, and time; Appropriate mood and affect; Gait is intact with no imbalance; Cranial nerves I-XII are intact Head and Face Inspection: Normocephalic and atraumatic without mass or lesion Palpation: Facial skeleton intact without bony stepoffs Salivary Glands: No mass or tenderness Facial Strength: Facial motility symmetric and full bilaterally ENT Pinna: External ear intact and fully developed External canal: Canal is patent with intact skin, left side with cerumen removed Tympanic Membrane: Clear and mobile External Nose: No scar or anatomic deformity Internal Nose: Septum intact and midline. No edema, polyp, or rhinorrhea Lips, Teeth, and gums: Mucosa and teeth intact and viable TMJ: No pain to palpation with full mobility Oral cavity/oropharynx: No erythema or exudate, no lesions present Neck Neck and Trachea: Midline trachea without mass or lesion Thyroid: No mass or nodularity Lymphatics: No lymphadenopathy  Procedure:    Procedure: Cerumen Removal, left (CPT [phone])  Diagnosis: cerumen impaction, left  Informed consent: Timeout performed and informed consent was obtained.  Procedure: Operating microscope was employed to evaluate the ear(s).  Cerumen curette, speculum and suction were employed to clear the cerumen.   Findings: Normal appearing tympanic membrane on the left without perforations, and external canals are normal after removal of cerumen.No middle ear fluid bilaterally.   Complications: None. Patient tolerated well.      Studies Reviewed:labs for Graves - negative thyrotropin receptor and normal TSH/T4  Audiogram 03/01/2023   Assessment/Plan: Encounter Diagnoses  Name Primary?   Dysfunction of both eustachian tubes Yes   Sensorineural hearing loss (SNHL) of both ears    Tinnitus of both ears    Chronic nasal congestion    Environmental and seasonal allergies    Bilateral impacted cerumen    Decreased hearing of both ears    Impacted cerumen of left ear       83 year old female with history of Graves' disease currently well-controlled on methimazole and atenolol, reported thyrotoxicosis requiring admission a couple years ago followed by endocrine, here for chronic tinnitus described as bilateral high-pitched sound nearly constant and recurrent cerumen impaction requiring ear cleanings every 3 to 4 months.  She also reports symptoms  of muffled hearing and clogged.  Previously seen by [facility] physicians.  Denies ear pain.  Last audiogram 5 years ago after which she had bilateral ear tubes placed, that had fallen out since the procedure.  No other ear surgeries.  Denies symptoms of nasal congestion or allergies.   On exam today following cerumen removal bilaterally, both TMs appear intact and there is no evidence of fluid in the middle ear.  The remainder of her exam was unremarkable, aside from palpable thyromegaly.  Records indicate that she had normal TFTs and regularly seeing  endocrinology.   We discussed the need for hearing evaluation and will order audiogram, she will return after testing.   Update 03/22/2023  Bilateral sensorineural hearing loss mild sloping to severe, symmetric, with age-related hearing loss -Proceed with consultation for hearing aids -Annual hearing evaluation monitor  2.  Impaction requiring frequent ear cleanings -No evidence of cerumen impaction today  3.  Ear fullness sensation of muffled hearing, this could be related to intermittent eustachian tube dysfunction in the setting of nasal congestion -We performed nasal endoscopy today due to unilateral nature of her symptoms and there was no evidence of masses or lesions in nasopharynx, but she had mucosal edema along bilateral nasal passages and mild nasal septal deviation to the left as well as inferior turban hypertrophy -Nasonex 2 puffs bilateral nares twice daily and Clarinex 5 mg nightly  4.  Eustachian tube dysfunction -Management of nasal congestion as above   Update 09/06/2023 Assessment and Plan    Eustachian tube dysfunction Eustachian tube dysfunction likely due to chronic nasal congestion, exacerbated by pollen season. No physical blockage found on nasal endoscopy done during her past vist Treatment focuses on managing nasal congestion. - Recommend nasal rinses with saline to reduce nasal congestion. - Continue using Nasonex nasal spray and refill prescription. - Continue taking Claritin for allergies and refill prescription.  Epistaxis Intermittent epistaxis likely related to nasal spray use and dry nasal passages. No visible irritation or bleeding source on examination. - Advised applying petrolatum to each side of the nose in the morning and at night. - Instruct to aim nasal spray laterally   Sensorineural Hearing loss b/l and tinnitus Hearing loss is well-managed with no recent changes. Importance of annual hearing screenings discussed. - Continue with annual  hearing screenings.     Cerumen impaction Left side  - cleared today   Ashok Croon, MD Otolaryngology [facility] Phone: [phone]  Fax: [phone]    09/06/2023, 8:36 PM

## 2023-09-24 DIAGNOSIS — Z79899 Other long term (current) drug therapy: Secondary | ICD-10-CM | POA: Diagnosis not present

## 2023-10-03 DIAGNOSIS — M810 Age-related osteoporosis without current pathological fracture: Secondary | ICD-10-CM | POA: Diagnosis not present

## 2023-10-12 ENCOUNTER — Encounter: Payer: Self-pay | Admitting: Internal Medicine

## 2023-10-12 ENCOUNTER — Other Ambulatory Visit: Payer: Medicare HMO

## 2023-10-12 DIAGNOSIS — E0500 Thyrotoxicosis with diffuse goiter without thyrotoxic crisis or storm: Secondary | ICD-10-CM | POA: Diagnosis not present

## 2023-10-12 LAB — T4, FREE: Free T4: 1.1 ng/dL (ref 0.8–1.8)

## 2023-10-12 LAB — TSH: TSH: 0.93 m[IU]/L (ref 0.40–4.50)

## 2023-10-12 LAB — T3, FREE: T3, Free: 3.1 pg/mL (ref 2.3–4.2)

## 2023-11-28 DIAGNOSIS — M1711 Unilateral primary osteoarthritis, right knee: Secondary | ICD-10-CM | POA: Diagnosis not present

## 2023-12-31 DIAGNOSIS — Z6821 Body mass index (BMI) 21.0-21.9, adult: Secondary | ICD-10-CM | POA: Diagnosis not present

## 2023-12-31 DIAGNOSIS — R42 Dizziness and giddiness: Secondary | ICD-10-CM | POA: Diagnosis not present

## 2023-12-31 DIAGNOSIS — E0500 Thyrotoxicosis with diffuse goiter without thyrotoxic crisis or storm: Secondary | ICD-10-CM | POA: Diagnosis not present

## 2024-01-03 DIAGNOSIS — E785 Hyperlipidemia, unspecified: Secondary | ICD-10-CM | POA: Diagnosis not present

## 2024-01-03 DIAGNOSIS — M810 Age-related osteoporosis without current pathological fracture: Secondary | ICD-10-CM | POA: Diagnosis not present

## 2024-01-29 DIAGNOSIS — R3915 Urgency of urination: Secondary | ICD-10-CM | POA: Diagnosis not present

## 2024-01-29 DIAGNOSIS — R42 Dizziness and giddiness: Secondary | ICD-10-CM | POA: Diagnosis not present

## 2024-02-03 DIAGNOSIS — E785 Hyperlipidemia, unspecified: Secondary | ICD-10-CM | POA: Diagnosis not present

## 2024-02-03 DIAGNOSIS — M810 Age-related osteoporosis without current pathological fracture: Secondary | ICD-10-CM | POA: Diagnosis not present

## 2024-03-04 DIAGNOSIS — E785 Hyperlipidemia, unspecified: Secondary | ICD-10-CM | POA: Diagnosis not present

## 2024-03-04 DIAGNOSIS — M810 Age-related osteoporosis without current pathological fracture: Secondary | ICD-10-CM | POA: Diagnosis not present

## 2024-03-05 DIAGNOSIS — S51819A Laceration without foreign body of unspecified forearm, initial encounter: Secondary | ICD-10-CM | POA: Diagnosis not present

## 2024-03-18 ENCOUNTER — Encounter: Payer: Self-pay | Admitting: Internal Medicine

## 2024-03-18 ENCOUNTER — Other Ambulatory Visit

## 2024-03-18 ENCOUNTER — Ambulatory Visit: Admitting: Internal Medicine

## 2024-03-18 VITALS — BP 124/72 | HR 52 | Ht 64.0 in | Wt 124.2 lb

## 2024-03-18 DIAGNOSIS — E0500 Thyrotoxicosis with diffuse goiter without thyrotoxic crisis or storm: Secondary | ICD-10-CM

## 2024-03-18 NOTE — Progress Notes (Signed)
 Patient ID: Lynn Hayden, female   DOB: February 18, 1941, 83 y.o.   MRN: [id number]   HPI  Lynn Hayden is a 83 y.o.-year-old female, initially referred by her cardiologist, Dr. Lavona, returning for follow-up for thyrotoxicosis.  Last visit 1 year ago.  Interim history:  She has SOB with exertion. No CP. She continues to have diarrhea-on Lomotil . This is very long standing for her (since elementary school). She had extensive investigation for this. No palpitations, anxiety, tremors, hot flashes.   Reviewed and addended history: Patient has a history of A. fib and was admitted with RVR twice in the last 2 months: 08/25/2018 and 09/30/2019.  During her admission from 09/2019, she was found to be thyrotoxic. Retrospectively, she had SOB, palpitations for at least 6 months.   On 10/07/2019, She was started on: - Methimazole  10 mg 2x a day - Atenolol  50 mg 2x a day  10/22/2019: Decreased methimazole  to 5 mg twice daily  11/26/2019: Decreased methimazole  to 5 mg daily  01/12/2020: Decreased methimazole  to 2.5 mg daily  03/17/2020: Increased methimazole  to 5 mg daily  She felt much better after starting methimazole , without shortness of breath.  02/12/2020: She felt more fatigued so I advised her to contact cardiology to see if she could decrease her atenolol  dose>> dose was decreased atenolol  50 mg 1x a day in am.  05/12/2020: Decreased methimazole  to 2.5 mg daily  11/2020: Advised her to decrease atenolol  to 25 mg daily, but she developed palpitations and increased it back to 50 mg daily.  11/2021: Decreased methimazole  to 2.5 mg every other day.  03/2023: We stopped methimazole .  Reviewed her TFTs: Lab Results  Component Value Date   TSH 0.93 10/12/2023   TSH 1.24 06/08/2023   TSH 1.54 03/30/2023   TSH 1.65 05/12/2022   TSH 2.98 12/26/2021   TSH 3.74 11/10/2021   TSH 2.48 05/11/2021   TSH 0.60 11/10/2020   TSH 0.47 09/29/2020   TSH 0.46 08/18/2020   FREET4 1.1 10/12/2023   FREET4  1.2 06/08/2023   FREET4 0.83 03/30/2023   FREET4 0.75 05/12/2022   FREET4 0.83 12/26/2021   FREET4 0.91 11/10/2021   FREET4 0.90 05/11/2021   FREET4 0.92 11/10/2020   FREET4 0.97 09/29/2020   FREET4 0.84 08/18/2020   T3FREE 3.1 10/12/2023   T3FREE 2.9 06/08/2023   T3FREE 3.3 03/30/2023   T3FREE 2.9 12/26/2021   T3FREE 3.1 11/10/2021   T3FREE 3.0 05/11/2021   T3FREE 3.0 11/10/2020   T3FREE 3.1 09/29/2020   T3FREE 2.9 08/18/2020   T3FREE 3.2 06/16/2020  10/18/2022: TSH 2.17  The Graves' antibodies were elevated, but improved: Lab Results  Component Value Date   TSI 93 03/30/2023   TSI 284 (H) 11/10/2020   TSI 516 (H) 10/22/2019   TSI 7.24 (H) 10/01/2019   07/01/2014: TPO Abs 1 (<9)  Thyroid  ultrasound (05/26/2014) checked due to history of enlarged thyroid  was normal: -Normal gland size -No thyroid  nodules -No lymphadenopathy  Pt denies: - feeling nodules in neck - hoarseness - dysphagia - choking  No FH of thyroid  disease or thyroid  cancer. No h/o radiation tx to head or neck. No Biotin use. No recent steroids use.   She has IBS with diarrhea. She also has a history of osteoporosis. On denosumab .  This is managed by PCP.  ROS: + see HPI  I reviewed pt's medications, allergies, PMH, social hx, family hx, and changes were documented in the history of present illness. Otherwise, unchanged from my  initial visit note.  Past Medical History:  Diagnosis Date   Arthritis    Chronic diarrhea    Chronic diastolic CHF (congestive heart failure) (HCC)    Hemorrhoid    Hyperthyroidism    Hypertrophic cardiomegaly    a. history of hypertrophic cardiomyopathy (mild septal focal hypertrophy) by echo in 2015 but this has not been seen on more recent echoes.   IBS (irritable bowel syndrome)    Insomnia    Miscarriage    Osteoporosis    Persistent atrial fibrillation (HCC)    Pulmonary hypertension (HCC)    Thyromegaly    Vitamin D  deficiency    Past Surgical  History:  Procedure Laterality Date   CATARACT EXTRACTION     DILATION AND CURETTAGE OF UTERUS     EAR CYST EXCISION  11/09/2011   Procedure: CYST REMOVAL;  Surgeon: Franky JONELLE Curia, MD;  Location: [facility];  Service: Orthopedics;  Laterality: Right;  right index excision cyst/foreign body and debridement DIP joint   GANGLION CYST EXCISION     rt wrist   GANGLION CYST EXCISION     TONSILLECTOMY     WISDOM TOOTH EXTRACTION     Social History   Socioeconomic History   Marital status: Married    Spouse name: Not on file   Number of children: 3   Years of education: Not on file   Highest education level: Not on file  Occupational History    Comment: retired  Tobacco Use   Smoking status: Former    Current packs/day: 0.00    Types: Cigarettes    Quit date: 11/06/1988    Years since quitting: 35.3   Smokeless tobacco: Never  Substance and Sexual Activity   Alcohol use: Yes    Comment: rare   Drug use: No   Sexual activity: Not Currently  Other Topics Concern   Not on file  Social History Narrative   Lives with husband.  Has 2 biological children & 1 adopted child.  Takes care of disables son.  Still works and does all the housework   Social Drivers of Corporate investment banker Strain: Not on file  Food Insecurity: Not on file  Transportation Needs: Not on file  Physical Activity: Not on file  Stress: Not on file  Social Connections: Unknown (10/14/2021)   Received from [facility]   Social Network    Social Network: Not on file  Intimate Partner Violence: Unknown (09/05/2021)   Received from [facility]   HITS    Physically Hurt: Not on file    Insult or Talk Down To: Not on file    Threaten Physical Harm: Not on file    Scream or Curse: Not on file   Current Outpatient Medications on File Prior to Visit  Medication Sig Dispense Refill   atenolol  (TENORMIN ) 50 MG tablet Take 1 tablet (50 mg total) by mouth daily. 90 tablet 3   Calcium   Carb-Cholecalciferol  [phone] MG-UNIT TABS Take 2 tablets by mouth daily.      denosumab  (PROLIA ) 60 MG/ML SOSY injection Inject 60 mg into the skin every 6 (six) months.     desloratadine  (CLARINEX ) 5 MG tablet Take 1 tablet (5 mg total) by mouth daily. 90 tablet 3   diphenoxylate -atropine  (LOMOTIL ) 2.5-0.025 MG per tablet Take 1 tablet by mouth as needed for diarrhea or loose stools. 30 tablet 4   loratadine  (CLARITIN ) 10 MG tablet Take 1 tablet (10 mg total) by mouth daily.  30 tablet 11   meloxicam (MOBIC) 15 MG tablet meloxicam 15 mg tablet  Take 1 tablet daily with food for 5-7 days, then as needed     mometasone  (NASONEX ) 50 MCG/ACT nasal spray Place 2 sprays into the nose daily. 17 g 12   mometasone  (NASONEX ) 50 MCG/ACT nasal spray Place 2 sprays into the nose daily. 1 each 12   temazepam  (RESTORIL ) 15 MG capsule Take 15 mg by mouth at bedtime as needed for sleep.     Vitamin D , Cholecalciferol , 25 MCG (1000 UT) CAPS Take 1,000 Units by mouth daily.     No current facility-administered medications on file prior to visit.   Allergies  Allergen Reactions   Amoxicillin Rash   Family History  Problem Relation Age of Onset   Vascular Disease Mother    Diabetes Father    Dementia Father    Lymphoma Sister    PE: BP 124/72   Pulse (!) 52   Ht 5' 4 (1.626 m)   Wt 124 lb 3.2 oz (56.3 kg)   SpO2 95%   BMI 21.32 kg/m  Wt Readings from Last 10 Encounters:  03/18/24 124 lb 3.2 oz (56.3 kg)  09/06/23 125 lb (56.7 kg)  03/30/23 126 lb (57.2 kg)  03/22/23 124 lb 12.8 oz (56.6 kg)  02/12/23 124 lb (56.2 kg)  11/10/21 127 lb 9.6 oz (57.9 kg)  05/11/21 126 lb 12.8 oz (57.5 kg)  11/10/20 126 lb 6.4 oz (57.3 kg)  10/06/20 129 lb 12.8 oz (58.9 kg)  05/12/20 126 lb 9.6 oz (57.4 kg)   Constitutional: normal weight, in NAD Eyes: EOMI, no exophthalmos ENT: no thyromegaly, no cervical lymphadenopathy Cardiovascular: irreg. Irreg. Rhythm, RR, No MRG Respiratory: CTA B Musculoskeletal:  no deformities Skin: no rashes Neurological: no tremor with outstretched hands  ASSESSMENT: 1.  Graves' disease  PLAN:  1. Patient with history of thyrotoxicosis (low TSH, high free thyroid  hormones), found during admission for A-fib with RVR.  She had thyrotoxic symptoms including mild weight loss, palpitations, shortness of breath, fatigue, insomnia, diarrhea, however, the latter was longstanding, in the setting of IBS.  TSI antibodies were elevated, pointing towards a diagnosis of Graves' disease.  She was started on methimazole  and atenolol  in 09/2019, after which she started to feel better right away.  Palpitations resolved, as did her shortness of breath.  Afterwards, we were able to decrease the methimazole  dose and at last visit she was on 2.5 mg every other day.  Since her TRAb's were undetectable and the repeat TSI titer was normal, in 03/2023, I advised her to stop methimazole .  Subsequent TFTs were normal, including at last check in 10/2023. - She continues on atenolol  50 mg daily.  We tried to decrease the dose to 25 mg daily due to weight gain in 2022 but she developed palpitations and she had to go back to the previous dose.  At last visit she was asking me whether she should continue with atenolol .  We discussed that from the thyroid  point of view, she did not need it anymore but due to the previous episode of A-fib with RVR and more recent intolerance to decreasing the dose, I recommended to check again with Dr. Lavona, who originally prescribed the medication. She did not see cardiology recently, but PCP decreased the dose to half: 25 mg daily >> doing well on this. -At today's visit she has no thyrotoxic signs or symptoms: No tremors, weight loss, heat intolerance, anxiety - She also  has no neck compression symptoms.  Of note, she had a thyroid  ultrasound in 2015 which did not show any nodules.  She does not have any masses felt on palpation of her neck today. - She has no active  signs of Graves' ophthalmopathy: No blurry vision, double vision, eye pain, chemosis - At today's visit, we will recheck her TFTs and see if we need to restart methimazole  - I will see her back in a year but possibly sooner for labs  Orders Placed This Encounter  Procedures   TSH   T4, free   T3, free   Component     Latest Ref Rng 03/18/2024  TSH     0.40 - 4.50 mIU/L 0.92   T4,Free(Direct)     0.8 - 1.8 ng/dL 1.2   Triiodothyronine,Free,Serum     2.3 - 4.2 pg/mL 3.0   Thyroid  tests are normal.  No need to restart methimazole .  Lela Fendt, MD PhD [facility]

## 2024-03-18 NOTE — Patient Instructions (Signed)
Please continue off methimazole.  Please stop at the lab.  Please come back for a follow-up appointment in 1 year. 

## 2024-03-19 ENCOUNTER — Ambulatory Visit: Payer: Self-pay | Admitting: Internal Medicine

## 2024-03-19 LAB — T4, FREE: Free T4: 1.2 ng/dL (ref 0.8–1.8)

## 2024-03-19 LAB — T3, FREE: T3, Free: 3 pg/mL (ref 2.3–4.2)

## 2024-03-19 LAB — TSH: TSH: 0.92 m[IU]/L (ref 0.40–4.50)

## 2024-03-21 ENCOUNTER — Ambulatory Visit (INDEPENDENT_AMBULATORY_CARE_PROVIDER_SITE_OTHER): Payer: Medicare HMO | Admitting: Otolaryngology

## 2024-03-28 ENCOUNTER — Ambulatory Visit (INDEPENDENT_AMBULATORY_CARE_PROVIDER_SITE_OTHER): Admitting: Otolaryngology

## 2024-03-28 ENCOUNTER — Ambulatory Visit: Payer: Medicare HMO | Admitting: Internal Medicine

## 2024-04-03 DIAGNOSIS — M810 Age-related osteoporosis without current pathological fracture: Secondary | ICD-10-CM | POA: Diagnosis not present

## 2024-04-04 DIAGNOSIS — M810 Age-related osteoporosis without current pathological fracture: Secondary | ICD-10-CM | POA: Diagnosis not present

## 2024-04-04 DIAGNOSIS — E785 Hyperlipidemia, unspecified: Secondary | ICD-10-CM | POA: Diagnosis not present

## 2024-04-08 DIAGNOSIS — M810 Age-related osteoporosis without current pathological fracture: Secondary | ICD-10-CM | POA: Diagnosis not present

## 2024-04-11 ENCOUNTER — Ambulatory Visit (INDEPENDENT_AMBULATORY_CARE_PROVIDER_SITE_OTHER): Admitting: Otolaryngology

## 2024-04-11 ENCOUNTER — Encounter (INDEPENDENT_AMBULATORY_CARE_PROVIDER_SITE_OTHER): Payer: Self-pay | Admitting: Otolaryngology

## 2024-04-11 VITALS — BP 156/73 | HR 52 | Temp 97.4°F

## 2024-04-11 DIAGNOSIS — H6123 Impacted cerumen, bilateral: Secondary | ICD-10-CM

## 2024-04-11 DIAGNOSIS — H9313 Tinnitus, bilateral: Secondary | ICD-10-CM | POA: Diagnosis not present

## 2024-04-11 DIAGNOSIS — H903 Sensorineural hearing loss, bilateral: Secondary | ICD-10-CM

## 2024-04-11 NOTE — Progress Notes (Signed)
 Reason for Consult: Fullness in the ear Referring Physician: Dr. Nanci Jacks Lynn Hayden is an 83 y.o. female.  HPI: History of eustachian tube dysfunction and cerumen impaction.  She currently is wearing hearing aids.  The hearing aids have helped a lot.  Occasionally her left ear becomes occluded and the hearing aid does not work as well.  She has no nasal congestion or obstruction.  She does not think she has eustachian tube dysfunction because she has been told that is related to her nose and she absolutely has no nasal symptoms.  She is convinced this is the wax issue.  She has also been told her left ear has a deformity.  She has no pain.  No drainage.  No vertigo.  Does have bilateral tinnitus.  Past Medical History:  Diagnosis Date   Arthritis    Chronic diarrhea    Chronic diastolic CHF (congestive heart failure) (HCC)    Hemorrhoid    Hyperthyroidism    Hypertrophic cardiomegaly    a. history of hypertrophic cardiomyopathy (mild septal focal hypertrophy) by echo in 2015 but this has not been seen on more recent echoes.   IBS (irritable bowel syndrome)    Insomnia    Miscarriage    Osteoporosis    Persistent atrial fibrillation (HCC)    Pulmonary hypertension (HCC)    Thyromegaly    Vitamin D  deficiency     Past Surgical History:  Procedure Laterality Date   CATARACT EXTRACTION     DILATION AND CURETTAGE OF UTERUS     EAR CYST EXCISION  11/09/2011   Procedure: CYST REMOVAL;  Surgeon: Franky JONELLE Curia, MD;  Location: [facility];  Service: Orthopedics;  Laterality: Right;  right index excision cyst/foreign body and debridement DIP joint   GANGLION CYST EXCISION     rt wrist   GANGLION CYST EXCISION     TONSILLECTOMY     WISDOM TOOTH EXTRACTION      Family History  Problem Relation Age of Onset   Vascular Disease Mother    Diabetes Father    Dementia Father    Lymphoma Sister     Social History:  reports that she quit smoking about 35 years ago. Her  smoking use included cigarettes. She has never used smokeless tobacco. She reports current alcohol use. She reports that she does not use drugs.  Allergies:  Allergies  Allergen Reactions   Amoxicillin Rash    Medications: I have reviewed the patient's current medications.  No results found for this or any previous visit (from the past 48 hours).  No results found.  ROS There were no vitals taken for this visit. Physical Exam Constitutional:      Appearance: Normal appearance.  HENT:     Head: Normocephalic and atraumatic.     Right Ear: see below    Nose: Nose normal. Turbinates with mild hypertrophy, No significant swelling or masses.     Oral cavity/oropharynx: Mucous membranes are moist. No lesions or masses    Larynx: normal voice. Mirror attempted without success    Eyes:     Extraocular Movements: Extraocular movements intact.     Conjunctiva/sclera: Conjunctivae normal.     Pupils: Pupils are equal, round, and reactive to light.  Cardiovascular:     Rate and Rhythm: Normal rate.  Pulmonary:     Effort: Pulmonary effort is normal.  Musculoskeletal:     Cervical back: Normal range of motion and neck supple. No rigidity.  Lymphadenopathy:  Cervical: No cervical adenopathy or masses.salivary glands without lesions. .  Neurological:     Mental Status: He is alert. CN 2-12 intact. No nystagmus  Otomicroscope procedure  I cleaned out both ears under otomicroscope direction with a curette.  She tolerated this well.  Both ears were obstructed laterally with cerumen.  The left ear does have a slight collapsing of the lateral concha cartilage but otherwise the canal is widely patent.  Both tympanic membranes look normal and middle ear is aerated.  She tolerated the procedure well.    Assessment/Plan: Cerumen impaction-I think her entire issue is cerumen.  It is probably occluding the ear canal and the hearing aid.  It does not seem like she is having eustachian tube  dysfunction.  I would recommend she come back and have the ears cleaned when she becomes symptomatic.  Today's cleaning did relieve her symptoms.  She will follow-up as needed or in 6 months.  Tinnitus-we talked about the tinnitus and there is no real effective treatment for this other than the hearing aids.  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  Norleen Notice 04/11/2024, 9:58 AM

## 2024-05-04 DIAGNOSIS — E785 Hyperlipidemia, unspecified: Secondary | ICD-10-CM | POA: Diagnosis not present

## 2024-05-04 DIAGNOSIS — M810 Age-related osteoporosis without current pathological fracture: Secondary | ICD-10-CM | POA: Diagnosis not present

## 2024-10-01 ENCOUNTER — Ambulatory Visit (INDEPENDENT_AMBULATORY_CARE_PROVIDER_SITE_OTHER): Admitting: Otolaryngology

## 2025-03-18 ENCOUNTER — Ambulatory Visit: Admitting: Internal Medicine
